# Patient Record
Sex: Female | Born: 1995
Health system: Southern US, Community
[De-identification: ages and names within clinical notes are randomized; demographics above are authoritative.]

## PROBLEM LIST (undated history)

## (undated) DIAGNOSIS — N809 Endometriosis, unspecified: Secondary | ICD-10-CM

## (undated) DIAGNOSIS — R002 Palpitations: Secondary | ICD-10-CM

## (undated) DIAGNOSIS — R0600 Dyspnea, unspecified: Secondary | ICD-10-CM

## (undated) DIAGNOSIS — J189 Pneumonia, unspecified organism: Secondary | ICD-10-CM

## (undated) DIAGNOSIS — N926 Irregular menstruation, unspecified: Secondary | ICD-10-CM

## (undated) DIAGNOSIS — Z8489 Family history of other specified conditions: Secondary | ICD-10-CM

## (undated) DIAGNOSIS — L509 Urticaria, unspecified: Secondary | ICD-10-CM

## (undated) DIAGNOSIS — R7303 Prediabetes: Secondary | ICD-10-CM

## (undated) DIAGNOSIS — K219 Gastro-esophageal reflux disease without esophagitis: Secondary | ICD-10-CM

## (undated) DIAGNOSIS — T7840XA Allergy, unspecified, initial encounter: Secondary | ICD-10-CM

## (undated) DIAGNOSIS — R519 Headache, unspecified: Secondary | ICD-10-CM

## (undated) DIAGNOSIS — R51 Headache: Secondary | ICD-10-CM

## (undated) DIAGNOSIS — D649 Anemia, unspecified: Secondary | ICD-10-CM

## (undated) HISTORY — DX: Endometriosis, unspecified: N80.9

## (undated) HISTORY — DX: Allergy, unspecified, initial encounter: T78.40XA

## (undated) HISTORY — DX: Irregular menstruation, unspecified: N92.6

## (undated) HISTORY — PX: WISDOM TOOTH EXTRACTION: SHX21

## (undated) HISTORY — DX: Headache, unspecified: R51.9

## (undated) HISTORY — DX: Headache: R51

## (undated) HISTORY — DX: Urticaria, unspecified: L50.9

## (undated) HISTORY — DX: Gastro-esophageal reflux disease without esophagitis: K21.9

---
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
## 2016-01-28 ENCOUNTER — Ambulatory Visit: Payer: 59 | Admitting: Family Medicine

## 2016-01-28 ENCOUNTER — Other Ambulatory Visit: Payer: Self-pay | Admitting: Family Medicine

## 2016-01-28 DIAGNOSIS — M2550 Pain in unspecified joint: Secondary | ICD-10-CM

## 2016-01-30 ENCOUNTER — Encounter (HOSPITAL_COMMUNITY): Payer: Self-pay | Admitting: Emergency Medicine

## 2016-01-30 ENCOUNTER — Emergency Department (HOSPITAL_COMMUNITY)
Admission: EM | Admit: 2016-01-30 | Discharge: 2016-01-31 | Disposition: A | Discharge status: Home / Self Care | Payer: Commercial Managed Care - HMO | Attending: Emergency Medicine | Admitting: Emergency Medicine

## 2016-01-30 DIAGNOSIS — M791 Myalgia: Secondary | ICD-10-CM | POA: Diagnosis not present

## 2016-01-30 DIAGNOSIS — Z5321 Procedure and treatment not carried out due to patient leaving prior to being seen by health care provider: Secondary | ICD-10-CM | POA: Insufficient documentation

## 2016-01-30 LAB — URINALYSIS, ROUTINE W REFLEX MICROSCOPIC
Bilirubin Urine: NEGATIVE
Glucose, UA: NEGATIVE mg/dL
Hgb urine dipstick: NEGATIVE
Ketones, ur: NEGATIVE mg/dL
Leukocytes, UA: NEGATIVE
Nitrite: NEGATIVE
Protein, ur: NEGATIVE mg/dL
Specific Gravity, Urine: 1.023 (ref 1.005–1.030)
pH: 6 (ref 5.0–8.0)

## 2016-01-30 LAB — COMPREHENSIVE METABOLIC PANEL
ALT: 23 U/L (ref 14–54)
AST: 24 U/L (ref 15–41)
Albumin: 4.1 g/dL (ref 3.5–5.0)
Alkaline Phosphatase: 71 U/L (ref 38–126)
Anion gap: 6 (ref 5–15)
BUN: 9 mg/dL (ref 6–20)
CO2: 23 mmol/L (ref 22–32)
Calcium: 9.6 mg/dL (ref 8.9–10.3)
Chloride: 109 mmol/L (ref 101–111)
Creatinine, Ser: 0.86 mg/dL (ref 0.44–1.00)
GFR calc Af Amer: >60 mL/min (ref >60)
GFR calc non Af Amer: >60 mL/min (ref >60)
Glucose, Bld: 91 mg/dL (ref 65–99)
Potassium: 4 mmol/L (ref 3.5–5.1)
Sodium: 138 mmol/L (ref 135–145)
Total Bilirubin: 0.4 mg/dL (ref 0.3–1.2)
Total Protein: 7.4 g/dL (ref 6.5–8.1)

## 2016-01-30 LAB — CBC WITH DIFFERENTIAL/PLATELET
Basophils Absolute: 0 10*3/uL (ref 0.0–0.1)
Basophils Relative: 0 %
Eosinophils Absolute: 0.1 10*3/uL (ref 0.0–0.7)
Eosinophils Relative: 1 %
HCT: 37.1 % (ref 36.0–46.0)
Hemoglobin: 11.5 g/dL — ABNORMAL LOW (ref 12.0–15.0)
Lymphocytes Relative: 36 %
Lymphs Abs: 3.3 10*3/uL (ref 0.7–4.0)
MCH: 25.4 pg — ABNORMAL LOW (ref 26.0–34.0)
MCHC: 31 g/dL (ref 30.0–36.0)
MCV: 82.1 fL (ref 78.0–100.0)
Monocytes Absolute: 0.7 10*3/uL (ref 0.1–1.0)
Monocytes Relative: 8 %
Neutro Abs: 5.1 10*3/uL (ref 1.7–7.7)
Neutrophils Relative %: 55 %
Platelets: 289 10*3/uL (ref 150–400)
RBC: 4.52 MIL/uL (ref 3.87–5.11)
RDW: 14.6 % (ref 11.5–15.5)
WBC: 9.2 10*3/uL (ref 4.0–10.5)

## 2016-01-30 LAB — POC URINE PREG, ED: Preg Test, Ur: NEGATIVE

## 2016-01-30 NOTE — ED Triage Notes (Signed)
Pt. reports chronic generalized body aches / joints pain for >1 year worse these past several days , denies injury ,  no fever or chills / respirations unlabored .

## 2016-01-30 NOTE — ED Notes (Signed)
Father came to desk reporting that the pt was feeling a little better and the pt just does not want to wait any longer   They will return if she gets worse

## 2016-02-01 ENCOUNTER — Encounter (HOSPITAL_COMMUNITY): Payer: Self-pay | Admitting: *Deleted

## 2016-02-04 ENCOUNTER — Other Ambulatory Visit: Payer: Self-pay | Admitting: Obstetrics and Gynecology

## 2016-02-14 ENCOUNTER — Encounter (HOSPITAL_COMMUNITY): Payer: Self-pay

## 2016-02-14 ENCOUNTER — Ambulatory Visit (HOSPITAL_COMMUNITY): Payer: Commercial Managed Care - HMO | Admitting: Certified Registered Nurse Anesthetist

## 2016-02-14 ENCOUNTER — Encounter (HOSPITAL_COMMUNITY)
Admission: RE | Disposition: A | Discharge status: Home / Self Care | Payer: Self-pay | Source: Ambulatory Visit | Attending: Obstetrics and Gynecology

## 2016-02-14 ENCOUNTER — Ambulatory Visit (HOSPITAL_COMMUNITY)
Admission: RE | Admit: 2016-02-14 | Discharge: 2016-02-14 | Disposition: A | Discharge status: Home / Self Care | Payer: Commercial Managed Care - HMO | Source: Ambulatory Visit | Attending: Obstetrics and Gynecology | Admitting: Obstetrics and Gynecology

## 2016-02-14 DIAGNOSIS — Z6841 Body Mass Index (BMI) 40.0 and over, adult: Secondary | ICD-10-CM | POA: Diagnosis not present

## 2016-02-14 DIAGNOSIS — N803 Endometriosis of pelvic peritoneum, unspecified: Secondary | ICD-10-CM

## 2016-02-14 DIAGNOSIS — K219 Gastro-esophageal reflux disease without esophagitis: Secondary | ICD-10-CM | POA: Insufficient documentation

## 2016-02-14 DIAGNOSIS — R102 Pelvic and perineal pain: Secondary | ICD-10-CM | POA: Diagnosis present

## 2016-02-14 HISTORY — PX: LAPAROSCOPY: SHX197

## 2016-02-14 LAB — CBC
HCT: 35.2 % — ABNORMAL LOW (ref 36.0–46.0)
Hemoglobin: 11.5 g/dL — ABNORMAL LOW (ref 12.0–15.0)
MCH: 25.9 pg — ABNORMAL LOW (ref 26.0–34.0)
MCHC: 32.7 g/dL (ref 30.0–36.0)
MCV: 79.3 fL (ref 78.0–100.0)
Platelets: 277 10*3/uL (ref 150–400)
RBC: 4.44 MIL/uL (ref 3.87–5.11)
RDW: 14.4 % (ref 11.5–15.5)
WBC: 7 10*3/uL (ref 4.0–10.5)

## 2016-02-14 LAB — PREGNANCY, URINE: Preg Test, Ur: NEGATIVE

## 2016-02-14 SURGERY — LAPAROSCOPY, DIAGNOSTIC
Anesthesia: General | Site: Abdomen

## 2016-02-14 MED ORDER — NEOSTIGMINE METHYLSULFATE 10 MG/10ML IV SOLN
INTRAVENOUS | Status: AC
  Filled 2016-02-14: qty 1

## 2016-02-14 MED ORDER — MIDAZOLAM HCL 2 MG/2ML IJ SOLN
INTRAMUSCULAR | Status: AC
  Filled 2016-02-14: qty 2

## 2016-02-14 MED ORDER — ONDANSETRON HCL 4 MG PO TABS: 4.0000 mg | ORAL_TABLET | Freq: Three times a day (TID) | ORAL | 0 refills | Status: DC | PRN

## 2016-02-14 MED ORDER — ROCURONIUM BROMIDE 100 MG/10ML IV SOLN
INTRAVENOUS | Status: DC | PRN
  Administered 2016-02-14: 60 mg via INTRAVENOUS

## 2016-02-14 MED ORDER — GLYCOPYRROLATE 0.2 MG/ML IJ SOLN
INTRAMUSCULAR | Status: AC
  Filled 2016-02-14: qty 3

## 2016-02-14 MED ORDER — BUPIVACAINE HCL (PF) 0.25 % IJ SOLN
INTRAMUSCULAR | Status: AC
  Filled 2016-02-14: qty 30

## 2016-02-14 MED ORDER — PROMETHAZINE HCL 25 MG/ML IJ SOLN
INTRAMUSCULAR | Status: AC
  Administered 2016-02-14: 6.25 mg via INTRAVENOUS
  Filled 2016-02-14: qty 1

## 2016-02-14 MED ORDER — ARTIFICIAL TEARS OP OINT
TOPICAL_OINTMENT | OPHTHALMIC | Status: AC
  Filled 2016-02-14: qty 3.5

## 2016-02-14 MED ORDER — FENTANYL CITRATE (PF) 250 MCG/5ML IJ SOLN
INTRAMUSCULAR | Status: AC
  Filled 2016-02-14: qty 5

## 2016-02-14 MED ORDER — SUGAMMADEX SODIUM 200 MG/2ML IV SOLN
INTRAVENOUS | Status: AC
  Filled 2016-02-14: qty 2

## 2016-02-14 MED ORDER — HYDROCODONE-ACETAMINOPHEN 5-325 MG PO TABS: 1.0000 | ORAL_TABLET | Freq: Four times a day (QID) | ORAL | 0 refills | Status: DC | PRN

## 2016-02-14 MED ORDER — DEXAMETHASONE SODIUM PHOSPHATE 10 MG/ML IJ SOLN
INTRAMUSCULAR | Status: DC | PRN
  Administered 2016-02-14: 10 mg via INTRAVENOUS

## 2016-02-14 MED ORDER — KETOROLAC TROMETHAMINE 30 MG/ML IJ SOLN
INTRAMUSCULAR | Status: DC | PRN
  Administered 2016-02-14: 30 mg via INTRAMUSCULAR
  Administered 2016-02-14: 30 mg via INTRAVENOUS

## 2016-02-14 MED ORDER — KETOROLAC TROMETHAMINE 30 MG/ML IJ SOLN
INTRAMUSCULAR | Status: AC
  Filled 2016-02-14: qty 1

## 2016-02-14 MED ORDER — LACTATED RINGERS IR SOLN
Status: DC | PRN
  Administered 2016-02-14: 3000 mL

## 2016-02-14 MED ORDER — HYDROMORPHONE HCL 1 MG/ML IJ SOLN
0.2500 mg | INTRAMUSCULAR | Status: DC | PRN
  Administered 2016-02-14: 0.5 mg via INTRAVENOUS
  Administered 2016-02-14 (×2): 0.25 mg via INTRAVENOUS
  Administered 2016-02-14: 0.5 mg via INTRAVENOUS

## 2016-02-14 MED ORDER — 0.9 % SODIUM CHLORIDE (POUR BTL) OPTIME
TOPICAL | Status: DC | PRN
  Administered 2016-02-14: 1000 mL

## 2016-02-14 MED ORDER — SCOPOLAMINE 1 MG/3DAYS TD PT72
1.0000 | MEDICATED_PATCH | Freq: Once | TRANSDERMAL | Status: DC
  Administered 2016-02-14: 1.5 mg via TRANSDERMAL

## 2016-02-14 MED ORDER — BUPIVACAINE HCL (PF) 0.25 % IJ SOLN
INTRAMUSCULAR | Status: DC | PRN
  Administered 2016-02-14: 10 mL

## 2016-02-14 MED ORDER — PROPOFOL 10 MG/ML IV BOLUS
INTRAVENOUS | Status: DC | PRN
  Administered 2016-02-14: 200 mg via INTRAVENOUS

## 2016-02-14 MED ORDER — ONDANSETRON HCL 4 MG/2ML IJ SOLN
INTRAMUSCULAR | Status: AC
  Filled 2016-02-14: qty 2

## 2016-02-14 MED ORDER — KETOROLAC TROMETHAMINE 30 MG/ML IJ SOLN: 30.0000 mg | Freq: Once | INTRAMUSCULAR | Status: DC | PRN

## 2016-02-14 MED ORDER — HYDROMORPHONE HCL 1 MG/ML IJ SOLN
INTRAMUSCULAR | Status: AC
  Filled 2016-02-14: qty 1

## 2016-02-14 MED ORDER — LIDOCAINE HCL (CARDIAC) 20 MG/ML IV SOLN
INTRAVENOUS | Status: AC
  Filled 2016-02-14: qty 5

## 2016-02-14 MED ORDER — PROPOFOL 10 MG/ML IV BOLUS
INTRAVENOUS | Status: AC
  Filled 2016-02-14: qty 20

## 2016-02-14 MED ORDER — SUGAMMADEX SODIUM 200 MG/2ML IV SOLN
INTRAVENOUS | Status: DC | PRN
  Administered 2016-02-14: 200 mg via INTRAVENOUS

## 2016-02-14 MED ORDER — MIDAZOLAM HCL 2 MG/2ML IJ SOLN
INTRAMUSCULAR | Status: DC | PRN
  Administered 2016-02-14: 2 mg via INTRAVENOUS

## 2016-02-14 MED ORDER — EPHEDRINE SULFATE 50 MG/ML IJ SOLN
INTRAMUSCULAR | Status: DC | PRN
  Administered 2016-02-14: 5 mg via INTRAVENOUS

## 2016-02-14 MED ORDER — PROMETHAZINE HCL 25 MG/ML IJ SOLN
6.2500 mg | INTRAMUSCULAR | Status: DC | PRN
  Administered 2016-02-14: 6.25 mg via INTRAVENOUS

## 2016-02-14 MED ORDER — ROCURONIUM BROMIDE 100 MG/10ML IV SOLN
INTRAVENOUS | Status: AC
  Filled 2016-02-14: qty 1

## 2016-02-14 MED ORDER — PHENYLEPHRINE HCL 10 MG/ML IJ SOLN
INTRAMUSCULAR | Status: DC | PRN
  Administered 2016-02-14: 80 ug via INTRAVENOUS
  Administered 2016-02-14: 40 ug via INTRAVENOUS

## 2016-02-14 MED ORDER — FENTANYL CITRATE (PF) 100 MCG/2ML IJ SOLN
INTRAMUSCULAR | Status: DC | PRN
  Administered 2016-02-14: 50 ug via INTRAVENOUS
  Administered 2016-02-14: 75 ug via INTRAVENOUS
  Administered 2016-02-14: 25 ug via INTRAVENOUS
  Administered 2016-02-14: 100 ug via INTRAVENOUS

## 2016-02-14 MED ORDER — ARTIFICIAL TEARS OP OINT
TOPICAL_OINTMENT | OPHTHALMIC | Status: DC | PRN
  Administered 2016-02-14: 1 via OPHTHALMIC

## 2016-02-14 MED ORDER — HYDROMORPHONE HCL 1 MG/ML IJ SOLN
INTRAMUSCULAR | Status: AC
  Administered 2016-02-14: 0.25 mg via INTRAVENOUS
  Filled 2016-02-14: qty 1

## 2016-02-14 MED ORDER — LIDOCAINE HCL (CARDIAC) 20 MG/ML IV SOLN
INTRAVENOUS | Status: DC | PRN
  Administered 2016-02-14: 60 mg via INTRAVENOUS

## 2016-02-14 MED ORDER — LACTATED RINGERS IV SOLN
INTRAVENOUS | Status: DC
  Administered 2016-02-14 (×2): 125 mL/h via INTRAVENOUS
  Administered 2016-02-14: 15:00:00 via INTRAVENOUS
  Administered 2016-02-14: 125 mL/h via INTRAVENOUS

## 2016-02-14 MED ORDER — SCOPOLAMINE 1 MG/3DAYS TD PT72
MEDICATED_PATCH | TRANSDERMAL | Status: AC
  Administered 2016-02-14: 1.5 mg via TRANSDERMAL
  Filled 2016-02-14: qty 1

## 2016-02-14 SURGICAL SUPPLY — 70 items
APPLICATOR COTTON TIP 6IN STRL (MISCELLANEOUS) ×3 IMPLANT
BARRIER ADHS 3X4 INTERCEED (GAUZE/BANDAGES/DRESSINGS) ×1 IMPLANT
BRR ADH 4X3 ABS CNTRL BYND (GAUZE/BANDAGES/DRESSINGS)
CABLE HIGH FREQUENCY MONO STRZ (ELECTRODE) ×2 IMPLANT
CATH FOLEY 3WAY  5CC 16FR (CATHETERS) ×1
CATH FOLEY 3WAY 5CC 16FR (CATHETERS) ×2 IMPLANT
CATH ROBINSON RED A/P 16FR (CATHETERS) ×3 IMPLANT
CLOTH BEACON ORANGE TIMEOUT ST (SAFETY) ×3 IMPLANT
CONT PATH 16OZ SNAP LID 3702 (MISCELLANEOUS) ×3 IMPLANT
COVER BACK TABLE 60X90IN (DRAPES) ×6 IMPLANT
COVER TIP SHEARS 8 DVNC (MISCELLANEOUS) ×1 IMPLANT
COVER TIP SHEARS 8MM DA VINCI (MISCELLANEOUS)
DECANTER SPIKE VIAL GLASS SM (MISCELLANEOUS) ×4 IMPLANT
DEFOGGER SCOPE WARMER CLEARIFY (MISCELLANEOUS) ×3 IMPLANT
DEVICE TROCAR PUNCTURE CLOSURE (ENDOMECHANICALS) ×2 IMPLANT
DRSG COVADERM PLUS 2X2 (GAUZE/BANDAGES/DRESSINGS) ×6 IMPLANT
DRSG OPSITE POSTOP 3X4 (GAUZE/BANDAGES/DRESSINGS) ×3 IMPLANT
DRSG TELFA 3X8 NADH (GAUZE/BANDAGES/DRESSINGS) ×3 IMPLANT
DURAPREP 26ML APPLICATOR (WOUND CARE) ×3 IMPLANT
ELECT LIGASURE LONG (ELECTRODE) IMPLANT
ELECT REM PT RETURN 9FT ADLT (ELECTROSURGICAL) ×3
ELECTRODE REM PT RTRN 9FT ADLT (ELECTROSURGICAL) ×2 IMPLANT
FORCEPS CUTTING 33CM 5MM (CUTTING FORCEPS) IMPLANT
FORCEPS CUTTING 45CM 5MM (CUTTING FORCEPS) IMPLANT
GAUZE VASELINE 3X9 (GAUZE/BANDAGES/DRESSINGS) IMPLANT
GLOVE BIOGEL PI IND STRL 7.0 (GLOVE) ×10 IMPLANT
GLOVE BIOGEL PI INDICATOR 7.0 (GLOVE) ×5
GLOVE ECLIPSE 6.5 STRL STRAW (GLOVE) ×9 IMPLANT
GOWN STRL REUS W/TWL LRG LVL3 (GOWN DISPOSABLE) ×9 IMPLANT
KIT ACCESSORY DA VINCI DISP (KITS) ×1
KIT ACCESSORY DVNC DISP (KITS) ×2 IMPLANT
LEGGING LITHOTOMY PAIR STRL (DRAPES) ×3 IMPLANT
LIQUID BAND (GAUZE/BANDAGES/DRESSINGS) ×3 IMPLANT
NEEDLE INSUFFLATION 120MM (ENDOMECHANICALS) ×3 IMPLANT
NS IRRIG 1000ML POUR BTL (IV SOLUTION) ×5 IMPLANT
OCCLUDER COLPOPNEUMO (BALLOONS) IMPLANT
PACK LAPAROSCOPY BASIN (CUSTOM PROCEDURE TRAY) ×3 IMPLANT
PACK ROBOT WH (CUSTOM PROCEDURE TRAY) ×3 IMPLANT
PACK ROBOTIC GOWN (GOWN DISPOSABLE) ×3 IMPLANT
PAD DRESSING TELFA 3X8 NADH (GAUZE/BANDAGES/DRESSINGS) IMPLANT
PAD PREP 24X48 CUFFED NSTRL (MISCELLANEOUS) ×6 IMPLANT
PAD TRENDELENBURG POSITION (MISCELLANEOUS) ×3 IMPLANT
SCISSORS LAP 5X35 DISP (ENDOMECHANICALS) ×2 IMPLANT
SET CYSTO W/LG BORE CLAMP LF (SET/KITS/TRAYS/PACK) IMPLANT
SET IRRIG TUBING LAPAROSCOPIC (IRRIGATION / IRRIGATOR) ×3 IMPLANT
SET TRI-LUMEN FLTR TB AIRSEAL (TUBING) ×2 IMPLANT
SLEEVE XCEL OPT CAN 5 100 (ENDOMECHANICALS) ×1 IMPLANT
SOLUTION ELECTROLUBE (MISCELLANEOUS) IMPLANT
SUT VIC AB 0 CT1 27 (SUTURE) ×15
SUT VIC AB 0 CT1 27XBRD ANTBC (SUTURE) ×10 IMPLANT
SUT VICRYL 0 UR6 27IN ABS (SUTURE) ×3 IMPLANT
SUT VICRYL 4-0 PS2 18IN ABS (SUTURE) ×4 IMPLANT
SYR 50ML LL SCALE MARK (SYRINGE) ×3 IMPLANT
SYSTEM CONVERTIBLE TROCAR (TROCAR) IMPLANT
TIP UTERINE 5.1X6CM LAV DISP (MISCELLANEOUS) IMPLANT
TIP UTERINE 6.7X10CM GRN DISP (MISCELLANEOUS) IMPLANT
TIP UTERINE 6.7X6CM WHT DISP (MISCELLANEOUS) IMPLANT
TIP UTERINE 6.7X8CM BLUE DISP (MISCELLANEOUS) IMPLANT
TOWEL OR 17X24 6PK STRL BLUE (TOWEL DISPOSABLE) ×9 IMPLANT
TROCAR BALLN 12MMX100 BLUNT (TROCAR) IMPLANT
TROCAR DISP BLADELESS 8 DVNC (TROCAR) IMPLANT
TROCAR DISP BLADELESS 8MM (TROCAR)
TROCAR OPTI TIP 12M 100M (ENDOMECHANICALS) ×1 IMPLANT
TROCAR OPTI TIP 5M 100M (ENDOMECHANICALS) ×3 IMPLANT
TROCAR PORT AIRSEAL 5X120 (TROCAR) IMPLANT
TROCAR PORT AIRSEAL 8X120 (TROCAR) ×2 IMPLANT
TROCAR XCEL DIL TIP R 11M (ENDOMECHANICALS) ×3 IMPLANT
TROCAR Z-THREAD 12X150 (TROCAR) ×1 IMPLANT
WARMER LAPAROSCOPE (MISCELLANEOUS) ×3 IMPLANT
WATER STERILE IRR 1000ML POUR (IV SOLUTION) ×3 IMPLANT

## 2016-02-14 NOTE — Anesthesia Procedure Notes (Signed)
Procedure Name: Intubation Date/Time: 02/14/2016 2:19 PM Performed by: Collier FlowersSPEIGHT, Maya Scholer J Pre-anesthesia Checklist: Patient identified, Emergency Drugs available, Timeout performed, Suction available and Patient being monitored Patient Re-evaluated:Patient Re-evaluated prior to inductionOxygen Delivery Method: Circle system utilized Preoxygenation: Pre-oxygenation with 100% oxygen Intubation Type: IV induction and Inhalational induction Ventilation: Mask ventilation without difficulty Laryngoscope Size: Mac and 3 Grade View: Grade I Tube type: Oral Tube size: 7.0 mm Number of attempts: 1 Airway Equipment and Method: Stylet Placement Confirmation: ETT inserted through vocal cords under direct vision,  breath sounds checked- equal and bilateral,  positive ETCO2 and CO2 detector Secured at: 22 cm Tube secured with: Tape Dental Injury: Teeth and Oropharynx as per pre-operative assessment

## 2016-02-14 NOTE — Discharge Instructions (Signed)
DISCHARGE INSTRUCTIONS: Laparoscopy  The following instructions have been prepared to help you care for yourself upon your return home today.  Wound care:  Do not get the incision wet for the first 24 hours. The incision should be kept clean and dry.  The Band-Aids or dressings may be removed the day after surgery.  Should the incision become sore, red, and swollen after the first week, check with your doctor.  Personal hygiene:  Shower the day after your procedure.  Activity and limitations:  Do NOT drive or operate any equipment today.  Do NOT lift anything more than 15 pounds for 2-3 weeks after surgery.  Do NOT rest in bed all day.  Walking is encouraged. Walk each day, starting slowly with 5-minute walks 3 or 4 times a day. Slowly increase the length of your walks.  Walk up and down stairs slowly.  Do NOT do strenuous activities, such as golfing, playing tennis, bowling, running, biking, weight lifting, gardening, mowing, or vacuuming for 2-4 weeks. Ask your doctor when it is okay to start.  Diet: Eat a light meal as desired this evening. You may resume your usual diet tomorrow.  Return to work: This is dependent on the type of work you do. For the most part you can return to a desk job within a week of surgery. If you are more active at work, please discuss this with your doctor.  What to expect after your surgery: You may have a slight burning sensation when you urinate on the first day. You may have a very small amount of blood in the urine. Expect to have a small amount of vaginal discharge/light bleeding for 1-2 weeks. It is not unusual to have abdominal soreness and bruising for up to 2 weeks. You may be tired and need more rest for about 1 week. You may experience shoulder pain for 24-72 hours. Lying flat in bed may relieve it.  Call your doctor for any of the following:  Develop a fever of 100.4 or greater  Inability to urinate 6 hours after discharge from hospital  Severe  pain not relieved by pain medications  Persistent of heavy bleeding at incision site  Redness or swelling around incision site after a week  Increasing nausea or vomiting  Patient Signature________________________________________ Nurse Signature_________________________________________ Post Anesthesia Home Care Instructions  Activity: Get plenty of rest for the remainder of the day. A responsible adult should stay with you for 24 hours following the procedure.  For the next 24 hours, DO NOT: -Drive a car -Operate machinery -Drink alcoholic beverages -Take any medication unless instructed by your physician -Make any legal decisions or sign important papers.  Meals: Start with liquid foods such as gelatin or soup. Progress to regular foods as tolerated. Avoid greasy, spicy, heavy foods. If nausea and/or vomiting occur, drink only clear liquids until the nausea and/or vomiting subsides. Call your physician if vomiting continues.  Special Instructions/Symptoms: Your throat may feel dry or sore from the anesthesia or the breathing tube placed in your throat during surgery. If this causes discomfort, gargle with warm salt water. The discomfort should disappear within 24 hours.  If you had a scopolamine patch placed behind your ear for the management of post- operative nausea and/or vomiting:  1. The medication in the patch is effective for 72 hours, after which it should be removed.  Wrap patch in a tissue and discard in the trash. Wash hands thoroughly with soap and water. 2. You may remove the patch   earlier than 72 hours if you experience unpleasant side effects which may include dry mouth, dizziness or visual disturbances. 3. Avoid touching the patch. Wash your hands with soap and water after contact with the patch.   

## 2016-02-14 NOTE — Anesthesia Preprocedure Evaluation (Addendum)
Anesthesia Evaluation  Patient identified by MRN, date of birth, ID band Patient awake    Reviewed: Allergy & Precautions, NPO status , Patient's Chart, lab work & pertinent test results  Airway Mallampati: II  TM Distance: >3 FB Neck ROM: Full    Dental no notable dental hx.    Pulmonary neg pulmonary ROS,    Pulmonary exam normal breath sounds clear to auscultation       Cardiovascular negative cardio ROS Normal cardiovascular exam Rhythm:Regular Rate:Normal     Neuro/Psych negative neurological ROS  negative psych ROS   GI/Hepatic Neg liver ROS, GERD  Medicated,  Endo/Other  Morbid obesity  Renal/GU negative Renal ROS  negative genitourinary   Musculoskeletal negative musculoskeletal ROS (+)   Abdominal   Peds negative pediatric ROS (+)  Hematology negative hematology ROS (+)   Anesthesia Other Findings   Reproductive/Obstetrics negative OB ROS                            Anesthesia Physical Anesthesia Plan  ASA: II  Anesthesia Plan: General   Post-op Pain Management:    Induction: Intravenous  Airway Management Planned: Oral ETT  Additional Equipment:   Intra-op Plan:   Post-operative Plan: Extubation in OR  Informed Consent: I have reviewed the patients History and Physical, chart, labs and discussed the procedure including the risks, benefits and alternatives for the proposed anesthesia with the patient or authorized representative who has indicated his/her understanding and acceptance.   Dental advisory given  Plan Discussed with: CRNA and Surgeon  Anesthesia Plan Comments:         Anesthesia Quick Evaluation

## 2016-02-14 NOTE — Brief Op Note (Signed)
02/14/2016  4:46 PM  PATIENT:  Hannah Ford  20 y.o. female  PRE-OPERATIVE DIAGNOSIS:  Pelvic Pain  POST-OPERATIVE DIAGNOSIS:  PELVIC PAIN, stage 1 Pelvic endometriosis  PROCEDURE:  Diagnostic laparoscopy, fulguration of pelvic endometriosis  SURGEON:  Surgeon(s) and Role:    * Maxie BetterSheronette Jaremy Nosal, MD - Primary  PHYSICIAN ASSISTANT:   ASSISTANTS: Arlan Organaniela Paul, CNM   ANESTHESIA:   general   Findings: nl tubes and ovaries, ant cul de sac endometriosis, appendix not seen, prominent GB, nl liver edge, Right post cul de sac ? Scarring from endometriosis  EBL:  Total I/O In: 1800 [I.V.:1800] Out: 710 [Urine:700; Blood:10]  BLOOD ADMINISTERED:none  DRAINS: none   LOCAL MEDICATIONS USED:  MARCAINE     SPECIMEN:  Source of Specimen:  bladder peritoneum endometriosis  DISPOSITION OF SPECIMEN:  PATHOLOGY  COUNTS:  YES  TOURNIQUET:  * No tourniquets in log *  DICTATION: .Other Dictation: Dictation Number [id number]  PLAN OF CARE: Discharge to home after PACU  PATIENT DISPOSITION:  PACU - guarded condition.   Delay start of Pharmacological VTE agent (>24hrs) due to surgical blood loss or risk of bleeding: no

## 2016-02-14 NOTE — Anesthesia Procedure Notes (Signed)
Central Venous Catheter Insertion Performed by: anesthesiologist Patient location: Pre-op. Preanesthetic checklist: patient identified, IV checked, site marked, risks and benefits discussed, surgical consent, monitors and equipment checked, pre-op evaluation, timeout performed and anesthesia consent Position: reverse Trendelenburg Lidocaine 1% used for infiltration Landmarks identified Catheter size: 8 Fr Central line was placed.Double lumen Procedure performed using ultrasound guided technique. Attempts: 1 Following insertion, dressing applied and line sutured. Post procedure assessment: blood return through all ports. Patient tolerated the procedure well with no immediate complications.

## 2016-02-14 NOTE — Transfer of Care (Signed)
Immediate Anesthesia Transfer of Care Note  Patient: Hannah Ford  Procedure(s) Performed: Procedure(s) with comments: LAPAROSCOPY DIAGNOSTIC FULGERATION OF PELVIC ENDOMETRIOSIS (N/A) - @ 2hrs.  Patient Location: PACU  Anesthesia Type:General  Level of Consciousness: sedated  Airway & Oxygen Therapy: Patient Spontanous Breathing and Patient connected to nasal cannula oxygen  Post-op Assessment: Report given to RN  Post vital signs: Reviewed and stable  Last Vitals:  Vitals:   02/14/16 1150  BP: (!) 144/78  Pulse: (!) 0  Resp: 20  Temp: 36.9 C    Last Pain:  Vitals:   02/14/16 1150  TempSrc: Oral  PainSc: 5       Patients Stated Pain Goal: 5 (02/14/16 1150)  Complications: No apparent anesthesia complications

## 2016-02-15 NOTE — Anesthesia Postprocedure Evaluation (Signed)
Anesthesia Post Note  Patient: Hannah Ford  Procedure(s) Performed: Procedure(s) (LRB): LAPAROSCOPY DIAGNOSTIC FULGERATION OF PELVIC ENDOMETRIOSIS (N/A)  Patient location during evaluation: PACU Anesthesia Type: General Level of consciousness: awake and alert, oriented and patient cooperative Pain management: pain level controlled (pain improving) Vital Signs Assessment: post-procedure vital signs reviewed and stable Respiratory status: spontaneous breathing, nonlabored ventilation, respiratory function stable and patient connected to nasal cannula oxygen Cardiovascular status: blood pressure returned to baseline and stable : nausea improved. Anesthetic complications: no Comments: Delayed entry: pt eval in PACU post op    Last Vitals:  Vitals:   02/14/16 1909 02/14/16 1935  BP: 136/84 (!) 141/78  Pulse: (!) 109 (!) 101  Resp: 17 16  Temp:  37.1 C    Last Pain:  Vitals:   02/15/16 1004  TempSrc:   PainSc: 6                  Ted Leonhart,E. Cadie Sorci

## 2016-02-15 NOTE — Op Note (Signed)
NAME:  Hannah Ford, Hannah                   ACCOUNT NO.:  [id number]  MEDICAL RECORD NO.:  [id number]  LOCATION:  [facility]                          FACILITY:  [facility]  PHYSICIAN:  Maxie BetterSheronette Jayleen Afonso, M.D.DATE OF BIRTH:  26-Oct-1995  DATE OF PROCEDURE:  02/14/2016 DATE OF DISCHARGE:  02/14/2016                              OPERATIVE REPORT   PREOPERATIVE DIAGNOSIS:  Pelvic pain.  PROCEDURE:  Diagnostic laparoscopy, fulguration of pelvic endometriosis.  POSTOPERATIVE DIAGNOSIS:  Pelvic pain, stage I pelvic endometriosis.  ANESTHESIA:  General.  SURGEON:  Maxie BetterSheronette Ryota Treece, M.D.  ASSISTANT:  Arlan Organaniela Paul, CNM  DESCRIPTION OF PROCEDURE:  Under adequate general anesthesia, the patient was placed in the dorsal lithotomy position.  She was sterilely prepped and draped in the usual fashion.  An indwelling Foley catheter was sterilely placed.  A small bivalve speculum was placed in the vagina.  Single-tooth tenaculum was placed on anterior lip of the cervix.  An acorn cannula was introduced in the cervix and attached to tenaculum for manipulation of the uterus.  Attention was then turned to the abdomen.  0.25% Marcaine were injected infraumbilically. Infraumbilical incision was made.  Veress needle was introduced.  2.6 L of CO2 was insufflated.  Veress needle was then removed.  A 10 mm disposable trocar with sleeve was introduced into the abdomen without incident. A lighted video laparoscope was inserted and entered the abdomen without any injury.  Panoramic inspection revealed normal liver edge.  The gallbladder was seen.  The patient was placed in Trendelenburg position.  Initial inspection appeared unremarkable. Therefore, a 2nd incision was placed suprapubically and one in the left lower quadrant.  Further inspection close to the uterus, noted for some red endometriotic appearing lesions over the anterior cul-de-sac. Posterior cul-de-sac without any lesions.  Both tubes and ovaries were normal.   Through the left lower quadrant port, water was placed with a needle and the peritoneum was injected with fluid.  Using hot scissors, a window of peritoneum with endometriotic implants was carefully removed.  Good hemostasis was noted.   The other remaining endometrial implants were fulgurated.  Once everything appeared without any incident, the suprapubic and left lower quadrant site was removed.  The infraumbilical port site was removed taking care not  to bring up any underlying structures.  The incision was closed with 4-0 Vicryl subcuticular closure and 0 Vicryl for the rectus fascia. Specimen was bladder peritoneum with endometriosis.  ESTIMATED BLOOD LOSS:  Minimal.  COMPLICATION:  None.  The patient tolerated the procedure well and was transferred to recovery in stable condition.     Maxie BetterSheronette Moriah Loughry, M.D.     [place]/MEDQ  D:  02/14/2016  T:  02/15/2016  Job:  [id number]

## 2016-02-17 ENCOUNTER — Encounter (HOSPITAL_COMMUNITY): Payer: Self-pay | Admitting: Obstetrics and Gynecology

## 2016-02-26 ENCOUNTER — Other Ambulatory Visit: Payer: Self-pay | Admitting: Neurology

## 2016-02-26 DIAGNOSIS — R29898 Other symptoms and signs involving the musculoskeletal system: Secondary | ICD-10-CM

## 2016-02-26 DIAGNOSIS — M542 Cervicalgia: Secondary | ICD-10-CM

## 2016-03-04 ENCOUNTER — Ambulatory Visit: Payer: Commercial Managed Care - HMO

## 2016-04-22 ENCOUNTER — Telehealth: Payer: Self-pay | Admitting: Family Medicine

## 2016-04-22 DIAGNOSIS — R109 Unspecified abdominal pain: Secondary | ICD-10-CM

## 2016-04-22 NOTE — Telephone Encounter (Signed)
Referral placed.

## 2016-04-22 NOTE — Telephone Encounter (Signed)
Mom called in for daughter with stomach pain.  Mom would like to get referral [facility] in [place]  [facility]. [facility].   cb number is [phone]

## 2016-04-23 ENCOUNTER — Encounter: Payer: Self-pay | Admitting: Physician Assistant

## 2016-04-28 ENCOUNTER — Encounter: Payer: Self-pay | Admitting: Physician Assistant

## 2016-04-28 ENCOUNTER — Ambulatory Visit (INDEPENDENT_AMBULATORY_CARE_PROVIDER_SITE_OTHER): Payer: Commercial Managed Care - HMO | Admitting: Physician Assistant

## 2016-04-28 VITALS — BP 112/60 | HR 96 | Ht 66.0 in | Wt 245.2 lb

## 2016-04-28 DIAGNOSIS — R1013 Epigastric pain: Secondary | ICD-10-CM | POA: Diagnosis not present

## 2016-04-28 DIAGNOSIS — K5909 Other constipation: Secondary | ICD-10-CM | POA: Diagnosis not present

## 2016-04-28 DIAGNOSIS — K625 Hemorrhage of anus and rectum: Secondary | ICD-10-CM

## 2016-04-28 DIAGNOSIS — R630 Anorexia: Secondary | ICD-10-CM | POA: Diagnosis not present

## 2016-04-28 DIAGNOSIS — R110 Nausea: Secondary | ICD-10-CM | POA: Diagnosis not present

## 2016-04-28 NOTE — Progress Notes (Signed)
Chief Complaint: Constipation, bright red blood per rectum, abdominal pain, nausea  HPI:  Hannah Ford is a very pleasant 20 year old African-American female, with past medical history of endometriosis and GERD who was referred to me by Dianne Dun, MD for a complaint of abdominal pain, constipation, bright red blood per rectum and nausea. The patient has never been seen in our clinic previously.   Today, the patient does present to clinic accompanied by her mother who does assist some of her history. The patient describes that around November to December of last year she started noticing abnormal periods and went to see a gynecologist. She was also sent to a gastroenterologist at the [facility] clinic for nausea and some epigastric pressure around that same time. The patient tells me she was diagnosed with GERD and started on Omeprazole 40 mg daily which was "too strong for me", this was then decreased to 20 mg and after a few months she started feeling better, so she stopped this earlier this year. The patient continued with some lower abdominal pain and symptoms and had an exploratory laparotomy in August of this year for suspected endometriosis which was positive. Since that time the patient has had a decreased appetite only eating 1-2 small meals per day. She believes this is mostly due to the fact that she has an epigastric abdominal pain around 30-40 minutes after she eats or drinks anything, this tends to radiate across her upper abdomen and lasts for a few hours. The patient has never tried taking any medicine during these times, she just "suffers through it". She describes this as a sharp pain rated as 8-10/10 which will turn into a cramp. Around that time she also feels as though she needs to have a bowel movement, but will sit and strain on the toilet and sometimes only produces a small thin stool. She does tell me her bowel movements have been slightly more regular over the past week or so, but are  still not normal for her. She no longer uses her Omeprazole. She has tried using MiraLAX in the past, which helped, but she has not taken this recently either. Associated symptoms included one episode of bright red blood with a bowel movement a couple of weeks ago.   Patient believes she has lost some weight, according to our flow charts patient has lost around 10 pounds since August of this year. Her mother is mainly concerned regarding her lack of appetite.   Patient has been started on birth control since time of her laporoscopy. Patient denies fever, chills, melena, fatigue, vomiting, heartburn, reflux or increase in gas or bloating.   Past Medical History:  Diagnosis Date  . Abnormal menstrual periods   . Allergy   . Frequent headaches    otc med prn  . GERD (gastroesophageal reflux disease)     Past Surgical History:  Procedure Laterality Date  . LAPAROSCOPY N/A 02/14/2016   Procedure: LAPAROSCOPY DIAGNOSTIC FULGERATION OF PELVIC ENDOMETRIOSIS;  Surgeon: Maxie Better, MD;  Location: [facility];  Service: Gynecology;  Laterality: N/A;  @ 2hrs.  . WISDOM TOOTH EXTRACTION      Current Outpatient Prescriptions  Medication Sig Dispense Refill  . Cyanocobalamin (VITAMIN B 12 PO) Take 1 capsule by mouth daily.    . Multiple Vitamin (MULTIVITAMIN) capsule Take 1 capsule by mouth daily.     No current facility-administered medications for this visit.     Allergies as of 04/28/2016 - Review Complete 04/28/2016  Allergen Reaction Noted  .  Zithromax [azithromycin] Other (See Comments) 05/21/2015  . Penicillins Rash 06/07/2011    Family History  Problem Relation Age of Onset  . Hyperlipidemia Maternal Aunt   . Heart disease Maternal Grandmother   . Hypertension Maternal Grandfather     Social History   Social History  . Marital status: Single    Spouse name: N/A  . Number of children: N/A  . Years of education: N/A   Occupational History  . Not on file.   Social History  Main Topics  . Smoking status: Never Smoker  . Smokeless tobacco: Never Used  . Alcohol use No  . Drug use: No  . Sexual activity: No   Other Topics Concern  . Not on file   Social History Narrative  . No narrative on file    Review of Systems:     Constitutional: As if her weight loss of 10 pounds since August of this year No fever, chills, weakness or fatigue HEENT: Eyes: No change in vision               Ears, Nose, Throat:  No change in hearing or congestion Skin: No rash or itching Cardiovascular: No chest pain, chest pressure or palpitations   Respiratory: No SOB or cough Gastrointestinal: See HPI and otherwise negative Genitourinary: No dysuria or change in urinary frequency Neurological: No headache, dizziness or syncope Musculoskeletal: No new muscle or joint pain Hematologic: No bruising Psychiatric: No history of depression or anxiety   Physical Exam:  Vital signs: BP 112/60   Pulse 96   Ht 5\' 6"  (1.676 m)   Wt 245 lb 3.2 oz (111.2 kg)   BMI 39.58 kg/m   General:Very pleasant young African-American female appears to be in NAD, Well developed, Well nourished, alert and cooperative Head:  Normocephalic and atraumatic. Eyes:   PEERL, EOMI. No icterus. Conjunctiva pink. Ears:  Normal auditory acuity. Neck:  Supple Throat: Oral cavity and pharynx without inflammation, swelling or lesion. Teeth in good condition. Lungs: Respirations even and unlabored. Lungs clear to auscultation bilaterally.   No wheezes, crackles, or rhonchi.  Heart: Normal S1, S2. No MRG. Regular rate and rhythm. No peripheral edema, cyanosis or pallor.  Abdomen:   obese, Soft, non-distended, moderate epigastric tenderness to palpation No rebound or guarding. Normal bowel sounds. No appreciable masses or hepatomegaly. Rectal:   external exam: Normal, no mass or lesion; internal exam: Limited as the patient was very tense and had a tight sphincter  Msk:  Symmetrical without gross deformities.  Peripheral pulses intact.  Extremities:  Without edema, no deformity or joint abnormality. Normal ROM. Neurologic:  Alert and  oriented x4;  grossly normal neurologically.  Skin:   Dry and intact without significant lesions or rashes. Psychiatric: Oriented to person, place and time. Demonstrates good judgement and reason without abnormal affect or behaviors  Most recent labs and imagingCBC    Component Value Date/Time   WBC 7.0 02/14/2016 1150   RBC 4.44 02/14/2016 1150   HGB 11.5 (L) 02/14/2016 1150   HCT 35.2 (L) 02/14/2016 1150   PLT 277 02/14/2016 1150   MCV 79.3 02/14/2016 1150   MCH 25.9 (L) 02/14/2016 1150   MCHC 32.7 02/14/2016 1150   RDW 14.4 02/14/2016 1150   LYMPHSABS 3.3 01/30/2016 2049   MONOABS 0.7 01/30/2016 2049   EOSABS 0.1 01/30/2016 2049   BASOSABS 0.0 01/30/2016 2049    CMP     Component Value Date/Time   NA 138 01/30/2016 2049  K 4.0 01/30/2016 2049   CL 109 01/30/2016 2049   CO2 23 01/30/2016 2049   GLUCOSE 91 01/30/2016 2049   BUN 9 01/30/2016 2049   CREATININE 0.86 01/30/2016 2049   CALCIUM 9.6 01/30/2016 2049   PROT 7.4 01/30/2016 2049   ALBUMIN 4.1 01/30/2016 2049   AST 24 01/30/2016 2049   ALT 23 01/30/2016 2049   ALKPHOS 71 01/30/2016 2049   BILITOT 0.4 01/30/2016 2049   GFRNONAA >60 01/30/2016 2049   GFRAA >60 01/30/2016 2049   EXAM: US ABDOMEN LIMITED - RIGHT UPPER QUADRANT 11/16/15  COMPARISON:  Abdominal pelvic CT scan of January 19, 2015  FINDINGS: Gallbladder:  No gallstones or wall thickening visualized. No sonographic Murphy sign noted by sonographer.  Common bile duct:  Diameter: 3.0 mm.  Liver:  The hepatic echotexture is mildly increased diffusely. There is no focal mass or ductal dilation. The surface contour of the liver is normal.  IMPRESSION: Probable fatty infiltrative change of the liver. No gallstones nor evidence of acute cholecystitis. If there is strong clinical concern of gallbladder  dysfunction, a nuclear medicine hepatobiliary scan would be a useful next imaging step.   Electronically Signed   By: David  SwazilandJordan M.D.   On: 11/16/2015 09:06  Assessment: 1. Epigastric abdominal pain: Occurs 30-40 minutes after eating or drinking, patient had improvement on omeprazole 20 mg in the past; likely gastritis versus GERD versus all bladder etiology, the patient did have a right upper quadrant ultrasound in the recent past which was normal, if symptoms continue and EGD was normal could consider HIDA scan for further eval 2. Nausea: Somewhat decreased since time of laparoscopic procedure, likely related to above and endometriosis 3. Constipation: Patient describes straining for thin bowel movements ever since procedure in September 4. Bright red blood per rectum: One episode, patient uncertain if this was blood or "cherry juice", with history of constipation and normal rectal exam, likely represents hemorrhoidal bleeding, discussed with the family that we cannot rule out anything more serious, but at this time would recommend for further observation 5. Decreased appetite: Since time of procedure, consider relation to epigastric pain  Plan: 1. At this time discussed conservative versus other approaches with the patient and her mother. They are willing to try conservative measures first. 2. Recommend the patient restart her Omeprazole 20 mg daily, 30-60 minutes before eating. Did discuss that she can increase this to 40 mg a day if she does not feel like this is helping very much 3. Reviewed antireflux diet and lifestyle measures. 4. Recommend the patient restart MiraLAX daily, did discuss increasing fiber and water as well as exercise on a daily basis 5. Explained to the patient and her mother that at time of follow-up in 2-3 weeks, if the patient has not seen any improvement from these conservative measures, we can consider endoscopic evaluation with an EGD plus/minus colonoscopy 6.  Patient should let us know if her symptoms increase or worsen or if she sees an increase in frequency or further bright red blood in her bowel movements 7. Patient to follow up in clinic with myself or Dr. Lavon PaganiniNandigam, as she is the supervising physician today in 2-3 weeks.   Hyacinth MeekerJennifer Felicitas Sine, PA-C  Gastroenterology 04/28/2016, 11:17 AM  Cc: Dianne DunAron, Talia M, MD

## 2016-04-28 NOTE — Patient Instructions (Addendum)
Start Miralax daily. Start the Omeprazole 20 mg daily, take 30-60 min prior to breakfast.   We have provided you with a High Fiber handout.  We made you a follow up appointment with Hyacinth MeekerJennifer Lemmon PA-C/PP Date: 05-12-2016 at 11:15 am.

## 2016-05-01 NOTE — Progress Notes (Signed)
Reviewed and agree with documentation and assessment and plan. K. Veena Nandigam , MD   

## 2016-05-02 ENCOUNTER — Telehealth: Payer: Self-pay | Admitting: Physician Assistant

## 2016-05-02 NOTE — Telephone Encounter (Signed)
Please tell patient to use MiraLax qd or BID. It sounds like she is experiencing overflow constipation. Stones are likely hard stools. Thanks-JLL

## 2016-05-02 NOTE — Telephone Encounter (Signed)
The pt is calling and states that her pain is no better and may be worse.  She says she is having some loose stools with "pieces of hard matter, some of which resembles stones"  She is taking omeprazole daily, she tried miralax but not daily as recommened.  Has an appt with Victorino DikeJennifer on 05/12/16.  Please advise

## 2016-05-02 NOTE — Telephone Encounter (Signed)
Pt has been notified of Jennifer's recommendations and will call on Monday if no better

## 2016-05-07 ENCOUNTER — Telehealth: Payer: Self-pay | Admitting: Family Medicine

## 2016-05-07 NOTE — Telephone Encounter (Signed)
Patient Name: Hannah Ford DOB: 19-Jan-1996 Initial Comment Her granddaughter is spotting, cramping and was recently diagnosed with endometrioses. She is on birth control and she feels like something is going on in the GI area. She feels like her granddaughter is depressed because no one is really helping her. Nurse Assessment Nurse: Charna Elizabethrumbull, RN, Cathy Date/Time (Eastern Time): 05/07/2016 10:47:12 AM Confirm and document reason for call. If symptomatic, describe symptoms. You must click the next button to save text entered. ---Misty StanleyLaura, Gramdmother, states [place] is not with her and she is unable to answer triage questions. She shares [place] was recently diagnosed with Endometriosis, but continues to have spotting and abdominal pain. She feels that [place] is becoming frustrated and depressed because of the ongoing symptoms. She will call [place] and have her call back for triage evaluation. Does the patient have any new or worsening symptoms? ---Yes Will a triage be completed? ---No Select reason for no triage. ---Other Guidelines Guideline Title Affirmed Question Affirmed Notes Final Disposition User Clinical Call Pagerumbull, RN, Lynden Angathy

## 2016-05-07 NOTE — Telephone Encounter (Signed)
Patient Name: Hannah Ford  DOB: 08/11/1995    Initial Comment She has been having sharp pains and swelling in her upper stomach, lack of appetite and vaginal bleeding. Every time she eats she gets a really bad pain in her stomach and she feels nauseous.    Nurse Assessment  Nurse: Annye Englisharmon, RN, Denise Date/Time (Eastern Time): 05/07/2016 1:34:09 PM  Confirm and document reason for call. If symptomatic, describe symptoms. You must click the next button to save text entered. ---Pt w/upper abd pain, recent diag GERD, seen by GI last wk, taking Omeprazole, but med not helping. Abd swollen and nauseated.  Does the patient have any new or worsening symptoms? ---Yes  Will a triage be completed? ---Yes  Related visit to physician within the last 2 weeks? ---Yes  Does the PT have any chronic conditions? (i.e. diabetes, asthma, etc.) ---Yes  List chronic conditions. ---GERD  Is the patient pregnant or possibly pregnant? (Ask all females between the ages of 4612-55) ---No  Is this a behavioral health or substance abuse call? ---No     Guidelines    Guideline Title Affirmed Question Affirmed Notes  Abdominal Pain - Upper Patient sounds very sick or weak to the triager    Final Disposition User   Go to ED Now (or PCP triage) Annye Englisharmon, RN, Angelique Blonderenise    Comments  No appts avail at the PCP office. [facility]. She states she is pain free at this time, but when she eats/drinks anything, she gets severe pain (10/10) in her upper abdomen. States she is resistent to eat/drink anything at all and has recent hx of dehydration. Advised to be seen at ER due to no appts avail at [facility], but [facility] refused [facility]   Referrals  [facility]  [facility]   Disagree/Comply: Comply

## 2016-05-12 ENCOUNTER — Ambulatory Visit (INDEPENDENT_AMBULATORY_CARE_PROVIDER_SITE_OTHER): Payer: Commercial Managed Care - HMO | Admitting: Physician Assistant

## 2016-05-12 ENCOUNTER — Encounter: Payer: Self-pay | Admitting: Physician Assistant

## 2016-05-12 VITALS — BP 120/70 | HR 78 | Ht 67.0 in | Wt 241.0 lb

## 2016-05-12 DIAGNOSIS — K5909 Other constipation: Secondary | ICD-10-CM

## 2016-05-12 DIAGNOSIS — K625 Hemorrhage of anus and rectum: Secondary | ICD-10-CM | POA: Diagnosis not present

## 2016-05-12 DIAGNOSIS — R630 Anorexia: Secondary | ICD-10-CM | POA: Diagnosis not present

## 2016-05-12 DIAGNOSIS — R110 Nausea: Secondary | ICD-10-CM | POA: Diagnosis not present

## 2016-05-12 NOTE — Progress Notes (Signed)
Chief Complaint: Abdominal pain, constipation, bright red blood per rectum and nausea  HPI:  Hannah Ford is a very pleasant 20 year old African-American female with a past medical history of endometriosis and GERD who was last seen on 04/28/2016 by me. She returns to clinic today for follow-up of her abdominal pain, constipation, bright red blood per rectum and nausea.    At that visit conservative versus more invasive measures were discussed. The patient and her mother opted for conservative measures including restarting her Omeprazole 20 mg daily as well as MiraLAX.   Today, the patient presents to clinic alone. She tells me that all of her symptoms are much better. She has not seen any further bright red blood when having a bowel movement. She does tell me she had diarrhea for about a week after seeing me and did also notice some "chunks of undigested apple" in my feces. Initially these were thought to be "stones" and the patient had called our clinic, but upon further inspection this was apple. This has also stopped happening. Patient denies any constipation, and in fact she was not constipated at time of last appointment, though she did not describe this to me. She never started her MiraLAX. Patient's epigastric pain is gone and she has also stopped her Omeprazole accordingly. She has been off Omeprazole for about a week with no further symptoms.   Patient denies fever, chills, blood in her stool, melena, change in bowel habits, fatigue, anorexia, nausea, vomiting, heartburn, reflux or abdominal pain.    Past Medical History:  Diagnosis Date  . Abnormal menstrual periods   . Allergy   . Frequent headaches    otc med prn  . GERD (gastroesophageal reflux disease)     Past Surgical History:  Procedure Laterality Date  . LAPAROSCOPY N/A 02/14/2016   Procedure: LAPAROSCOPY DIAGNOSTIC FULGERATION OF PELVIC ENDOMETRIOSIS;  Surgeon: Maxie BetterSheronette Cousins, MD;  Location: [facility];  Service: Gynecology;   Laterality: N/A;  @ 2hrs.  . WISDOM TOOTH EXTRACTION      Current Outpatient Prescriptions  Medication Sig Dispense Refill  . Cyanocobalamin (VITAMIN B 12 PO) Take 1 capsule by mouth daily.    . Multiple Vitamin (MULTIVITAMIN) capsule Take 1 capsule by mouth daily.     No current facility-administered medications for this visit.     Allergies as of 05/12/2016 - Review Complete 04/28/2016  Allergen Reaction Noted  . Zithromax [azithromycin] Other (See Comments) 05/21/2015  . Penicillins Rash 06/07/2011    Family History  Problem Relation Age of Onset  . Hyperlipidemia Maternal Aunt     x 2  . Heart disease Maternal Grandmother   . Hypertension Maternal Grandmother   . Hypertension Maternal Grandfather   . Colon cancer Neg Hx   . Esophageal cancer Neg Hx   . Stomach cancer Neg Hx   . Pancreatic cancer Neg Hx   . Liver disease Neg Hx   . Inflammatory bowel disease Neg Hx     Social History   Social History  . Marital status: Single    Spouse name: N/A  . Number of children: N/A  . Years of education: N/A   Occupational History  . Not on file.   Social History Main Topics  . Smoking status: Never Smoker  . Smokeless tobacco: Never Used  . Alcohol use No  . Drug use: No  . Sexual activity: No   Other Topics Concern  . Not on file   Social History Narrative  . No narrative  on file    Review of Systems:     Constitutional: No weight loss, fever, chills, weakness or fatigue Respiratory: No SOB Gastrointestinal: See HPI and otherwise negative   Physical Exam:  Vital signs: BP 120/70   Pulse 78   Ht 5\' 7"  (1.702 m)   Wt 241 lb (109.3 kg)   BMI 37.75 kg/m    Constitutional:   Pleasant African American female appears to be in NAD, Well developed, Well nourished, alert and cooperative Respiratory: Respirations even and unlabored. Lungs clear to auscultation bilaterally.   No wheezes, crackles, or rhonchi.  Cardiovascular: Normal S1, S2. No MRG. Regular  rate and rhythm. No peripheral edema, cyanosis or pallor.  Gastrointestinal:  Soft, nondistended, nontender. No rebound or guarding. Normal bowel sounds. No appreciable masses or hepatomegaly. Psychiatric: Demonstrates good judgement and reason without abnormal affect or behaviors.  RELEVANT LABS AND IMAGING: CBC    Component Value Date/Time   WBC 7.0 02/14/2016 1150   RBC 4.44 02/14/2016 1150   HGB 11.5 (L) 02/14/2016 1150   HCT 35.2 (L) 02/14/2016 1150   PLT 277 02/14/2016 1150   MCV 79.3 02/14/2016 1150   MCH 25.9 (L) 02/14/2016 1150   MCHC 32.7 02/14/2016 1150   RDW 14.4 02/14/2016 1150   LYMPHSABS 3.3 01/30/2016 2049   MONOABS 0.7 01/30/2016 2049   EOSABS 0.1 01/30/2016 2049   BASOSABS 0.0 01/30/2016 2049    CMP     Component Value Date/Time   NA 138 01/30/2016 2049   K 4.0 01/30/2016 2049   CL 109 01/30/2016 2049   CO2 23 01/30/2016 2049   GLUCOSE 91 01/30/2016 2049   BUN 9 01/30/2016 2049   CREATININE 0.86 01/30/2016 2049   CALCIUM 9.6 01/30/2016 2049   PROT 7.4 01/30/2016 2049   ALBUMIN 4.1 01/30/2016 2049   AST 24 01/30/2016 2049   ALT 23 01/30/2016 2049   ALKPHOS 71 01/30/2016 2049   BILITOT 0.4 01/30/2016 2049   GFRNONAA >60 01/30/2016 2049   GFRAA >60 01/30/2016 2049    Assessment: 1. Epigastric abdominal pain: No further pain after a month of omeprazole 20 mg daily, likely this represented gastritis 2. Nausea: No further nausea, with above 3. Constipation: No further constipation 4. Bright red blood per rectum: No further episodes of bright red blood per rectum 5. Decreased appetite: Patient continues with a slightly decreased appetite, but tells me that this is because she is trying to continue to lose some weight  Plan: 1. Discussed with the patient that if she develops epigastric pain in the future, she could trial Omeprazole 20 mg daily for a month, if this does not seem to control her symptoms then she should call our clinic 2. Patient to follow  with us as needed in the future.  Hyacinth MeekerJennifer Kyrsten Deleeuw, PA-C [facility] 05/12/2016, 11:18 AM  Cc: Dianne DunAron, Talia M, MD

## 2016-05-12 NOTE — Progress Notes (Signed)
Reviewed and agree with documentation and assessment and plan. K. Veena Cledith Abdou , MD   

## 2016-05-12 NOTE — Patient Instructions (Signed)
   Glad your doing better.     Follow up as needed.    I appreciate the opportunity to care for you.

## 2016-05-20 ENCOUNTER — Telehealth: Payer: Self-pay

## 2016-05-20 NOTE — Telephone Encounter (Signed)
PLEASE NOTE: All timestamps contained within this report are represented as [place] Standard Time. CONFIDENTIALTY NOTICE: This fax transmission is intended only for the addressee. It contains information that is legally privileged, confidential or otherwise protected from use or disclosure. If you are not the intended recipient, you are strictly prohibited from reviewing, disclosing, copying using or disseminating any of this information or taking any action in reliance on or regarding this information. If you have received this fax in error, please notify us immediately by telephone so that we can arrange for its return to us. Phone: [phone], Toll-Free: [phone], Fax: [phone] Page: 1 of 1 Call Id: [id number] [facility] - Client Nonclinical Telephone Record [facility] - [facility] Client Site  [facility] Day Physician Ruthe MannanAron, Talia - MD Contact Type Call Who Is Calling Patient / Member / Family / Caregiver Caller Name Hannah Ford Caller Phone Number [phone] Patient Name Hannah Ford Call Type Message Only Information Provided Reason for Call Request to Schedule Office Appointment Initial Comment Caller states that she has been having abdominal pain and back pain and she doesn't know what she should do. She is wanting to know if she can get an appointment or if she has to have a specialist. Call Closed By: Hal HopeAndrea Gray Transaction Date/Time: 05/20/2016 1:42:40 PM (ET)

## 2016-05-20 NOTE — Telephone Encounter (Signed)
Pt has appt to see Dr Dayton MartesAron on 05/21/16 at 11:45.

## 2016-05-21 ENCOUNTER — Ambulatory Visit (INDEPENDENT_AMBULATORY_CARE_PROVIDER_SITE_OTHER): Payer: Commercial Managed Care - HMO | Admitting: Family Medicine

## 2016-05-21 ENCOUNTER — Encounter: Payer: Self-pay | Admitting: Family Medicine

## 2016-05-21 VITALS — BP 120/78 | HR 117 | Temp 98.4°F | Wt 243.2 lb

## 2016-05-21 DIAGNOSIS — R319 Hematuria, unspecified: Secondary | ICD-10-CM | POA: Insufficient documentation

## 2016-05-21 DIAGNOSIS — R310 Gross hematuria: Secondary | ICD-10-CM

## 2016-05-21 DIAGNOSIS — M549 Dorsalgia, unspecified: Secondary | ICD-10-CM | POA: Insufficient documentation

## 2016-05-21 LAB — POC URINALSYSI DIPSTICK (AUTOMATED)
Bilirubin, UA: NEGATIVE
Glucose, UA: NEGATIVE
Ketones, UA: NEGATIVE
Leukocytes, UA: NEGATIVE
Nitrite, UA: NEGATIVE
Protein, UA: NEGATIVE
Spec Grav, UA: 1.01
Urobilinogen, UA: 0.2
pH, UA: 6

## 2016-05-21 NOTE — Addendum Note (Signed)
Addended by: Desmond DikeKNIGHT, Jahking Lesser H on: 05/21/2016 12:21 PM   Modules accepted: Orders

## 2016-05-21 NOTE — Progress Notes (Signed)
Pre visit review using our clinic review tool, if applicable. No additional management support is needed unless otherwise documented below in the visit note. 

## 2016-05-21 NOTE — Assessment & Plan Note (Signed)
With gross hematuria and hematuria on UA. Send urine for cx- no indication based on urine dip that this is a UTI. CT renal stone ordered. The patient indicates understanding of these issues and agrees with the plan.

## 2016-05-21 NOTE — Progress Notes (Signed)
Subjective:   Patient ID: Hannah Ford, female    DOB: 14-Mar-1996, 20 y.o.   MRN: [id number]  Hannah Ford is a pleasant 20 y.o. year old female with h/o endometriosis, GERD who presents to clinic today with her grandmother for Back Pain  on 05/21/2016  HPI:  Complains of abdominal and back pain for several days. She also had gross hematuria without dysuria yesterday.  Pain does seem to localize to left back and radiate to left suprapubic abdominal area.  Last CT of abd in 2016.   Saw GI last week- 05/12/16 for this complaint and rectal bleeding.  Note reviewed.  According to the note, her abdominal pain related to GERD had resolved with omeprazole and no further work up indicated for that or the rectal bleeding which had also resolved.  Current Outpatient Prescriptions on File Prior to Visit  Medication Sig Dispense Refill  . Cyanocobalamin (VITAMIN B 12 PO) Take 1 capsule by mouth daily.    . Multiple Vitamin (MULTIVITAMIN) capsule Take 1 capsule by mouth daily.    . norgestimate-ethinyl estradiol (SPRINTEC 28) 0.25-35 MG-MCG tablet Take 1 tablet by mouth daily.     No current facility-administered medications on file prior to visit.     Allergies  Allergen Reactions  . Zithromax [Azithromycin] Other (See Comments)    Face redness and swelling  . Penicillins Rash    Has patient had a PCN reaction causing immediate rash, facial/tongue/throat swelling, SOB or lightheadedness with hypotension: Yes Has patient had a PCN reaction causing severe rash involving mucus membranes or skin necrosis: Yes Has patient had a PCN reaction that required hospitalization No Has patient had a PCN reaction occurring within the last 10 years: No If all of the above answers are "NO", then may proceed with Cephalosporin use.     Past Medical History:  Diagnosis Date  . Abnormal menstrual periods   . Allergy   . Frequent headaches    otc med prn  . GERD (gastroesophageal reflux disease)      Past Surgical History:  Procedure Laterality Date  . LAPAROSCOPY N/A 02/14/2016   Procedure: LAPAROSCOPY DIAGNOSTIC FULGERATION OF PELVIC ENDOMETRIOSIS;  Surgeon: Maxie BetterSheronette Cousins, MD;  Location: [facility];  Service: Gynecology;  Laterality: N/A;  @ 2hrs.  . WISDOM TOOTH EXTRACTION      Family History  Problem Relation Age of Onset  . Hyperlipidemia Maternal Aunt     x 2  . Heart disease Maternal Grandmother   . Hypertension Maternal Grandmother   . Hypertension Maternal Grandfather   . Colon cancer Neg Hx   . Esophageal cancer Neg Hx   . Stomach cancer Neg Hx   . Pancreatic cancer Neg Hx   . Liver disease Neg Hx   . Inflammatory bowel disease Neg Hx     Social History   Social History  . Marital status: Single    Spouse name: N/A  . Number of children: 0  . Years of education: N/A   Occupational History  . unemployed    Social History Main Topics  . Smoking status: Never Smoker  . Smokeless tobacco: Never Used  . Alcohol use No  . Drug use: No  . Sexual activity: No   Other Topics Concern  . Not on file   Social History Narrative  . No narrative on file   The PMH, PSH, Social History, Family History, Medications, and allergies have been reviewed in [facility], and have been updated if relevant.  Review of Systems  Constitutional: Negative.   Respiratory: Negative.   Cardiovascular: Negative.   Gastrointestinal: Negative.  Negative for nausea and vomiting.  Genitourinary: Positive for flank pain. Negative for dysuria, frequency, vaginal discharge and vaginal pain.  Musculoskeletal: Positive for back pain.  Hematological: Negative.   Psychiatric/Behavioral: Negative.   All other systems reviewed and are negative.      Objective:    BP 120/78   Pulse (!) 117   Temp 98.4 F (36.9 C) (Oral)   Wt 243 lb 4 oz (110.3 kg)   SpO2 99%   BMI 38.10 kg/m    Physical Exam  Constitutional: She is oriented to person, place, and time. She appears  well-developed and well-nourished. No distress.  HENT:  Head: Normocephalic and atraumatic.  Eyes: Conjunctivae are normal.  Cardiovascular: Normal rate.   Pulmonary/Chest: Effort normal.  Abdominal: Soft. She exhibits no distension. There is no tenderness. There is no rebound and no guarding.  Musculoskeletal: Normal range of motion.  Neurological: She is alert and oriented to person, place, and time. No cranial nerve deficit.  Skin: Skin is warm and dry. She is not diaphoretic.  Psychiatric: She has a normal mood and affect. Her behavior is normal. Judgment and thought content normal.  Nursing note and vitals reviewed.         Assessment & Plan:   Hematuria, unspecified type - Plan: POCT Urinalysis Dipstick (Automated)  Gross hematuria - Plan: CT RENAL STONE STUDY  Back pain, unspecified back location, unspecified back pain laterality, unspecified chronicity - Plan: CT RENAL STONE STUDY No Follow-up on file.

## 2016-05-21 NOTE — Patient Instructions (Signed)
Great to see you. Please stop by to see Hannah Ford on your way out.   

## 2016-05-22 ENCOUNTER — Emergency Department
Admission: EM | Admit: 2016-05-22 | Discharge: 2016-05-22 | Disposition: A | Discharge status: Home / Self Care | Payer: Commercial Managed Care - HMO | Attending: Emergency Medicine | Admitting: Emergency Medicine

## 2016-05-22 ENCOUNTER — Encounter: Payer: Self-pay | Admitting: Emergency Medicine

## 2016-05-22 DIAGNOSIS — M545 Low back pain, unspecified: Secondary | ICD-10-CM

## 2016-05-22 LAB — BASIC METABOLIC PANEL
Anion gap: 8 (ref 5–15)
BUN: 6 mg/dL (ref 6–20)
CO2: 23 mmol/L (ref 22–32)
Calcium: 9.4 mg/dL (ref 8.9–10.3)
Chloride: 108 mmol/L (ref 101–111)
Creatinine, Ser: 0.91 mg/dL (ref 0.44–1.00)
GFR calc Af Amer: >60 mL/min (ref >60)
GFR calc non Af Amer: >60 mL/min (ref >60)
Glucose, Bld: 107 mg/dL — ABNORMAL HIGH (ref 65–99)
Potassium: 3.8 mmol/L (ref 3.5–5.1)
Sodium: 139 mmol/L (ref 135–145)

## 2016-05-22 LAB — URINALYSIS, COMPLETE (UACMP) WITH MICROSCOPIC
Bilirubin Urine: NEGATIVE
Glucose, UA: NEGATIVE mg/dL
Ketones, ur: NEGATIVE mg/dL
Leukocytes, UA: NEGATIVE
Nitrite: NEGATIVE
Protein, ur: NEGATIVE mg/dL
Specific Gravity, Urine: 1.012 (ref 1.005–1.030)
pH: 6 (ref 5.0–8.0)

## 2016-05-22 LAB — CBC
HCT: 37.3 % (ref 35.0–47.0)
Hemoglobin: 12.2 g/dL (ref 12.0–16.0)
MCH: 26.6 pg (ref 26.0–34.0)
MCHC: 32.7 g/dL (ref 32.0–36.0)
MCV: 81.3 fL (ref 80.0–100.0)
Platelets: 219 10*3/uL (ref 150–440)
RBC: 4.59 MIL/uL (ref 3.80–5.20)
RDW: 14.7 % — ABNORMAL HIGH (ref 11.5–14.5)
WBC: 5.7 10*3/uL (ref 3.6–11.0)

## 2016-05-22 LAB — POCT PREGNANCY, URINE: Preg Test, Ur: NEGATIVE

## 2016-05-22 LAB — URINE CULTURE: Organism ID, Bacteria: NO GROWTH

## 2016-05-22 MED ORDER — IBUPROFEN 800 MG PO TABS
800.0000 mg | ORAL_TABLET | Freq: Once | ORAL | Status: AC
  Administered 2016-05-22: 800 mg via ORAL
  Filled 2016-05-22: qty 1

## 2016-05-22 NOTE — Discharge Instructions (Signed)
For your pain, you may alternate Tylenol and Motrin. For your height and weight, you may take up to 1000 mg of Tylenol every 8 hours, and 800 mg of Motrin every 8 hours as needed. Please take Motrin with food.  You may also use a heating pad for 10 minutes every 2 hours.  Return to the emergency department if you develop severe pain, fever, fainting, or any other symptoms concerning to you.

## 2016-05-22 NOTE — ED Provider Notes (Signed)
[facility] Emergency Department Provider Note  ____________________________________________  Time seen: Approximately 2:19 PM  I have reviewed the triage vital signs and the nursing notes.   HISTORY  Chief Complaint Flank Pain    HPI Green[place] B Azucena KubaReid is a 20 y.o. female with obesity, history of endometriosis, presenting with low back pain. The patient reports that for the last several months she has had a dull ache in the low back that is constant. Occasionally she has exacerbations but does not attribute this to exertion, lifting.  It is not associated with dysuria, frequency, sharp shooting pains into the legs. She denies any hematuria, fever or chills. No nausea or vomiting. No diarrhea. She has tried 500 mg of Tylenol and a heating pad with only minimal relief. She has seen her primary care physician for this same pain, and is scheduled for CT renal stone protocol tomorrow. Today the pain became "unbearable" but is unchanged in character.  No longer menstruates that she takes oral contraceptive pills without breakthrough bleed for her endometriosis.  FH: Positive for endometriosis and renal stones in her mother.  Past Medical History:  Diagnosis Date  . Abnormal menstrual periods   . Allergy   . Frequent headaches    otc med prn  . GERD (gastroesophageal reflux disease)     Patient Active Problem List   Diagnosis Date Noted  . Hematuria 05/21/2016  . Back pain 05/21/2016  . Polyarthralgia 11/19/2015  . Chest pain 11/19/2015  . GERD (gastroesophageal reflux disease) 11/19/2015  . Arthralgia 11/14/2015  . Menorrhagia 02/20/2015    Past Surgical History:  Procedure Laterality Date  . LAPAROSCOPY N/A 02/14/2016   Procedure: LAPAROSCOPY DIAGNOSTIC FULGERATION OF PELVIC ENDOMETRIOSIS;  Surgeon: Maxie BetterSheronette Cousins, MD;  Location: [facility];  Service: Gynecology;  Laterality: N/A;  @ 2hrs.  . WISDOM TOOTH EXTRACTION      Current Outpatient Rx  . Order #:  [id number] Class: Historical Med  . Order #: [id number] Class: Historical Med  . Order #: [id number] Class: Historical Med    Allergies Zithromax [azithromycin] and Penicillins  Family History  Problem Relation Age of Onset  . Hyperlipidemia Maternal Aunt     x 2  . Heart disease Maternal Grandmother   . Hypertension Maternal Grandmother   . Hypertension Maternal Grandfather   . Colon cancer Neg Hx   . Esophageal cancer Neg Hx   . Stomach cancer Neg Hx   . Pancreatic cancer Neg Hx   . Liver disease Neg Hx   . Inflammatory bowel disease Neg Hx     Social History Social History  Substance Use Topics  . Smoking status: Never Smoker  . Smokeless tobacco: Never Used  . Alcohol use No    Review of Systems Constitutional: No fever/chills.No lightheadedness or syncope. Eyes: No visual changes. ENT: No sore throat. No congestion or rhinorrhea. Cardiovascular: Denies chest pain. Denies palpitations. Respiratory: Denies shortness of breath.  No cough. Gastrointestinal: No abdominal pain.  No nausea, no vomiting.  No diarrhea.  No constipation. Genitourinary: Negative for dysuria. No hematuria. Musculoskeletal: Positive for bilateral low back pain. Skin: Negative for rash. Neurological: Negative for headaches. No focal numbness, tingling or weakness.   10-point ROS otherwise negative.  ____________________________________________   PHYSICAL EXAM:  VITAL SIGNS: ED Triage Vitals  Enc Vitals Group     BP 05/22/16 1228 123/68     Pulse Rate 05/22/16 1228 84     Resp 05/22/16 1228 18     Temp 05/22/16 1228 98.8  F (37.1 C)     Temp Source 05/22/16 1228 Oral     SpO2 05/22/16 1228 100 %     Weight 05/22/16 1228 245 lb (111.1 kg)     Height 05/22/16 1228 5\' 7"  (1.702 m)     Head Circumference --      Peak Flow --      Pain Score 05/22/16 1235 9     Pain Loc --      Pain Edu? --      Excl. in GC? --     Constitutional: Alert and oriented. Well appearing and in no acute  distress. Answers questions appropriately. Eyes: Conjunctivae are normal.  EOMI. No scleral icterus. Head: Atraumatic. Nose: No congestion/rhinnorhea. Mouth/Throat: Mucous membranes are moist.  Neck: No stridor.  Supple.   Cardiovascular: Normal rate, regular rhythm. No murmurs, rubs or gallops.  Respiratory: Normal respiratory effort.  No accessory muscle use or retractions. Lungs CTAB.  No wheezes, rales or ronchi. Gastrointestinal: Overweight. Soft, nontender and nondistended.  No guarding or rebound.  No peritoneal signs. Musculoskeletal: No LE edema. Tenderness to palpation bilaterally, not in the midline, over the iliac crests in the back without focality. No overlying skin changes. Neurologic:  A&Ox3.  Speech is clear.  Face and smile are symmetric.  EOMI.  Moves all extremities well. Normal gait without ataxia or significant discomfort. Skin:  Skin is warm, dry and intact. No rash noted. Psychiatric: Mood and affect are normal. Speech and behavior are normal.  Normal judgement.  ____________________________________________   LABS (all labs ordered are listed, but only abnormal results are displayed)  Labs Reviewed  URINALYSIS, COMPLETE (UACMP) WITH MICROSCOPIC - Abnormal; Notable for the following:       Result Value   Color, Urine YELLOW (*)    APPearance HAZY (*)    Hgb urine dipstick LARGE (*)    Bacteria, UA FEW (*)    Squamous Epithelial / LPF 0-5 (*)    All other components within normal limits  BASIC METABOLIC PANEL - Abnormal; Notable for the following:    Glucose, Bld 107 (*)    All other components within normal limits  CBC - Abnormal; Notable for the following:    RDW 14.7 (*)    All other components within normal limits  POC URINE PREG, ED  POCT PREGNANCY, URINE   ____________________________________________  EKG  Not indicated ____________________________________________  RADIOLOGY  No results  found.  ____________________________________________   PROCEDURES  Procedure(s) performed: None  Procedures  Critical Care performed: No ____________________________________________   INITIAL IMPRESSION / ASSESSMENT AND PLAN / ED COURSE  Pertinent labs & imaging results that were available during my care of the patient were reviewed by me and considered in my medical decision making (see chart for details).  20 y.o. female with several months of bilateral low back pain. At that her primary care physician's office and here, her urine dip has hemoglobin, but her red blood cell count is only 0-5, and there is no evidence of infection in her urine. This patient's symptoms are not consistent with acute renal colic, pyelonephritis or UTI. I have talked to her about multiple possible etiologies, including strain in her back from being overweight, musculoskeletal strain. I do think she should proceed with the CT imaging scheduled by her primary care physician. Given that her symptoms have been going on for so long and are grossly unchanged, will treat her symptomatically here now that her vital signs, examination, laboratory studies are reassuring. She  will follow-up with her primary care physician after her imaging. She understands return precautions as well as follow-up instructions.  ____________________________________________  FINAL CLINICAL IMPRESSION(S) / ED DIAGNOSES  Final diagnoses:  Acute bilateral low back pain without sciatica    Clinical Course       NEW MEDICATIONS STARTED DURING THIS VISIT:  New Prescriptions   No medications on file      Rockne MenghiniAnne-Caroline Clariza Sickman, MD 05/22/16 1425

## 2016-05-22 NOTE — ED Notes (Signed)
Pt educated on rechecking blood pressure due to sl elevation.  I told her to ask at her appt tomorrow to have checked again and discussed normal parameters.

## 2016-05-22 NOTE — ED Triage Notes (Signed)
Pt comes into the ED via POV c/o bilateral flank pain.  Patient states this has been going on for over a month but it has increasingly gotten worse.  Patient was scheduled for a CT tomorrow but the patient is unable to tolerate the pain until then.  Denies any h/o kidney stones or recurrent UTI's.   Denies any frequent urination or discomfort when urinating.

## 2016-05-23 ENCOUNTER — Ambulatory Visit (INDEPENDENT_AMBULATORY_CARE_PROVIDER_SITE_OTHER)
Admission: RE | Admit: 2016-05-23 | Discharge: 2016-05-23 | Disposition: A | Discharge status: Home / Self Care | Payer: Commercial Managed Care - HMO | Source: Ambulatory Visit | Attending: Family Medicine | Admitting: Family Medicine

## 2016-05-23 DIAGNOSIS — M549 Dorsalgia, unspecified: Secondary | ICD-10-CM

## 2016-05-23 DIAGNOSIS — R310 Gross hematuria: Secondary | ICD-10-CM | POA: Diagnosis not present

## 2016-05-26 ENCOUNTER — Encounter: Payer: Self-pay | Admitting: Family Medicine

## 2016-06-23 ENCOUNTER — Encounter: Payer: Self-pay | Admitting: Family Medicine

## 2016-07-01 ENCOUNTER — Telehealth: Payer: Self-pay | Admitting: Physician Assistant

## 2016-07-04 NOTE — Telephone Encounter (Signed)
Left message on machine to call back  

## 2016-07-10 ENCOUNTER — Encounter: Payer: Self-pay | Admitting: Family Medicine

## 2016-07-10 NOTE — Telephone Encounter (Signed)
Left message with family  Member to return call

## 2016-07-24 DIAGNOSIS — G43511 Persistent migraine aura without cerebral infarction, intractable, with status migrainosus: Secondary | ICD-10-CM | POA: Diagnosis not present

## 2016-09-03 ENCOUNTER — Telehealth: Payer: Self-pay

## 2016-09-03 DIAGNOSIS — R1084 Generalized abdominal pain: Secondary | ICD-10-CM

## 2016-09-03 NOTE — Telephone Encounter (Signed)
pts mom left v/m requesting (DPR signed) referral; pt still experiencing stomach pains; Dr Dayton MartesAron had previously advised pt to go to GI but pts mom did not feel like GI dr  helped to decide what is causing stomach pain. [facility] GI [facility]; if Dr Dayton MartesAron will do pts mom will bring necessary paperwork for [facility]pts mom wants pt to see Dr L[facility]li DollarMillie Long [facility].

## 2016-09-04 NOTE — Telephone Encounter (Signed)
Referral placed.

## 2016-09-08 ENCOUNTER — Telehealth: Payer: Self-pay | Admitting: Family Medicine

## 2016-09-08 NOTE — Telephone Encounter (Signed)
Hannah Ford, can you please call pt to discuss the status of the referral. Thanks

## 2016-09-08 NOTE — Telephone Encounter (Signed)
Pt mother, Hannah Ford, called to discuss a referral for [facility]. She has been to [facility] to see Dr. Jari FavreLemmons but has been unhappy with the care and feels she is not listening to her and her symptoms. She doesn't feel like it is GERD. Pt is losing weight and feels down because when she eats it causes her to lay in bed in pain. Please advise.

## 2016-09-09 NOTE — Telephone Encounter (Signed)
Thank you :)

## 2016-09-09 NOTE — Telephone Encounter (Signed)
Called patients mother Judeth CornfieldStephanie, filled out [facility] referral form and [facility]. Patients mother aware that they will be called [facility] with the Appt info.

## 2016-09-15 ENCOUNTER — Ambulatory Visit: Payer: Self-pay | Admitting: Family Medicine

## 2016-09-24 ENCOUNTER — Ambulatory Visit (INDEPENDENT_AMBULATORY_CARE_PROVIDER_SITE_OTHER): Payer: Commercial Managed Care - HMO | Admitting: Family Medicine

## 2016-09-24 DIAGNOSIS — J358 Other chronic diseases of tonsils and adenoids: Secondary | ICD-10-CM | POA: Insufficient documentation

## 2016-09-24 NOTE — Progress Notes (Signed)
Subjective:   Patient ID: Hannah Ford, female    DOB: 1996/05/31, 21 y.o.   MRN: [id number]  Hannah Ford is a pleasant 21 y.o. year old female who presents to clinic today with Acute Visit (very sore espically while eating-- Large pustles -- ? tonsil stones)  on 09/24/2016  HPI:  Tonsil stones- for months, she has big white chunks in her tonsils that she can see and pull out. Also uses a water pic.  They smell bad.  She worries her breath smells too.  Has also had more sore throats lately and wonders if this is related.  More nasal drainage.  No current outpatient prescriptions on file prior to visit.   No current facility-administered medications on file prior to visit.     Allergies  Allergen Reactions  . Zithromax [Azithromycin] Other (See Comments)    Face redness and swelling  . Penicillins Rash    Has patient had a PCN reaction causing immediate rash, facial/tongue/throat swelling, SOB or lightheadedness with hypotension: Yes Has patient had a PCN reaction causing severe rash involving mucus membranes or skin necrosis: Yes Has patient had a PCN reaction that required hospitalization No Has patient had a PCN reaction occurring within the last 10 years: No If all of the above answers are "NO", then may proceed with Cephalosporin use.     Past Medical History:  Diagnosis Date  . Abnormal menstrual periods   . Allergy   . Frequent headaches    otc med prn  . GERD (gastroesophageal reflux disease)     Past Surgical History:  Procedure Laterality Date  . LAPAROSCOPY N/A 02/14/2016   Procedure: LAPAROSCOPY DIAGNOSTIC FULGERATION OF PELVIC ENDOMETRIOSIS;  Surgeon: Maxie Better, MD;  Location: [facility];  Service: Gynecology;  Laterality: N/A;  @ 2hrs.  . WISDOM TOOTH EXTRACTION      Family History  Problem Relation Age of Onset  . Hyperlipidemia Maternal Aunt     x 2  . Heart disease Maternal Grandmother   . Hypertension Maternal Grandmother   . Hypertension  Maternal Grandfather   . Colon cancer Neg Hx   . Esophageal cancer Neg Hx   . Stomach cancer Neg Hx   . Pancreatic cancer Neg Hx   . Liver disease Neg Hx   . Inflammatory bowel disease Neg Hx     Social History   Social History  . Marital status: Single    Spouse name: N/A  . Number of children: 0  . Years of education: N/A   Occupational History  . unemployed    Social History Main Topics  . Smoking status: Never Smoker  . Smokeless tobacco: Never Used  . Alcohol use No  . Drug use: No  . Sexual activity: No   Other Topics Concern  . Not on file   Social History Narrative  . No narrative on file   The PMH, PSH, Social History, Family History, Medications, and allergies have been reviewed in [facility], [facility], [facility], and have been updated if relevant.   Review of Systems  Constitutional: Negative.   HENT: Positive for postnasal drip and sore throat. Negative for mouth sores, sinus pain, sinus pressure, tinnitus, trouble swallowing and voice change.   All other systems reviewed and are negative.      Objective:    BP 122/78   Pulse 89   Temp 98.4 F (36.9 C)   Wt 227 lb (103 kg)   SpO2 99%   BMI 35.55 kg/m  Physical Exam  Constitutional: She appears well-developed and well-nourished. No distress.  HENT:  Head: Normocephalic and atraumatic.  Right Ear: Hearing and tympanic membrane normal.  Left Ear: Hearing and tympanic membrane normal.  Mouth/Throat: Uvula is midline and oropharynx is clear and moist. No oropharyngeal exudate, posterior oropharyngeal edema or posterior oropharyngeal erythema.  Eyes: Conjunctivae are normal.  Skin: She is not diaphoretic.  Nursing note and vitals reviewed.         Assessment & Plan:   Tonsil stone - Plan: Ambulatory referral to ENT No Follow-up on file.

## 2016-09-24 NOTE — Assessment & Plan Note (Signed)
No stones or other abnormalities on exam. Will refer to ENT for further evaluation. The patient indicates understanding of these issues and agrees with the plan.

## 2016-09-24 NOTE — Patient Instructions (Signed)
Good to see you. Please stop by to see Marion on your way out. 

## 2016-10-21 DIAGNOSIS — J358 Other chronic diseases of tonsils and adenoids: Secondary | ICD-10-CM | POA: Diagnosis not present

## 2016-10-21 DIAGNOSIS — J343 Hypertrophy of nasal turbinates: Secondary | ICD-10-CM | POA: Diagnosis not present

## 2016-11-09 ENCOUNTER — Encounter (HOSPITAL_COMMUNITY): Payer: Self-pay

## 2016-11-09 ENCOUNTER — Ambulatory Visit (HOSPITAL_COMMUNITY)
Admission: EM | Admit: 2016-11-09 | Discharge: 2016-11-09 | Disposition: A | Discharge status: Nursing Home (Includes ICF) | Payer: Commercial Managed Care - HMO | Attending: Family Medicine | Admitting: Family Medicine

## 2016-11-09 ENCOUNTER — Encounter (HOSPITAL_COMMUNITY): Payer: Self-pay | Admitting: Emergency Medicine

## 2016-11-09 ENCOUNTER — Emergency Department (HOSPITAL_COMMUNITY)
Admission: EM | Admit: 2016-11-09 | Discharge: 2016-11-09 | Disposition: A | Discharge status: Home / Self Care | Payer: Commercial Managed Care - HMO | Attending: Emergency Medicine | Admitting: Emergency Medicine

## 2016-11-09 ENCOUNTER — Emergency Department (HOSPITAL_COMMUNITY): Payer: Commercial Managed Care - HMO

## 2016-11-09 DIAGNOSIS — R0789 Other chest pain: Secondary | ICD-10-CM

## 2016-11-09 DIAGNOSIS — R079 Chest pain, unspecified: Secondary | ICD-10-CM | POA: Diagnosis not present

## 2016-11-09 DIAGNOSIS — K2100 Gastro-esophageal reflux disease with esophagitis, without bleeding: Secondary | ICD-10-CM

## 2016-11-09 DIAGNOSIS — R0602 Shortness of breath: Secondary | ICD-10-CM | POA: Diagnosis not present

## 2016-11-09 DIAGNOSIS — R072 Precordial pain: Secondary | ICD-10-CM | POA: Insufficient documentation

## 2016-11-09 LAB — BASIC METABOLIC PANEL
Anion gap: 12 (ref 5–15)
BUN: 5 mg/dL — ABNORMAL LOW (ref 6–20)
CO2: 19 mmol/L — ABNORMAL LOW (ref 22–32)
Calcium: 10.4 mg/dL — ABNORMAL HIGH (ref 8.9–10.3)
Chloride: 108 mmol/L (ref 101–111)
Creatinine, Ser: 0.88 mg/dL (ref 0.44–1.00)
GFR calc Af Amer: >60 mL/min (ref >60)
GFR calc non Af Amer: >60 mL/min (ref >60)
Glucose, Bld: 97 mg/dL (ref 65–99)
Potassium: 3.4 mmol/L — ABNORMAL LOW (ref 3.5–5.1)
Sodium: 139 mmol/L (ref 135–145)

## 2016-11-09 LAB — CBC
HCT: 41 % (ref 36.0–46.0)
Hemoglobin: 13.4 g/dL (ref 12.0–15.0)
MCH: 26.7 pg (ref 26.0–34.0)
MCHC: 32.7 g/dL (ref 30.0–36.0)
MCV: 81.8 fL (ref 78.0–100.0)
Platelets: 235 10*3/uL (ref 150–400)
RBC: 5.01 MIL/uL (ref 3.87–5.11)
RDW: 13.6 % (ref 11.5–15.5)
WBC: 7.5 10*3/uL (ref 4.0–10.5)

## 2016-11-09 LAB — I-STAT TROPONIN, ED: Troponin i, poc: 0 ng/mL (ref 0.00–0.08)

## 2016-11-09 LAB — D-DIMER, QUANTITATIVE: D-Dimer, Quant: 0.27 ug/mL-FEU (ref 0.00–0.50)

## 2016-11-09 MED ORDER — FAMOTIDINE 20 MG PO TABS
20.0000 mg | ORAL_TABLET | Freq: Once | ORAL | Status: AC
  Administered 2016-11-09: 20 mg via ORAL
  Filled 2016-11-09: qty 1

## 2016-11-09 MED ORDER — ACETAMINOPHEN 500 MG PO TABS
1000.0000 mg | ORAL_TABLET | Freq: Once | ORAL | Status: AC
  Administered 2016-11-09: 1000 mg via ORAL
  Filled 2016-11-09: qty 2

## 2016-11-09 MED ORDER — ALUM & MAG HYDROXIDE-SIMETH 200-200-20 MG/5ML PO SUSP
15.0000 mL | Freq: Once | ORAL | Status: AC
  Administered 2016-11-09: 15 mL via ORAL
  Filled 2016-11-09: qty 30

## 2016-11-09 MED ORDER — PANTOPRAZOLE SODIUM 40 MG PO TBEC: 40.0000 mg | DELAYED_RELEASE_TABLET | Freq: Every day | ORAL | 0 refills | Status: DC

## 2016-11-09 MED ORDER — POTASSIUM CHLORIDE CRYS ER 20 MEQ PO TBCR: 20.0000 meq | EXTENDED_RELEASE_TABLET | Freq: Once | ORAL | Status: DC

## 2016-11-09 NOTE — ED Provider Notes (Signed)
[facility]    CSN: [id number] Arrival date & time: 11/09/16  1745     History   Chief Complaint Chief Complaint  Patient presents with  . Chest Pain    HPI Hannah Ford is a 21 y.o. female.   Pt c/o intermittent CP onset this am associated w/dizziness, numbness/tingly of left arm, left shoulder/neck pain, nauseas  Patient has had this kind of problem in the past and she's also had a panic attack. Nevertheless she's never had a scan for pulmonary emboli and she does have left leg pain at this point.  Patient's initial pain happened at about 1 AM this morning. She had trouble sleeping after the pain began but she didn't was able eventually to doze off. She had another episode at 6 AM and again at 8 AM and has continued to have chest pressure, left arm pain, shortness of breath, nausea, and leg pain all day.       Past Medical History:  Diagnosis Date  . Abnormal menstrual periods   . Allergy   . Frequent headaches    otc med prn  . GERD (gastroesophageal reflux disease)     Patient Active Problem List   Diagnosis Date Noted  . Tonsil stone 09/24/2016  . Hematuria 05/21/2016  . Back pain 05/21/2016  . Polyarthralgia 11/19/2015  . Chest pain 11/19/2015  . GERD (gastroesophageal reflux disease) 11/19/2015  . Arthralgia 11/14/2015  . Menorrhagia 02/20/2015    Past Surgical History:  Procedure Laterality Date  . LAPAROSCOPY N/A 02/14/2016   Procedure: LAPAROSCOPY DIAGNOSTIC FULGERATION OF PELVIC ENDOMETRIOSIS;  Surgeon: Maxie BetterSheronette Cousins, MD;  Location: [facility];  Service: Gynecology;  Laterality: N/A;  @ 2hrs.  . WISDOM TOOTH EXTRACTION      OB History    No data available       Home Medications    Prior to Admission medications   Not on File    Family History Family History  Problem Relation Age of Onset  . Hyperlipidemia Maternal Aunt        x 2  . Heart disease Maternal Grandmother   . Hypertension Maternal Grandmother   .  Hypertension Maternal Grandfather   . Colon cancer Neg Hx   . Esophageal cancer Neg Hx   . Stomach cancer Neg Hx   . Pancreatic cancer Neg Hx   . Liver disease Neg Hx   . Inflammatory bowel disease Neg Hx     Social History Social History  Substance Use Topics  . Smoking status: Never Smoker  . Smokeless tobacco: Never Used  . Alcohol use No     Allergies   Zithromax [azithromycin] and Penicillins   Review of Systems Review of Systems  Constitutional: Negative.   HENT: Negative.   Respiratory: Positive for chest tightness and shortness of breath.   Cardiovascular: Positive for chest pain. Negative for leg swelling.  All other systems reviewed and are negative.    Physical Exam Triage Vital Signs ED Triage Vitals  Enc Vitals Group     BP 11/09/16 1807 136/86     Pulse Rate 11/09/16 1807 (!) 116     Resp 11/09/16 1807 20     Temp 11/09/16 1807 99.5 F (37.5 C)     Temp Source 11/09/16 1807 Oral     SpO2 11/09/16 1807 100 %     Weight --      Height --      Head Circumference --  Peak Flow --      Pain Score 11/09/16 1808 10     Pain Loc --      Pain Edu? --      Excl. in GC? --    No data found.   Updated Vital Signs BP 136/86 (BP Location: Left Arm)   Pulse (!) 116   Temp 99.5 F (37.5 C) (Oral)   Resp 20   LMP 10/18/2016   SpO2 100%    Physical Exam  Constitutional: She is oriented to person, place, and time. She appears well-developed and well-nourished.  HENT:  Right Ear: External ear normal.  Left Ear: External ear normal.  Mouth/Throat: Oropharynx is clear and moist.  Eyes: Conjunctivae and EOM are normal. Pupils are equal, round, and reactive to light.  Neck: Normal range of motion. Neck supple.  Cardiovascular: Regular rhythm and normal heart sounds.   Pulmonary/Chest: Effort normal and breath sounds normal.  Abdominal: Soft. Bowel sounds are normal.  Musculoskeletal: Normal range of motion.  Neurological: She is alert and  oriented to person, place, and time.  Skin: Skin is warm and dry.  Psychiatric:  Somewhat histrionic affect.  Nursing note and vitals reviewed.    UC Treatments / Results  Labs (all labs ordered are listed, but only abnormal results are displayed) Labs Reviewed - No data to display  EKG S wave in lead 1, Q wave in lead 3.  Radiology No results found.  Procedures Procedures (including critical care time)  Medications Ordered in UC Medications - No data to display   Initial Impression / Assessment and Plan / UC Course  I have reviewed the triage vital signs and the nursing notes.  Pertinent labs & imaging results that were available during my care of the patient were reviewed by me and considered in my medical decision making (see chart for details).     Final Clinical Impressions(s) / UC Diagnoses   Final diagnoses:  Atypical chest pain    New Prescriptions New Prescriptions   No medications on file  Transfer to emergency department for evaluation for pulmonary embolus.   Elvina Sidle, MD 11/09/16 [phone]

## 2016-11-09 NOTE — ED Provider Notes (Signed)
[facility]   CSN: [id number] Arrival date & time: 11/09/16  1907     History   Chief Complaint Chief Complaint  Patient presents with  . Chest Pain  . Shortness of Breath    HPI [place] B Wimes is a 21 y.o. female.  Patient c/o pain in chest since last night, onset at rest. In midline, lower sternal area. Constant, dull, non radiating. No back or flank pain. Denies hx same pain. No recent exertional chest pain or discomfort. No unusual doe or fatigue. Symptoms occurred at rest, in bed. Pain is not pleuritic. No leg pain or swelling. No hx dvt or pe. No ocp use. No recent prolonged travel, trauma, or surgery. No cough or hemoptysis. No hx heart disease. No hx gerd. Denies abd pain. No hx pud, pancreatitis or gallstones. Denies cough or uri c/o. No fever or chills.    The history is provided by the patient.  Chest Pain   Associated symptoms include shortness of breath. Pertinent negatives include no abdominal pain, no back pain, no cough, no fever, no headaches and no palpitations.  Shortness of Breath  Associated symptoms include chest pain. Pertinent negatives include no fever, no headaches, no sore throat, no neck pain, no cough, no abdominal pain, no rash and no leg swelling.    Past Medical History:  Diagnosis Date  . Abnormal menstrual periods   . Allergy   . Frequent headaches    otc med prn  . GERD (gastroesophageal reflux disease)     Patient Active Problem List   Diagnosis Date Noted  . Tonsil stone 09/24/2016  . Hematuria 05/21/2016  . Back pain 05/21/2016  . Polyarthralgia 11/19/2015  . Chest pain 11/19/2015  . GERD (gastroesophageal reflux disease) 11/19/2015  . Arthralgia 11/14/2015  . Menorrhagia 02/20/2015    Past Surgical History:  Procedure Laterality Date  . LAPAROSCOPY N/A 02/14/2016   Procedure: LAPAROSCOPY DIAGNOSTIC FULGERATION OF PELVIC ENDOMETRIOSIS;  Surgeon: Maxie Better, MD;  Location: [facility];  Service: Gynecology;   Laterality: N/A;  @ 2hrs.  . WISDOM TOOTH EXTRACTION      OB History    No data available       Home Medications    Prior to Admission medications   Not on File    Family History Family History  Problem Relation Age of Onset  . Hyperlipidemia Maternal Aunt        x 2  . Heart disease Maternal Grandmother   . Hypertension Maternal Grandmother   . Hypertension Maternal Grandfather   . Colon cancer Neg Hx   . Esophageal cancer Neg Hx   . Stomach cancer Neg Hx   . Pancreatic cancer Neg Hx   . Liver disease Neg Hx   . Inflammatory bowel disease Neg Hx     Social History Social History  Substance Use Topics  . Smoking status: Never Smoker  . Smokeless tobacco: Never Used  . Alcohol use No     Allergies   Zithromax [azithromycin] and Penicillins   Review of Systems Review of Systems  Constitutional: Negative for chills and fever.  HENT: Negative for sore throat.   Eyes: Negative for redness.  Respiratory: Positive for shortness of breath. Negative for cough.   Cardiovascular: Positive for chest pain. Negative for palpitations and leg swelling.  Gastrointestinal: Negative for abdominal pain.  Genitourinary: Negative for flank pain.  Musculoskeletal: Negative for back pain and neck pain.  Skin: Negative for rash.  Neurological: Negative for  headaches.  Hematological: Does not bruise/bleed easily.  Psychiatric/Behavioral: Negative for confusion.     Physical Exam Updated Vital Signs BP 103/62   Pulse (!) 59   Temp 98.7 F (37.1 C)   Resp (!) 28   LMP 10/18/2016   SpO2 94%   Physical Exam  Constitutional: She appears well-developed and well-nourished. No distress.  HENT:  Mouth/Throat: Oropharynx is clear and moist.  Eyes: Conjunctivae are normal. No scleral icterus.  Neck: Neck supple. No tracheal deviation present. No thyromegaly present.  Cardiovascular: Normal rate, regular rhythm, normal heart sounds and intact distal pulses.  Exam reveals no  gallop and no friction rub.   No murmur heard. Pulmonary/Chest: Effort normal and breath sounds normal. No respiratory distress.  Abdominal: Soft. Normal appearance and bowel sounds are normal. She exhibits no distension and no mass. There is no tenderness. There is no rebound and no guarding. No hernia.  Genitourinary:  Genitourinary Comments: No cva tenderness  Musculoskeletal: She exhibits no edema.  Neurological: She is alert.  Skin: Skin is warm and dry. No rash noted.  Psychiatric:  Anxious appearing.   Nursing note and vitals reviewed.    ED Treatments / Results  Labs (all labs ordered are listed, but only abnormal results are displayed)  Results for orders placed or performed during the hospital encounter of 11/09/16  Basic metabolic panel  Result Value Ref Range   Sodium 139 135 - 145 mmol/L   Potassium 3.4 (L) 3.5 - 5.1 mmol/L   Chloride 108 101 - 111 mmol/L   CO2 19 (L) 22 - 32 mmol/L   Glucose, Bld 97 65 - 99 mg/dL   BUN 5 (L) 6 - 20 mg/dL   Creatinine, Ser 4.090.88 0.44 - 1.00 mg/dL   Calcium 81.110.4 (H) 8.9 - 10.3 mg/dL   GFR calc non Af Amer >60 >60 mL/min   GFR calc Af Amer >60 >60 mL/min   Anion gap 12 5 - 15  CBC  Result Value Ref Range   WBC 7.5 4.0 - 10.5 K/uL   RBC 5.01 3.87 - 5.11 MIL/uL   Hemoglobin 13.4 12.0 - 15.0 g/dL   HCT 91.441.0 78.236.0 - 95.646.0 %   MCV 81.8 78.0 - 100.0 fL   MCH 26.7 26.0 - 34.0 pg   MCHC 32.7 30.0 - 36.0 g/dL   RDW 21.313.6 08.611.5 - 57.815.5 %   Platelets 235 150 - 400 K/uL  D-dimer, quantitative (not at [facility])  Result Value Ref Range   D-Dimer, Quant 0.27 0.00 - 0.50 ug/mL-FEU  I-stat troponin, ED  Result Value Ref Range   Troponin i, poc 0.00 0.00 - 0.08 ng/mL   Comment 3           Dg Chest 2 View  Result Date: 11/09/2016 CLINICAL DATA:  Chest pain and shortness of breath, dizziness, weakness and numbness RIGHT arm EXAM: CHEST  2 VIEW COMPARISON:  None FINDINGS: Normal heart size, mediastinal contours, and pulmonary vascularity. Lungs  clear. No pleural effusion or pneumothorax. Bones unremarkable. IMPRESSION: Normal exam. Electronically Signed   By: Ulyses SouthwardMark  Boles M.D.   On: 11/09/2016 20:14    EKG  EKG Interpretation  Date/Time:  Sunday Nov 09 2016 19:14:40 EDT Ventricular Rate:  107 PR Interval:  144 QRS Duration: 74 QT Interval:  332 QTC Calculation: 443 R Axis:   100 Text Interpretation:  Sinus tachycardia Rightward axis Confirmed by Denton LankSTEINL  MD, Caryn BeeKEVIN ([id number]) on 11/09/2016 7:27:11 PM       Radiology  Dg Chest 2 View  Result Date: 11/09/2016 CLINICAL DATA:  Chest pain and shortness of breath, dizziness, weakness and numbness RIGHT arm EXAM: CHEST  2 VIEW COMPARISON:  None FINDINGS: Normal heart size, mediastinal contours, and pulmonary vascularity. Lungs clear. No pleural effusion or pneumothorax. Bones unremarkable. IMPRESSION: Normal exam. Electronically Signed   By: Ulyses Southward M.D.   On: 11/09/2016 20:14    Procedures Procedures (including critical care time)  Medications Ordered in ED Medications  famotidine (PEPCID) tablet 20 mg (not administered)  alum & mag hydroxide-simeth (MAALOX/MYLANTA) 200-200-20 MG/5ML suspension 15 mL (not administered)  acetaminophen (TYLENOL) tablet 1,000 mg (not administered)     Initial Impression / Assessment and Plan / ED Course  I have reviewed the triage vital signs and the nursing notes.  Pertinent labs & imaging results that were available during my care of the patient were reviewed by me and considered in my medical decision making (see chart for details).  Labs. Cxr.  Recheck, hr 72, rr 14, pulse ox 99% room air.  Pepcid, maalox, and acetaminophen given for symptom relief.  Recheck, pt asymptomatic, feels much improved. No sob. No pain.  Final Clinical Impressions(s) / ED Diagnoses   Final diagnoses:  None    New Prescriptions New Prescriptions   No medications on file     Cathren Laine, MD 11/09/16 2217

## 2016-11-09 NOTE — ED Triage Notes (Signed)
Pt c/o intermittent CP onset this am associated w/dizziness, numbness/tingly of left arm, left shoulder/neck pain, nauseas  A&O x4.... NAD... Ambulatory... Mom at bedside.

## 2016-11-09 NOTE — ED Notes (Signed)
Pt ambulated to restroom with assistance.  

## 2016-11-09 NOTE — Discharge Instructions (Signed)
It was our pleasure to provide your ER care today - we hope that you feel better.  Take protonix (acid blocker medication).  You may also try pepcid and maalox as need for symptom relief.  Follow up with primary care doctor in the coming week.  Return to ER if worse, new symptoms, high fevers, increased trouble breathing, persistent/recurrent chest pain, other concern.

## 2016-11-09 NOTE — ED Triage Notes (Signed)
Pt presents to the ed with complaints of chest pain and shortness of breath, patient has labored breathing in triage breath sounds clear. She was seen at urgent care and sent here to rule out PE.

## 2016-11-27 ENCOUNTER — Encounter: Payer: Self-pay | Admitting: Family Medicine

## 2016-11-27 DIAGNOSIS — M2550 Pain in unspecified joint: Secondary | ICD-10-CM | POA: Diagnosis not present

## 2016-11-27 DIAGNOSIS — R197 Diarrhea, unspecified: Secondary | ICD-10-CM | POA: Diagnosis not present

## 2016-11-27 DIAGNOSIS — R109 Unspecified abdominal pain: Secondary | ICD-10-CM | POA: Diagnosis not present

## 2016-11-27 DIAGNOSIS — G8929 Other chronic pain: Secondary | ICD-10-CM | POA: Diagnosis not present

## 2016-11-27 DIAGNOSIS — R194 Change in bowel habit: Secondary | ICD-10-CM | POA: Diagnosis not present

## 2016-12-05 ENCOUNTER — Telehealth: Payer: Self-pay | Admitting: Family Medicine

## 2016-12-05 NOTE — Telephone Encounter (Signed)
Hannah Ford, I do not think a PCP can order a colonoscopy.  Can I?

## 2016-12-05 NOTE — Telephone Encounter (Signed)
Dr Royston BakeAron Marion wanted me to clarify what Robin @ [facility] wanted  She wanted to know if you could order   1) colonoscopy 2) EGD upper endoscopy  Pt mom stated she thinks it would be cheaper to have this done here

## 2016-12-05 NOTE — Telephone Encounter (Signed)
Robin @ [facility] called pt saw Dr Nechama GuardBauer and She ordered colonoscopy and EGD  Upper endoscopy.  Pt stated she thinks she can get this done cheaper here.  Robin @ [facility]  Sent GI note to you  Can you order this for her.  Please fax reports to [phone]

## 2016-12-05 NOTE — Telephone Encounter (Signed)
I do not think I can order this.  It is performed by GI.  Can a primary care doctor order these procedures?

## 2016-12-08 NOTE — Telephone Encounter (Signed)
Dr Dayton Martesaron I spoke with stephanie @ [facility] this morning.  PT would have to establish with them before they will do any test.  They also stated they pt could not keep going to both GI dr.    I spoke with pt to let her know she would have to establish with provider before they will do the test and that she couldn't go between drs.  Pt [facility] will [facility] to schedule test  Left message asking Zella BallRobin @ [facility] to call me to so I can let her know what [facility] said

## 2016-12-08 NOTE — Telephone Encounter (Signed)
I will check with gi today

## 2016-12-09 NOTE — Telephone Encounter (Signed)
Robin @ [facility] is aware that pt would have to have consult before test could be done/   PT [facility]

## 2017-04-09 ENCOUNTER — Ambulatory Visit: Payer: Self-pay

## 2017-04-09 NOTE — Telephone Encounter (Signed)
   Reason for Disposition . [1] Continuous (nonstop) coughing interferes with work or school AND [2] no improvement using cough treatment per Care Advice . Earache  Answer Assessment - Initial Assessment Questions 1. ONSET: "When did the cough begin?"   04/06/17 2. SEVERITY: "How bad is the cough today?"      Worse today and feels sharp pain in her chest when she coughs 3. RESPIRATORY DISTRESS: "Describe your breathing."      nothing to be concerned about per pt 4. FEVER: "Do you have a fever?" If so, ask: "What is your temperature, how was it measured, and when did it start?"     No fever 5. SPUTUM: "Describe the color of your sputum" (clear, white, yellow, green)     Dark yellow like a "mustard" color 6. HEMOPTYSIS: "Are you coughing up any blood?" If so ask: "How much?" (flecks, streaks, tablespoons, etc.)     no 7. CARDIAC HISTORY: "Do you have any history of heart disease?" (e.g., heart attack, congestive heart failure)      no 8. LUNG HISTORY: "Do you have any history of lung disease?"  (e.g., pulmonary embolus, asthma, emphysema)     no 9. PE RISK FACTORS: "Do you have a history of blood clots?" (or: recent major surgery, recent prolonged travel, bedridden )     no 10. OTHER SYMPTOMS: "Do you have any other symptoms?" (e.g., runny nose, wheezing, chest pain)       Runny nose pain with cough 11. PREGNANCY: "Is there any chance you are pregnant?" "When was your last menstrual period?"       No LMP last week 03/30/17 12. TRAVEL: "Have you traveled out of the country in the last month?" (e.g., travel history, exposures)       no  Protocols used: COUGH - ACUTE PRODUCTIVE-A-AH

## 2017-04-10 ENCOUNTER — Ambulatory Visit (INDEPENDENT_AMBULATORY_CARE_PROVIDER_SITE_OTHER): Payer: 59 | Admitting: Family Medicine

## 2017-04-10 ENCOUNTER — Ambulatory Visit: Payer: Commercial Managed Care - HMO | Admitting: Internal Medicine

## 2017-04-13 ENCOUNTER — Ambulatory Visit: Payer: 59 | Admitting: Family Medicine

## 2017-04-13 ENCOUNTER — Telehealth: Payer: Self-pay | Admitting: Family Medicine

## 2017-04-13 NOTE — Telephone Encounter (Signed)
Copied from CRM #[id number]. Topic: Quick Communication - Appointment Cancellation >> Apr 13, 2017 10:00 AM [name], [name] wrote: Patient called to cancel appointment scheduled for 04/13/17. Patient has not rescheduled their appointment as she is feeling better. She states she paid a copy on Friday but left without being seen as the facility was busy that day. She is asking for a refund of her copay due to no service being rendered.    Route to department's PEC pool.   I called and spoke with patient regarding copay. I emailed charge correction and requested refund for copay since she was not seen. Pt understood a check would get mailed.

## 2017-07-17 ENCOUNTER — Encounter: Payer: Self-pay | Admitting: Family Medicine

## 2017-07-21 ENCOUNTER — Other Ambulatory Visit: Payer: Self-pay | Admitting: Family Medicine

## 2017-07-21 DIAGNOSIS — L299 Pruritus, unspecified: Secondary | ICD-10-CM

## 2017-07-22 DIAGNOSIS — L500 Allergic urticaria: Secondary | ICD-10-CM | POA: Diagnosis not present

## 2017-08-04 DIAGNOSIS — Z01419 Encounter for gynecological examination (general) (routine) without abnormal findings: Secondary | ICD-10-CM | POA: Diagnosis not present

## 2017-08-04 DIAGNOSIS — Z6833 Body mass index (BMI) 33.0-33.9, adult: Secondary | ICD-10-CM | POA: Diagnosis not present

## 2017-08-27 DIAGNOSIS — G43511 Persistent migraine aura without cerebral infarction, intractable, with status migrainosus: Secondary | ICD-10-CM | POA: Diagnosis not present

## 2017-09-03 ENCOUNTER — Encounter: Payer: Self-pay | Admitting: Allergy

## 2017-09-03 ENCOUNTER — Ambulatory Visit: Payer: 59 | Admitting: Allergy

## 2017-09-03 VITALS — BP 124/72 | HR 92 | Temp 97.9°F | Resp 18 | Ht 65.95 in | Wt 214.8 lb

## 2017-09-03 DIAGNOSIS — T783XXA Angioneurotic edema, initial encounter: Secondary | ICD-10-CM

## 2017-09-03 DIAGNOSIS — L500 Allergic urticaria: Secondary | ICD-10-CM | POA: Diagnosis not present

## 2017-09-03 NOTE — Patient Instructions (Addendum)
Hives and swelling    -  at this time etiology of hives and swelling is unknown.  Hives can be caused by a variety of different triggers including illness/infection, foods, medications, stings, exercise, pressure, vibrations, extremes of temperature to name a few however majority of the time there is no identifiable trigger.   -  Your symptoms at this time are acute lasting less than 6 weeks in duration.   If symptoms become persistent lasting >6 weeks would recommend obtaining labwork.   - environmental and select food skin prick testing today is very positive to dust mites.  Avoidance measures for dust mites discussed/provided.   Food allergy testing is equivocal to soybean.  Would recommend elimination of soybean from the diet and see if this prevents future episodes of hives.   - if hives and swelling return start following regimen: Allegra 180mg  and Zantac 150mg  twice a day - if hives return let us know if hives leave any marks/bruising, you have any fevers or any joint aches or pains.    Follow-up 6-9 months or sooner if needed

## 2017-09-03 NOTE — Progress Notes (Signed)
New Patient Note  RE: Hannah Ford MRN: [id number] DOB: January 24, 1996 Date of Office Visit: 09/03/2017  Referring provider: Dianne Dun, MD Primary care provider: Dianne Dun, MD  Chief Complaint: rash   History of present illness: Hannah Ford is a 22 y.o. female presenting today for consultation for hives.  She presents today with her mother.     She states she had an allergic reaction around the end of February where she started having itchiness all over with red splotches.  She states she had some minor swelling of her hands and arms.  She took a benadryl which didn't help initially but she states with additional doses it helped improve the rash.  Since the end of February she has had 3 episodes of this rash.  She states the rash typically is gone by the next day.  Rash does not leave any marks or bruising, no fevers, no joint aches/pains.  She denies any new foods (except for tofu), no new medications, stings or changes in body products/detergents.   She thought maybe she was having a food reaction but has not been able to identify any triggers.  She is newly vegetarian.  She does recall having avocado on 2/3 episodes. She has been avoiding avocado since.  She did see dermatologist who patient states gave her a cream to use (she does not recall this cream).      She has no history of eczema, asthma or seasonal or perennial allergy.  She does have rosacea.   Review of systems: Review of Systems  Constitutional: Negative for chills, fever and malaise/fatigue.  HENT: Negative for congestion, ear discharge, ear pain, nosebleeds, sinus pain, sore throat and tinnitus.   Eyes: Negative for pain, discharge and redness.  Respiratory: Negative for cough, shortness of breath and wheezing.   Cardiovascular: Negative for chest pain.  Gastrointestinal: Negative for abdominal pain, constipation, diarrhea, heartburn, nausea and vomiting.  Musculoskeletal: Negative for joint pain and myalgias.    Skin: Positive for itching and rash.  Neurological: Negative for headaches.    All other systems negative unless noted above in HPI  Past medical history: Past Medical History:  Diagnosis Date  . Abnormal menstrual periods   . Allergy   . Frequent headaches    otc med prn  . GERD (gastroesophageal reflux disease)   . Urticaria     Past surgical history: Past Surgical History:  Procedure Laterality Date  . LAPAROSCOPY N/A 02/14/2016   Procedure: LAPAROSCOPY DIAGNOSTIC FULGERATION OF PELVIC ENDOMETRIOSIS;  Surgeon: Maxie Better, MD;  Location: [facility];  Service: Gynecology;  Laterality: N/A;  @ 2hrs.  . WISDOM TOOTH EXTRACTION      Family history:  Family History  Problem Relation Age of Onset  . Eczema Mother   . Allergic rhinitis Mother   . Hyperlipidemia Maternal Aunt        x 2  . Food Allergy Maternal Aunt   . Heart disease Maternal Grandmother   . Hypertension Maternal Grandmother   . Hypertension Maternal Grandfather   . Colon cancer Neg Hx   . Esophageal cancer Neg Hx   . Stomach cancer Neg Hx   . Pancreatic cancer Neg Hx   . Liver disease Neg Hx   . Inflammatory bowel disease Neg Hx     Social history: She lives in a home with central heating.  No pets in the home.  No concern for water damage or mildew or roaches in the home.  She has no smoking history.   Medication List: Allergies as of 09/03/2017      Reactions   Latex Other (See Comments)   unknown Other reaction(s): Other (See Comments) unknown   Zithromax [azithromycin] Other (See Comments)   Face redness and swelling   Penicillins Rash   Has patient had a PCN reaction causing immediate rash, facial/tongue/throat swelling, SOB or lightheadedness with hypotension: Yes Has patient had a PCN reaction causing severe rash involving mucus membranes or skin necrosis: Yes Has patient had a PCN reaction that required hospitalization No Has patient had a PCN reaction occurring within the last 10  years: No If all of the above answers are "NO", then may proceed with Cephalosporin use.      Medication List        Accurate as of 09/03/17 12:09 PM. Always use your most recent med list.          BENADRYL ALLERGY PO Take by mouth as needed.   mometasone 0.1 % cream Commonly known as:  ELOCON APPLY TO AFFECTED AREA 2 TIMES A DAY   MULTI-VITAMINS Tabs Take by mouth.   TRI-LEGEST FE 1-20/1-30/1-35 MG-MCG tablet Generic drug:  norethindrone-ethinyl estradiol-iron Take 1 tablet by mouth daily.       Known medication allergies: Allergies  Allergen Reactions  . Latex Other (See Comments)    unknown Other reaction(s): Other (See Comments) unknown   . Zithromax [Azithromycin] Other (See Comments)    Face redness and swelling  . Penicillins Rash    Has patient had a PCN reaction causing immediate rash, facial/tongue/throat swelling, SOB or lightheadedness with hypotension: Yes Has patient had a PCN reaction causing severe rash involving mucus membranes or skin necrosis: Yes Has patient had a PCN reaction that required hospitalization No Has patient had a PCN reaction occurring within the last 10 years: No If all of the above answers are "NO", then may proceed with Cephalosporin use.      Physical examination: Blood pressure 124/72, pulse 92, temperature 97.9 F (36.6 C), temperature source Oral, resp. rate 18, height 5' 5.95" (1.675 m), weight 214 lb 12.8 oz (97.4 kg), SpO2 98 %.  General: Alert, interactive, in no acute distress. HEENT: PERRLA, TMs pearly gray, turbinates non-edematous without discharge, post-pharynx non erythematous. Neck: Supple without lymphadenopathy. Lungs: Clear to auscultation without wheezing, rhonchi or rales. {no increased work of breathing. CV: Normal S1, S2 without murmurs. Abdomen: Nondistended, nontender. Skin: Warm and dry, without lesions or rashes. Extremities:  No clubbing, cyanosis or edema. Neuro:   Grossly  intact.  Diagnositics/Labs:  Allergy testing: select food allergy skin prick testing is positive to dust mites. Environmental allergy skin prick testing to tree pollens, molds and indoor inhalents are equivocal to soybean. Allergy testing results were read and interpreted by provider, documented by clinical staff.   Assessment and plan: Urticaria with angioedema, allergic    -  at this time etiology of hives and swelling is unknown.  Hives can be caused by a variety of different triggers including illness/infection, foods, medications, stings, exercise, pressure, vibrations, extremes of temperature to name a few however majority of the time there is no identifiable trigger.   -  Your symptoms at this time are acute lasting less than 6 weeks in duration.   If symptoms become persistent lasting >6 weeks would recommend obtaining labwork.   - environmental and select food skin prick testing today is very positive to dust mites.  Avoidance measures for dust mites discussed/provided.  Food allergy testing is equivocal to soybean.  Would recommend elimination of soybean from the diet and see if this prevents future episodes of hives.   - if hives and swelling return start following regimen: Allegra 180mg  and Zantac 150mg  twice a day - if hives return let us know if hives leave any marks/bruising, you have any fevers or any joint aches or pains.    Follow-up 6-9 months or sooner if needed   I appreciate the opportunity to take part in Hannah Ford's care. Please do not hesitate to contact me with questions.  Sincerely,   Margo Aye, MD Allergy/Immunology [facility]

## 2017-10-19 ENCOUNTER — Ambulatory Visit: Payer: 59 | Admitting: Family Medicine

## 2017-10-19 ENCOUNTER — Encounter: Payer: Self-pay | Admitting: Family Medicine

## 2017-10-19 ENCOUNTER — Encounter: Payer: Self-pay | Admitting: Emergency Medicine

## 2017-10-19 VITALS — BP 112/60 | HR 98 | Temp 98.1°F | Ht 65.95 in | Wt 209.8 lb

## 2017-10-19 DIAGNOSIS — J069 Acute upper respiratory infection, unspecified: Secondary | ICD-10-CM

## 2017-10-19 DIAGNOSIS — B9789 Other viral agents as the cause of diseases classified elsewhere: Secondary | ICD-10-CM | POA: Diagnosis not present

## 2017-10-19 NOTE — Progress Notes (Signed)
Subjective:    Patient ID: Hannah Ford, female    DOB: [birth year], 22 y.o.   MRN: [id number]  HPI This is a 22 yo female, accompanied by her mother, who presents today with 5 days of dry cough, has progressed to productive cough with thick green-yellow phlegm. Clear, yellow blood tinged nasal drainage. Sore throat, dizzy, pressure in head and chest soreness. Yesterday felt SOB, no wheeze.  Has taken Dayquil and Musinex fast max severe cold with some relief. Feels a little better today, yesterday was worst day. Subjective fever. Sick contacts at work.  No history of smoking or asthma.    Past Medical History:  Diagnosis Date  . Abnormal menstrual periods   . Allergy   . Frequent headaches    otc med prn  . GERD (gastroesophageal reflux disease)   . Urticaria    Past Surgical History:  Procedure Laterality Date  . LAPAROSCOPY N/A 02/14/2016   Procedure: LAPAROSCOPY DIAGNOSTIC FULGERATION OF PELVIC ENDOMETRIOSIS;  Surgeon: Maxie Better, MD;  Location: [facility];  Service: Gynecology;  Laterality: N/A;  @ 2hrs.  . WISDOM TOOTH EXTRACTION     Family History  Problem Relation Age of Onset  . Eczema Mother   . Allergic rhinitis Mother   . Hyperlipidemia Maternal Aunt        x 2  . Food Allergy Maternal Aunt   . Heart disease Maternal Grandmother   . Hypertension Maternal Grandmother   . Hypertension Maternal Grandfather   . Colon cancer Neg Hx   . Esophageal cancer Neg Hx   . Stomach cancer Neg Hx   . Pancreatic cancer Neg Hx   . Liver disease Neg Hx   . Inflammatory bowel disease Neg Hx       Review of Systems Per HPI    Objective:   Physical Exam  Constitutional: She is oriented to person, place, and time. She appears well-developed and well-nourished. She appears ill. No distress.  HENT:  Head: Normocephalic and atraumatic.  Right Ear: Tympanic membrane, external ear and ear canal normal.  Left Ear: Tympanic membrane, external ear and ear canal normal.  Nose:  Mucosal edema and rhinorrhea present.  Mouth/Throat: Uvula is midline and mucous membranes are normal. Posterior oropharyngeal erythema (mild, post nasal drainage) present. No oropharyngeal exudate or posterior oropharyngeal edema.  Eyes: Conjunctivae are normal.  Neck: Normal range of motion. Neck supple.  Cardiovascular: Normal rate, regular rhythm and normal heart sounds.  Pulmonary/Chest: Effort normal and breath sounds normal.  Occasional deep, non productive cough.   Lymphadenopathy:    She has no cervical adenopathy.  Neurological: She is alert and oriented to person, place, and time.  Skin: Skin is warm and dry. She is not diaphoretic.  Psychiatric: She has a normal mood and affect. Her behavior is normal. Judgment and thought content normal.  Vitals reviewed.     BP 112/60 (BP Location: Left Arm, Patient Position: Sitting, Cuff Size: Large)   Pulse 98   Temp 98.1 F (36.7 C) (Oral)   Ht 5' 5.95" (1.675 m)   Wt 209 lb 12 oz (95.1 kg)   LMP 10/14/2017   SpO2 99%   BMI 33.91 kg/m  Wt Readings from Last 3 Encounters:  10/19/17 209 lb 12 oz (95.1 kg)  09/03/17 214 lb 12.8 oz (97.4 kg)  09/24/16 227 lb (103 kg)       Assessment & Plan:  1. Viral URI with cough - Provided written and verbal information regarding diagnosis  and treatment. -  Patient Instructions  For nasal congestion you can use Afrin nasal spray for 3 days max, Sudafed, saline nasal spray (generic is fine for all). For cough you can try Delsym. Drink enough fluids to make your urine light yellow. For fever/chill/muscle aches you can take over the counter acetaminophen or ibuprofen (2 tablets every 8-`12 hours).  Please come back in if you are not better in 5-7 days or if you develop wheezing, shortness of breath or persistent vomiting.    Olean Ree, FNP-BC  [facility], [facility]  10/19/2017 10:35 AM

## 2017-10-19 NOTE — Patient Instructions (Signed)
For nasal congestion you can use Afrin nasal spray for 3 days max, Sudafed, saline nasal spray (generic is fine for all). For cough you can try Delsym. Drink enough fluids to make your urine light yellow. For fever/chill/muscle aches you can take over the counter acetaminophen or ibuprofen (2 tablets every 8-`12 hours).  Please come back in if you are not better in 5-7 days or if you develop wheezing, shortness of breath or persistent vomiting.

## 2017-10-20 ENCOUNTER — Encounter: Payer: Self-pay | Admitting: Family Medicine

## 2017-10-20 ENCOUNTER — Ambulatory Visit: Payer: 59 | Admitting: Family Medicine

## 2017-10-20 DIAGNOSIS — J069 Acute upper respiratory infection, unspecified: Secondary | ICD-10-CM

## 2017-10-20 MED ORDER — HYDROCOD POLST-CPM POLST ER 10-8 MG/5ML PO SUER: 5.0000 mL | Freq: Two times a day (BID) | ORAL | 0 refills | Status: DC | PRN

## 2017-10-20 MED ORDER — DOXYCYCLINE HYCLATE 100 MG PO TABS: 100.0000 mg | ORAL_TABLET | Freq: Two times a day (BID) | ORAL | 0 refills | Status: DC

## 2017-10-20 NOTE — Patient Instructions (Signed)
Great to see you.  Please use tussionex as needed for severe cough, okay to continue your other medications and drink water.  If symptoms not improving, take your prescription for doxycyline to your pharmacy.

## 2017-10-20 NOTE — Progress Notes (Signed)
Subjective:   Patient ID: Hannah Ford, female    DOB: [birth year], 22 y.o.   MRN: [id number]  Hannah Ford is a pleasant 22 y.o. year old female who presents to clinic today with URI (Patient is here today C/O a possible URI.  Sx started Thursday.  Went to [facility] yesterday and was advised to get Psudofed, Delsym, Afrin, and Ibuprofen.  She was up all night coughing and coughed so much she started throwing up.  Sputum is yellow-green.  She also has frontal, nasal pressure, and AU pain.  Her father gave her a Benzonatate which helped some during the day but not at night.)  on 10/20/2017  HPI:   Follow up URI- went to [facility] and was seen for URI symptoms. Note reviewed. [place]-  HPI as follows:   This is a 22 yo female, accompanied by her mother, who presents today with 5 days of dry cough, has progressed to productive cough with thick green-yellow phlegm. Clear, yellow blood tinged nasal drainage. Sore throat, dizzy, pressure in head and chest soreness. Yesterday felt SOB, no wheeze.  Has taken Dayquil and Musinex fast max severe cold with some relief. Feels a little better today, yesterday was worst day. Subjective fever. Sick contacts at work.  No history of smoking or asthma.   Exam was reassuring.  Advised likely viral- advised short course of Afrin, sudafed and nasal spray. Also Delsym as needed for cough.  She is here today because she feels worse.  Overnight, coughed so hard she vomited. Sputum is now yellow/green and now having sinus pressure and ear pain.  Current Outpatient Medications on File Prior to Visit  Medication Sig Dispense Refill  . DiphenhydrAMINE HCl (BENADRYL ALLERGY PO) Take by mouth as needed.    . mometasone (ELOCON) 0.1 % cream APPLY TO AFFECTED AREA 2 TIMES A DAY  0  . Multiple Vitamin (MULTI-VITAMINS) TABS Take by mouth.    . TRI-LEGEST FE 1-20/1-30/1-35 MG-MCG tablet Take 1 tablet by mouth daily.  3   No current  facility-administered medications on file prior to visit.     Allergies  Allergen Reactions  . Latex Other (See Comments)    unknown Other reaction(s): Other (See Comments) unknown   . Zithromax [Azithromycin] Other (See Comments)    Face redness and swelling  . Penicillins Rash    Has patient had a PCN reaction causing immediate rash, facial/tongue/throat swelling, SOB or lightheadedness with hypotension: Yes Has patient had a PCN reaction causing severe rash involving mucus membranes or skin necrosis: Yes Has patient had a PCN reaction that required hospitalization No Has patient had a PCN reaction occurring within the last 10 years: No If all of the above answers are "NO", then may proceed with Cephalosporin use.     Past Medical History:  Diagnosis Date  . Abnormal menstrual periods   . Allergy   . Frequent headaches    otc med prn  . GERD (gastroesophageal reflux disease)   . Urticaria     Past Surgical History:  Procedure Laterality Date  . LAPAROSCOPY N/A 02/14/2016   Procedure: LAPAROSCOPY DIAGNOSTIC FULGERATION OF PELVIC ENDOMETRIOSIS;  Surgeon: Maxie Better, MD;  Location: [facility];  Service: Gynecology;  Laterality: N/A;  @ 2hrs.  . WISDOM TOOTH EXTRACTION      Family History  Problem Relation Age of Onset  . Eczema Mother   . Allergic rhinitis Mother   . Hyperlipidemia Maternal Aunt  x 2  . Food Allergy Maternal Aunt   . Heart disease Maternal Grandmother   . Hypertension Maternal Grandmother   . Hypertension Maternal Grandfather   . Colon cancer Neg Hx   . Esophageal cancer Neg Hx   . Stomach cancer Neg Hx   . Pancreatic cancer Neg Hx   . Liver disease Neg Hx   . Inflammatory bowel disease Neg Hx     Social History   Socioeconomic History  . Marital status: Single    Spouse name: Not on file  . Number of children: 0  . Years of education: Not on file  . Highest education level: Not on file  Occupational History  . Occupation:  unemployed  Social Needs  . Financial resource strain: Not on file  . Food insecurity:    Worry: Not on file    Inability: Not on file  . Transportation needs:    Medical: Not on file    Non-medical: Not on file  Tobacco Use  . Smoking status: Never Smoker  . Smokeless tobacco: Never Used  Substance and Sexual Activity  . Alcohol use: No  . Drug use: No  . Sexual activity: Never    Birth control/protection: None  Lifestyle  . Physical activity:    Days per week: Not on file    Minutes per session: Not on file  . Stress: Not on file  Relationships  . Social connections:    Talks on phone: Not on file    Gets together: Not on file    Attends religious service: Not on file    Active member of club or organization: Not on file    Attends meetings of clubs or organizations: Not on file    Relationship status: Not on file  . Intimate partner violence:    Fear of current or ex partner: Not on file    Emotionally abused: Not on file    Physically abused: Not on file    Forced sexual activity: Not on file  Other Topics Concern  . Not on file  Social History Narrative  . Not on file   The PMH, PSH, Social History, Family History, Medications, and allergies have been reviewed in [facility], and have been updated if relevant.  Review of Systems  Constitutional: Positive for chills. Negative for fever.  HENT: Positive for congestion, ear pain, postnasal drip, sinus pressure and sinus pain. Negative for drooling, ear discharge and facial swelling.   Respiratory: Positive for cough and wheezing. Negative for shortness of breath.   Cardiovascular: Negative.   Gastrointestinal: Positive for vomiting.  Skin: Negative.   Neurological: Negative.   Hematological: Negative.   Psychiatric/Behavioral: Negative.   All other systems reviewed and are negative.      Objective:    BP 108/68 (BP Location: Left Arm, Patient Position: Sitting, Cuff Size: Normal)   Pulse 82   Temp 98.7 F (37.1  C) (Oral)   Ht 5' 5.95" (1.675 m)   Wt 210 lb 12.8 oz (95.6 kg)   LMP 10/14/2017   SpO2 100%   BMI 34.08 kg/m    Physical Exam  Constitutional: She is oriented to person, place, and time. She appears well-developed and well-nourished. No distress.  HENT:  Head: Normocephalic and atraumatic.  Right Ear: Hearing normal. No tenderness. No middle ear effusion.  Left Ear: Hearing normal. No tenderness. A middle ear effusion is present.  Nose: Mucosal edema present. Right sinus exhibits no maxillary sinus tenderness and no frontal  sinus tenderness. Left sinus exhibits no maxillary sinus tenderness and no frontal sinus tenderness.  Pulmonary/Chest: Breath sounds normal. No respiratory distress.  Musculoskeletal: Normal range of motion.  Neurological: She is alert and oriented to person, place, and time. No cranial nerve deficit. Coordination normal.  Skin: She is not diaphoretic.  Psychiatric: She has a normal mood and affect. Her behavior is normal. Judgment and thought content normal.  Nursing note and vitals reviewed.         Assessment & Plan:   No diagnosis found. No follow-ups on file.

## 2017-10-20 NOTE — Assessment & Plan Note (Signed)
Discussed with [place] and her mom- exam remains reassuring.  Still likely viral at this point.  Given rx for tussionex to use for severe cough and hopefully that will prevent post tussive emesis and allow her to rest tonight.  Discussed sedation precautions.  Rx printed for doxycyline but advised not to fill it unless symptoms continue to worsen over next few days. The patient indicates understanding of these issues and agrees with the plan.

## 2017-11-17 IMAGING — CT CT RENAL STONE PROTOCOL
2 of 4 series · 16 of 46 positions shown, 18 images · non-contrast
Comparison: CT scan dated 01/19/2015

CLINICAL DATA: Chronic progressive bilateral flank pain. Hematuria
1 week ago.

EXAM:
CT ABDOMEN AND PELVIS WITHOUT CONTRAST
TECHNIQUE: Multidetector CT imaging of the abdomen and pelvis was performed
following the standard protocol without IV contrast.

[Series 2: stone study 5.0 i30f 1 · axial · 0.78mm/px · z∈[-505,-85]mm · 13 of 92 slices shown, 15 images]
[im 4/92  soft-tissue]
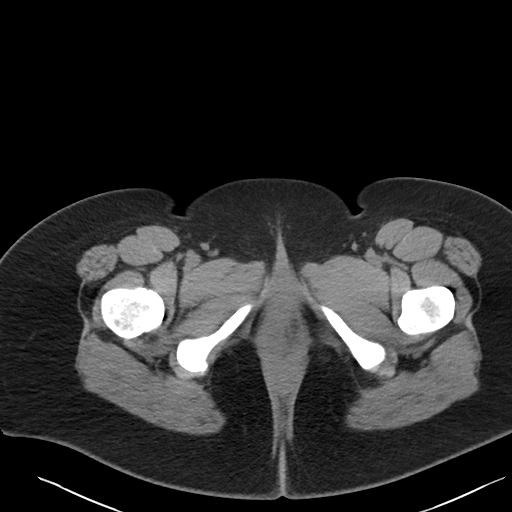
[im 4/92  bone]
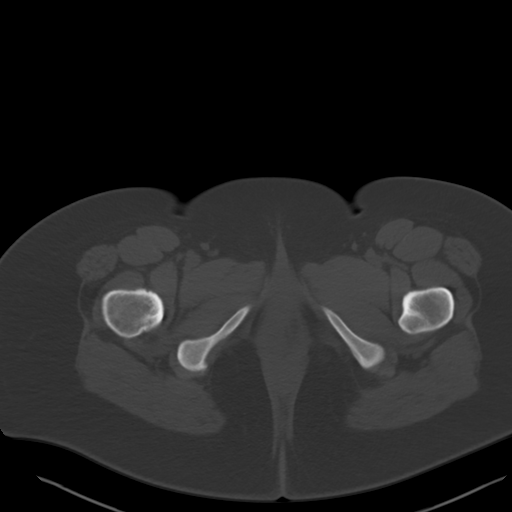
[im 12/92  soft-tissue]
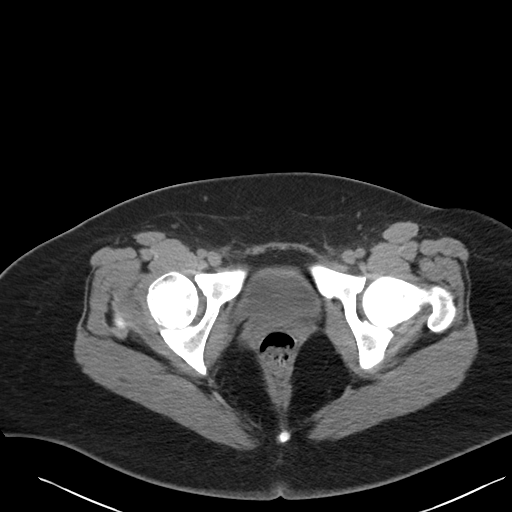
[im 19/92  soft-tissue]
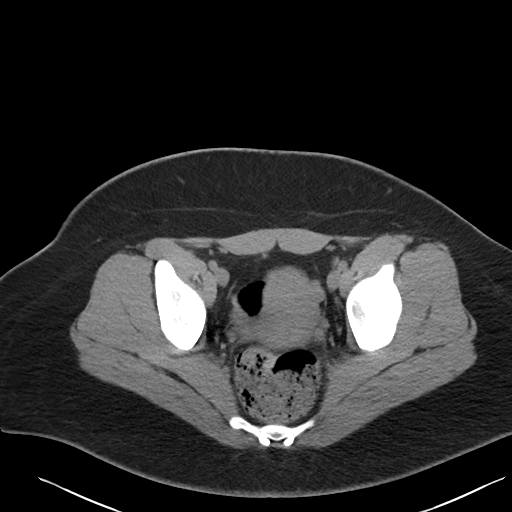
[im 27/92  soft-tissue]
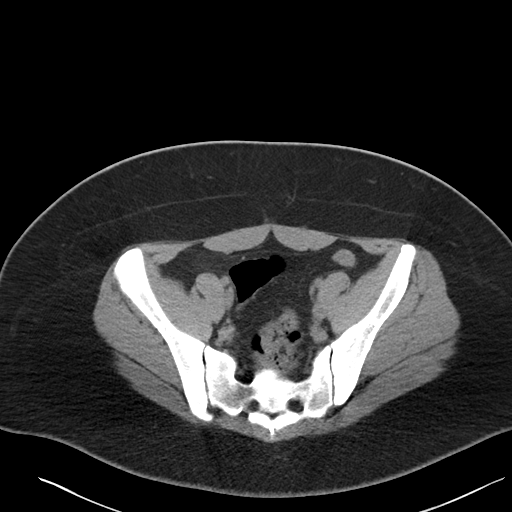
[im 31/92  soft-tissue]
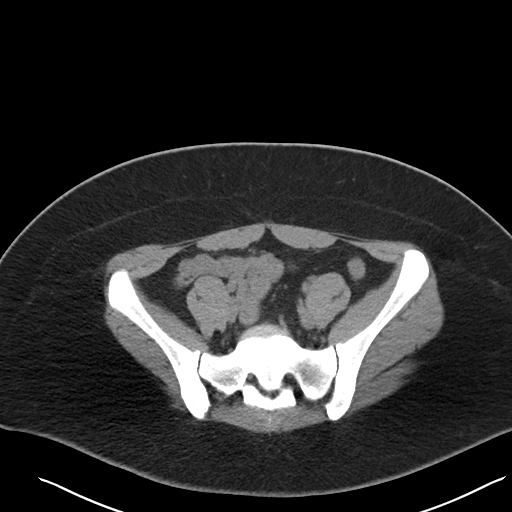
[im 38/92  soft-tissue]
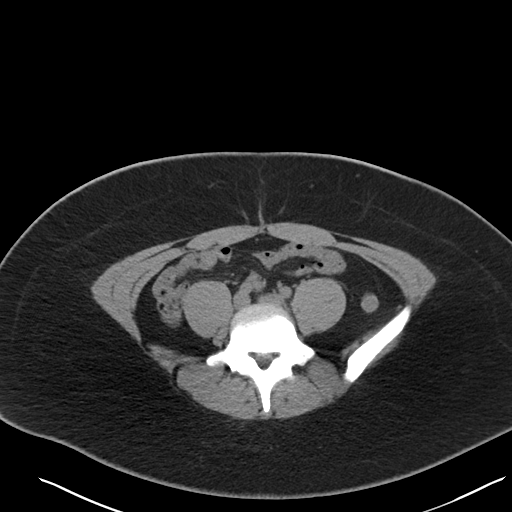
[im 46/92  soft-tissue]
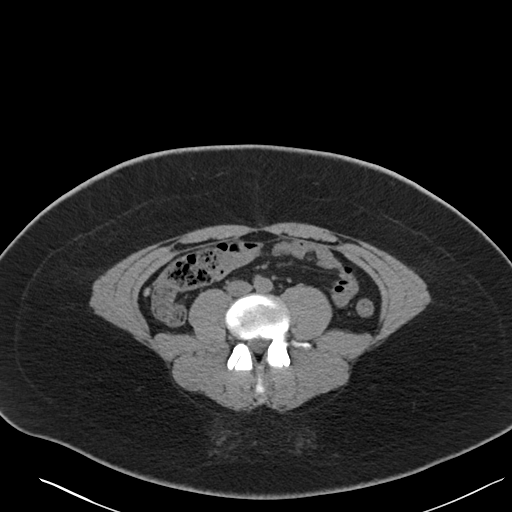
[im 54/92  soft-tissue]
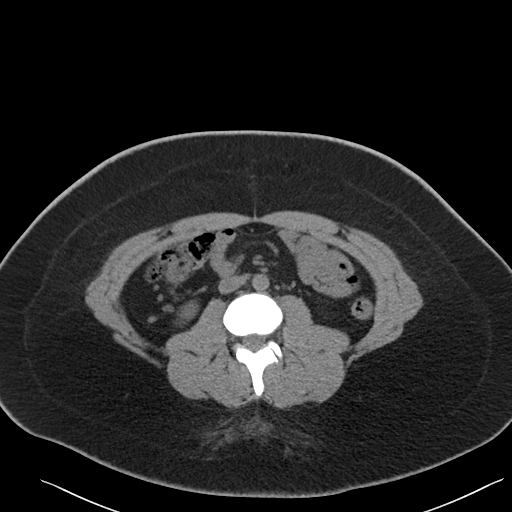
[im 61/92  soft-tissue]
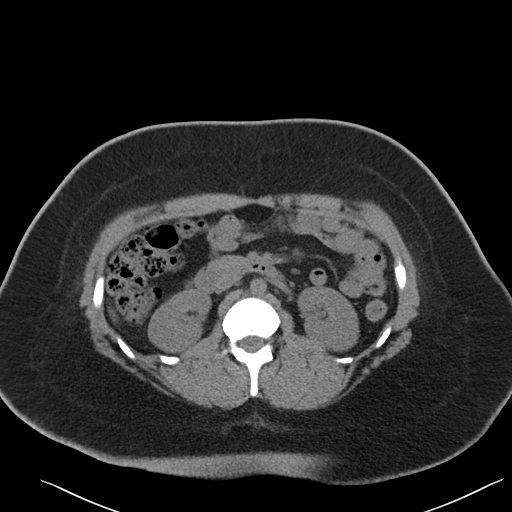
[im 61/92  bone]
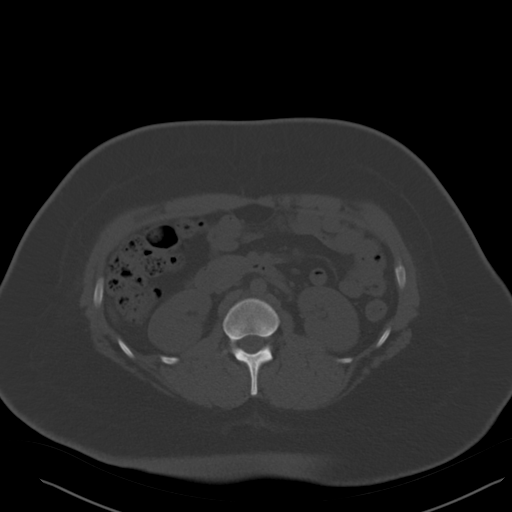
[im 65/92  soft-tissue]
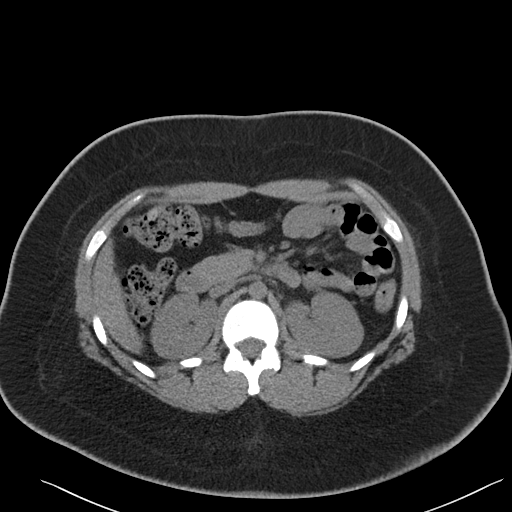
[im 73/92  soft-tissue]
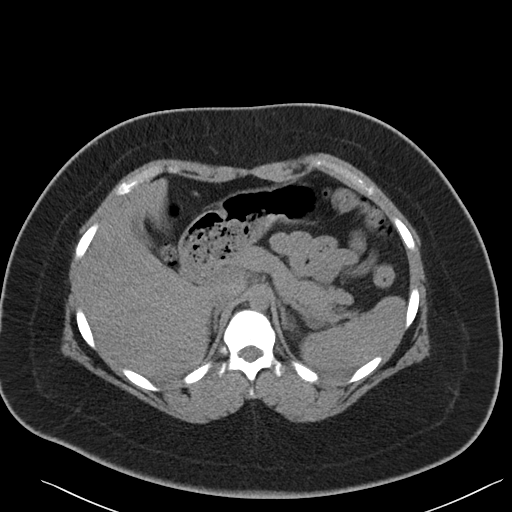
[im 80/92  soft-tissue]
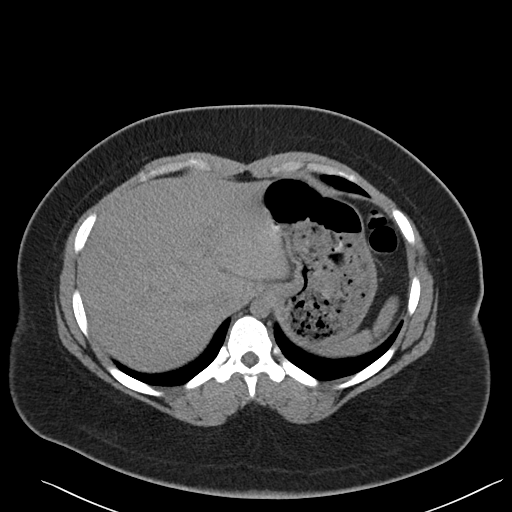
[im 88/92  soft-tissue]
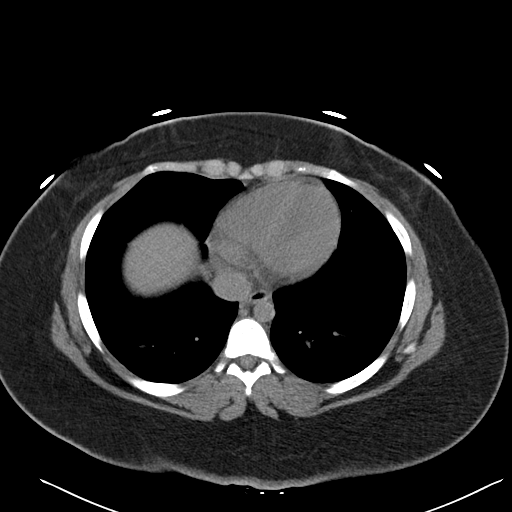

[Series 5: coronal soft tissue · coronal · 0.72mm/px · 3 of 85 slices shown]
[im 29/85  soft-tissue]
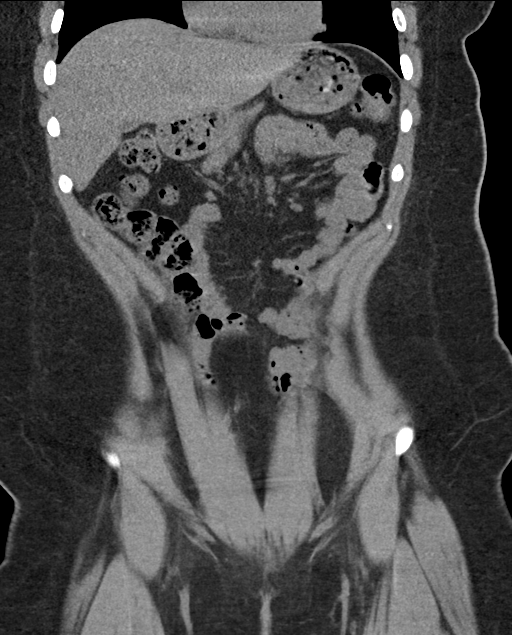
[im 38/85  soft-tissue]
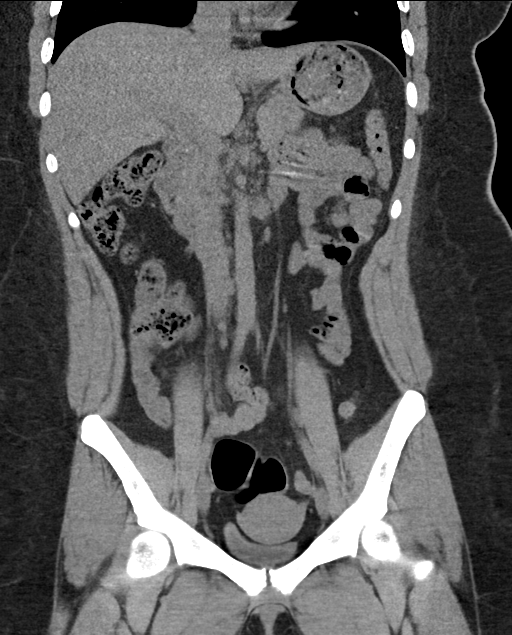
[im 47/85  soft-tissue]
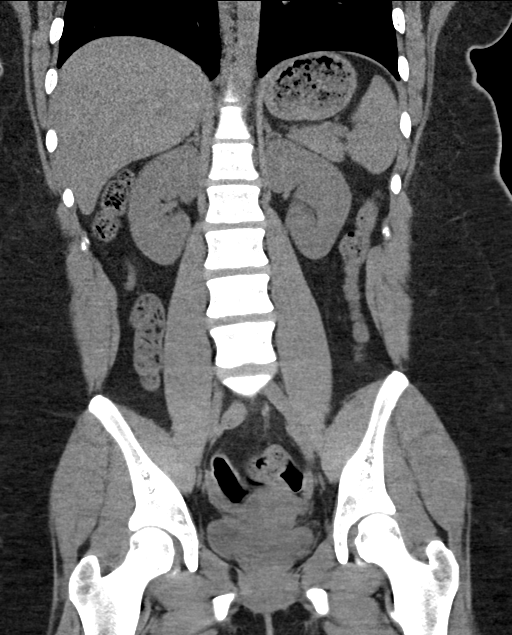

[16 of 46 positions shown; findings below may reference images not displayed]

FINDINGS: Lower chest: Normal.

Hepatobiliary: No focal liver abnormality is seen. No gallstones,
gallbladder wall thickening, or biliary dilatation.

Pancreas: Unremarkable. No pancreatic ductal dilatation or
surrounding inflammatory changes.

Spleen: Normal in size without focal abnormality.

Adrenals/Urinary Tract: Adrenal glands are unremarkable. Kidneys are
normal, without renal calculi, focal lesion, or hydronephrosis.
Bladder is unremarkable.

Stomach/Bowel: There is a large amount of stool in the rectum. The
bowel is otherwise normal including the terminal ileum and appendix.

Vascular/Lymphatic: No significant vascular findings are present. No
enlarged abdominal or pelvic lymph nodes.

Reproductive: Uterus and bilateral adnexa are unremarkable.

Other: No abdominal wall hernia or abnormality. No abdominopelvic
ascites.

Musculoskeletal: No acute or significant osseous findings.
IMPRESSION: Large amount of stool in the rectum.  Otherwise, normal exam.

## 2017-12-09 DIAGNOSIS — L2089 Other atopic dermatitis: Secondary | ICD-10-CM | POA: Diagnosis not present

## 2017-12-09 DIAGNOSIS — L218 Other seborrheic dermatitis: Secondary | ICD-10-CM | POA: Diagnosis not present

## 2018-01-18 ENCOUNTER — Ambulatory Visit (INDEPENDENT_AMBULATORY_CARE_PROVIDER_SITE_OTHER): Payer: BLUE CROSS/BLUE SHIELD | Admitting: Family Medicine

## 2018-01-18 ENCOUNTER — Ambulatory Visit (INDEPENDENT_AMBULATORY_CARE_PROVIDER_SITE_OTHER): Payer: BLUE CROSS/BLUE SHIELD

## 2018-01-18 ENCOUNTER — Encounter: Payer: Self-pay | Admitting: Family Medicine

## 2018-01-18 VITALS — BP 124/72 | HR 62 | Ht 65.95 in | Wt 214.0 lb

## 2018-01-18 DIAGNOSIS — M25572 Pain in left ankle and joints of left foot: Secondary | ICD-10-CM

## 2018-01-18 NOTE — Progress Notes (Signed)
Hannah Ford - 22 y.o. female MRN [id number]  Date of birth: [birth year]  SUBJECTIVE:  Including CC & ROS.  Chief Complaint  Patient presents with  . left ankle pain    Hannah Ford is a 22 y.o. female that is presenting with left ankle pain. She hit her ankle on the door of her car. Pain is located on the lateral malleolus and radiates up to her leg. Admits to swelling and tenderness. Pain is mild to severe during dorsiflexion and extension. Pain is constant, worse when she puts weight on it or ambulates. She did apply ice and has been taking some advil. Denies surgeries.     Review of Systems  Constitutional: Negative for fever.  HENT: Negative for congestion.   Cardiovascular: Negative for chest pain.  Gastrointestinal: Negative for abdominal pain.  Musculoskeletal: Positive for gait problem.  Skin: Negative for color change.  Neurological: Negative for weakness.  Hematological: Negative for adenopathy.  Psychiatric/Behavioral: Negative for agitation.    HISTORY: Past Medical, Surgical, Social, and Family History Reviewed & Updated per EMR.   Pertinent Historical Findings include:  Past Medical History:  Diagnosis Date  . Abnormal menstrual periods   . Allergy   . Frequent headaches    otc med prn  . GERD (gastroesophageal reflux disease)   . Urticaria     Past Surgical History:  Procedure Laterality Date  . LAPAROSCOPY N/A 02/14/2016   Procedure: LAPAROSCOPY DIAGNOSTIC FULGERATION OF PELVIC ENDOMETRIOSIS;  Surgeon: Maxie Better, MD;  Location: [facility];  Service: Gynecology;  Laterality: N/A;  @ 2hrs.  . WISDOM TOOTH EXTRACTION      Allergies  Allergen Reactions  . Latex Other (See Comments)    unknown Other reaction(s): Other (See Comments) unknown   . Zithromax [Azithromycin] Other (See Comments)    Face redness and swelling  . Penicillins Rash    Has patient had a PCN reaction causing immediate rash, facial/tongue/throat swelling, SOB or lightheadedness  with hypotension: Yes Has patient had a PCN reaction causing severe rash involving mucus membranes or skin necrosis: Yes Has patient had a PCN reaction that required hospitalization No Has patient had a PCN reaction occurring within the last 10 years: No If all of the above answers are "NO", then may proceed with Cephalosporin use.     Family History  Problem Relation Age of Onset  . Eczema Mother   . Allergic rhinitis Mother   . Hyperlipidemia Maternal Aunt        x 2  . Food Allergy Maternal Aunt   . Heart disease Maternal Grandmother   . Hypertension Maternal Grandmother   . Hypertension Maternal Grandfather   . Colon cancer Neg Hx   . Esophageal cancer Neg Hx   . Stomach cancer Neg Hx   . Pancreatic cancer Neg Hx   . Liver disease Neg Hx   . Inflammatory bowel disease Neg Hx      Social History   Socioeconomic History  . Marital status: Single    Spouse name: Not on file  . Number of children: 0  . Years of education: Not on file  . Highest education level: Not on file  Occupational History  . Occupation: unemployed  Social Needs  . Financial resource strain: Not on file  . Food insecurity:    Worry: Not on file    Inability: Not on file  . Transportation needs:    Medical: Not on file    Non-medical: Not on file  Tobacco Use  . Smoking status: Never Smoker  . Smokeless tobacco: Never Used  Substance and Sexual Activity  . Alcohol use: No  . Drug use: No  . Sexual activity: Never    Birth control/protection: None  Lifestyle  . Physical activity:    Days per week: Not on file    Minutes per session: Not on file  . Stress: Not on file  Relationships  . Social connections:    Talks on phone: Not on file    Gets together: Not on file    Attends religious service: Not on file    Active member of club or organization: Not on file    Attends meetings of clubs or organizations: Not on file    Relationship status: Not on file  . Intimate partner violence:      Fear of current or ex partner: Not on file    Emotionally abused: Not on file    Physically abused: Not on file    Forced sexual activity: Not on file  Other Topics Concern  . Not on file  Social History Narrative  . Not on file     PHYSICAL EXAM:  VS: BP 124/72 (BP Location: Left Arm, Patient Position: Sitting, Cuff Size: Normal)   Pulse 62   Ht 5' 5.95" (1.675 m)   Wt 214 lb (97.1 kg)   SpO2 98%   BMI 34.59 kg/m  Physical Exam Gen: NAD, alert, cooperative with exam, well-appearing ENT: normal lips, normal nasal mucosa,  Eye: normal EOM, normal conjunctiva and lids CV:  no edema, +2 pedal pulses   Resp: no accessory muscle use, non-labored,  Skin: no rashes, no areas of induration  Neuro: normal tone, normal sensation to touch Psych:  normal insight, alert and oriented MSK:  Left ankle:  TTp over the lateral malleolus  No TTP over the 5th MT, navicular or cuboid  Normal passive ROM  Active ROm with pain  Normal strength to resistance in all directions Small dots of bruising on the distal fibula.  Neurovascularly intact.   Limited ultrasound: left ankle:  Normal appearing distal fibula with no fracture present  Normal appearing peroneal tendons at the lateral malleolus. Normal insertion of peroneal brevis at the 5th MT  Normal appearing ankle space.  Normal appearing midfoot  Summary: normal exam   Ultrasound and interpretation by Clare GandyJeremy Schmitz, MD       ASSESSMENT & PLAN:   Acute left ankle pain Appears to have a bony contusion of the distal fib. No fracture appreciated and US was reassuring.  - counseled on HEP  - counseled on ice and compression  - duexis samples provided  - if no improvement consider imaging and PT.

## 2018-01-18 NOTE — Assessment & Plan Note (Signed)
Appears to have a bony contusion of the distal fib. No fracture appreciated and US was reassuring.  - counseled on HEP  - counseled on ice and compression  - duexis samples provided  - if no improvement consider imaging and PT.

## 2018-01-18 NOTE — Patient Instructions (Addendum)
Nice to meet you  Please try ice and compression  Please try the exercises  Please follow up with me in 2-3 weeks if the pain is still ongoing.

## 2018-02-19 ENCOUNTER — Ambulatory Visit (INDEPENDENT_AMBULATORY_CARE_PROVIDER_SITE_OTHER): Payer: BLUE CROSS/BLUE SHIELD | Admitting: Family Medicine

## 2018-02-19 ENCOUNTER — Encounter: Payer: Self-pay | Admitting: Family Medicine

## 2018-02-19 VITALS — BP 118/80 | HR 77 | Temp 98.0°F | Ht 65.95 in

## 2018-02-19 DIAGNOSIS — H6983 Other specified disorders of Eustachian tube, bilateral: Secondary | ICD-10-CM | POA: Diagnosis not present

## 2018-02-19 DIAGNOSIS — J4521 Mild intermittent asthma with (acute) exacerbation: Secondary | ICD-10-CM | POA: Diagnosis not present

## 2018-02-19 DIAGNOSIS — J40 Bronchitis, not specified as acute or chronic: Secondary | ICD-10-CM

## 2018-02-19 MED ORDER — BENZONATATE 100 MG PO CAPS: 100.0000 mg | ORAL_CAPSULE | Freq: Two times a day (BID) | ORAL | 0 refills | Status: DC | PRN

## 2018-02-19 MED ORDER — PREDNISONE 10 MG PO TABS: 10.0000 mg | ORAL_TABLET | Freq: Two times a day (BID) | ORAL | 0 refills | Status: AC

## 2018-02-19 MED ORDER — AMOXICILLIN 500 MG PO CAPS: 500.0000 mg | ORAL_CAPSULE | Freq: Three times a day (TID) | ORAL | 0 refills | Status: DC

## 2018-02-19 NOTE — Patient Instructions (Signed)
Acute Bronchitis, Adult Acute bronchitis is when air tubes (bronchi) in the lungs suddenly get swollen. The condition can make it hard to breathe. It can also cause these symptoms:  A cough.  Coughing up clear, yellow, or green mucus.  Wheezing.  Chest congestion.  Shortness of breath.  A fever.  Body aches.  Chills.  A sore throat.  Follow these instructions at home: Medicines  Take over-the-counter and prescription medicines only as told by your doctor.  If you were prescribed an antibiotic medicine, take it as told by your doctor. Do not stop taking the antibiotic even if you start to feel better. General instructions  Rest.  Drink enough fluids to keep your pee (urine) clear or pale yellow.  Avoid smoking and secondhand smoke. If you smoke and you need help quitting, ask your doctor. Quitting will help your lungs heal faster.  Use an inhaler, cool mist vaporizer, or humidifier as told by your doctor.  Keep all follow-up visits as told by your doctor. This is important. How is this prevented? To lower your risk of getting this condition again:  Wash your hands often with soap and water. If you cannot use soap and water, use hand sanitizer.  Avoid contact with people who have cold symptoms.  Try not to touch your hands to your mouth, nose, or eyes.  Make sure to get the flu shot every year.  Contact a doctor if:  Your symptoms do not get better in 2 weeks. Get help right away if:  You cough up blood.  You have chest pain.  You have very bad shortness of breath.  You become dehydrated.  You faint (pass out) or keep feeling like you are going to pass out.  You keep throwing up (vomiting).  You have a very bad headache.  Your fever or chills gets worse. This information is not intended to replace advice given to you by your health care provider. Make sure you discuss any questions you have with your health care provider. Document Released:  11/19/2007 Document Revised: 01/09/2016 Document Reviewed: 11/21/2015 Elsevier Interactive Patient Education  2018 [employer]  Eustachian Tube Dysfunction The eustachian tube connects the middle ear to the back of the nose. It regulates air pressure in the middle ear by allowing air to move between the ear and nose. It also helps to drain fluid from the middle ear space. When the eustachian tube does not function properly, air pressure, fluid, or both can build up in the middle ear. Eustachian tube dysfunction can affect one or both ears. What are the causes? This condition happens when the eustachian tube becomes blocked or cannot open normally. This may result from:  Ear infections.  Colds and other upper respiratory infections.  Allergies.  Irritation, such as from cigarette smoke or acid from the stomach coming up into the esophagus (gastroesophageal reflux).  Sudden changes in air pressure, such as from descending in an airplane.  Abnormal growths in the nose or throat, such as nasal polyps, tumors, or enlarged tissue at the back of the throat (adenoids).  What increases the risk? This condition may be more likely to develop in people who smoke and people who are overweight. Eustachian tube dysfunction may also be more likely to develop in children, especially children who have:  Certain birth defects of the mouth, such as cleft palate.  Large tonsils and adenoids.  What are the signs or symptoms? Symptoms of this condition may include:  A feeling of fullness  in the ear.  Ear pain.  Clicking or popping noises in the ear.  Ringing in the ear.  Hearing loss.  Loss of balance.  Symptoms may get worse when the air pressure around you changes, such as when you travel to an area of high elevation or fly on an airplane. How is this diagnosed? This condition may be diagnosed based on:  Your symptoms.  A physical exam of your ear, nose, and throat.  Tests, such as  those that measure: ? The movement of your eardrum (tympanogram). ? Your hearing (audiometry).  How is this treated? Treatment depends on the cause and severity of your condition. If your symptoms are mild, you may be able to relieve your symptoms by moving air into ("popping") your ears. If you have symptoms of fluid in your ears, treatment may include:  Decongestants.  Antihistamines.  Nasal sprays or ear drops that contain medicines that reduce swelling (steroids).  In some cases, you may need to have a procedure to drain the fluid in your eardrum (myringotomy). In this procedure, a small tube is placed in the eardrum to:  Drain the fluid.  Restore the air in the middle ear space.  Follow these instructions at home:  Take over-the-counter and prescription medicines only as told by your health care provider.  Use techniques to help pop your ears as recommended by your health care provider. These may include: ? Chewing gum. ? Yawning. ? Frequent, forceful swallowing. ? Closing your mouth, holding your nose closed, and gently blowing as if you are trying to blow air out of your nose.  Do not do any of the following until your health care provider approves: ? Travel to high altitudes. ? Fly in airplanes. ? Work in a Estate agent or room. ? Scuba dive.  Keep your ears dry. Dry your ears completely after showering or bathing.  Do not smoke.  Keep all follow-up visits as told by your health care provider. This is important. Contact a health care provider if:  Your symptoms do not go away after treatment.  Your symptoms come back after treatment.  You are unable to pop your ears.  You have: ? A fever. ? Pain in your ear. ? Pain in your head or neck. ? Fluid draining from your ear.  Your hearing suddenly changes.  You become very dizzy.  You lose your balance. This information is not intended to replace advice given to you by your health care provider. Make  sure you discuss any questions you have with your health care provider. Document Released: 06/29/2015 Document Revised: 11/08/2015 Document Reviewed: 06/21/2014 Elsevier Interactive Patient Education  [employer].

## 2018-02-19 NOTE — Progress Notes (Signed)
Subjective:  Patient ID: Hannah Ford, female    DOB: Jun 26, 1995  Age: 22 y.o. MRN: [id number]  CC: Cough   HPI Hannah Ford presents for evaluation and treatment of a 7-day history of URI symptoms to include nasal congestion postnasal drip, ear congestion, cough now productive of purulent phlegm and reactive airway disease.  She has had hot spells without fever or chills.  She has no asthma history and does not smoke.  There is no family history of asthma.  She has no facial pressure or teeth pain.  She has been taking DayQuil and NyQuil.  Some years ago she developed a rash with penicillin.  There is been no difficulty breathing throat swelling or hives.  Outpatient Medications Prior to Visit  Medication Sig Dispense Refill  . DiphenhydrAMINE HCl (BENADRYL ALLERGY PO) Take by mouth as needed.    . mometasone (ELOCON) 0.1 % cream APPLY TO AFFECTED AREA 2 TIMES A DAY  0  . Multiple Vitamin (MULTI-VITAMINS) TABS Take by mouth.    . TRI-LEGEST FE 1-20/1-30/1-35 MG-MCG tablet Take 1 tablet by mouth daily.  3   No facility-administered medications prior to visit.     ROS Review of Systems  Constitutional: Positive for fatigue. Negative for chills, fever and unexpected weight change.  HENT: Positive for congestion, hearing loss, postnasal drip and rhinorrhea. Negative for ear pain, sinus pressure, sinus pain, sore throat and trouble swallowing.   Eyes: Negative for photophobia.  Respiratory: Positive for cough and wheezing. Negative for chest tightness and shortness of breath.   Cardiovascular: Negative.   Genitourinary: Negative.   Musculoskeletal: Positive for arthralgias and myalgias.  Skin: Negative for pallor and rash.  Allergic/Immunologic: Negative for immunocompromised state.  Neurological: Negative for headaches.  Hematological: Does not bruise/bleed easily.  Psychiatric/Behavioral: Negative.     Objective:  BP 118/80   Pulse 77   Temp 98 F (36.7 C)   Ht 5' 5.95" (1.675  m)   SpO2 97%   BMI 34.59 kg/m   BP Readings from Last 3 Encounters:  02/19/18 118/80  01/18/18 124/72  10/20/17 108/68    Wt Readings from Last 3 Encounters:  01/18/18 214 lb (97.1 kg)  10/20/17 210 lb 12.8 oz (95.6 kg)  10/19/17 209 lb 12 oz (95.1 kg)    Physical Exam  Constitutional: She is oriented to person, place, and time. She appears well-developed and well-nourished. No distress.  HENT:  Head: Normocephalic and atraumatic.  Right Ear: External ear normal.  Left Ear: External ear normal.  Mouth/Throat: Oropharynx is clear and moist. No oropharyngeal exudate.  Eyes: Pupils are equal, round, and reactive to light. Conjunctivae and EOM are normal. Right eye exhibits no discharge. Left eye exhibits no discharge. No scleral icterus.  Neck: Normal range of motion. Neck supple. No JVD present. No tracheal deviation present. No thyromegaly present.  Cardiovascular: Normal rate, regular rhythm and normal heart sounds.  Pulmonary/Chest: Effort normal and breath sounds normal. No stridor. No respiratory distress. She has no wheezes. She has no rales.  Abdominal: Bowel sounds are normal.  Neurological: She is alert and oriented to person, place, and time.  Skin: Skin is warm and dry. She is not diaphoretic.  Psychiatric: She has a normal mood and affect. Her behavior is normal.    Lab Results  Component Value Date   WBC 7.5 11/09/2016   HGB 13.4 11/09/2016   HCT 41.0 11/09/2016   PLT 235 11/09/2016   GLUCOSE 97 11/09/2016  ALT 23 01/30/2016   AST 24 01/30/2016   NA 139 11/09/2016   K 3.4 (L) 11/09/2016   CL 108 11/09/2016   CREATININE 0.88 11/09/2016   BUN 5 (L) 11/09/2016   CO2 19 (L) 11/09/2016    Dg Chest 2 View  Result Date: 11/09/2016 CLINICAL DATA:  Chest pain and shortness of breath, dizziness, weakness and numbness RIGHT arm EXAM: CHEST  2 VIEW COMPARISON:  None FINDINGS: Normal heart size, mediastinal contours, and pulmonary vascularity. Lungs clear. No  pleural effusion or pneumothorax. Bones unremarkable. IMPRESSION: Normal exam. Electronically Signed   By: Ulyses Southward M.D.   On: 11/09/2016 20:14    Assessment & Plan:   Hannah was seen today for cough.  Diagnoses and all orders for this visit:  Bronchitis -     amoxicillin (AMOXIL) 500 MG capsule; Take 1 capsule (500 mg total) by mouth 3 (three) times daily. -     predniSONE (DELTASONE) 10 MG tablet; Take 1 tablet (10 mg total) by mouth 2 (two) times daily with a meal for 5 days. -     benzonatate (TESSALON) 100 MG capsule; Take 1 capsule (100 mg total) by mouth 2 (two) times daily as needed for cough.  Mild intermittent reactive airway disease with acute exacerbation -     predniSONE (DELTASONE) 10 MG tablet; Take 1 tablet (10 mg total) by mouth 2 (two) times daily with a meal for 5 days.  Dysfunction of both eustachian tubes -     predniSONE (DELTASONE) 10 MG tablet; Take 1 tablet (10 mg total) by mouth 2 (two) times daily with a meal for 5 days.   I am having Hannah Ford start on amoxicillin, predniSONE, and benzonatate. I am also having her maintain her mometasone, MULTI-VITAMINS, TRI-LEGEST FE, and DiphenhydrAMINE HCl (BENADRYL ALLERGY PO).  Meds ordered this encounter  Medications  . amoxicillin (AMOXIL) 500 MG capsule    Sig: Take 1 capsule (500 mg total) by mouth 3 (three) times daily.    Dispense:  30 capsule    Refill:  0  . predniSONE (DELTASONE) 10 MG tablet    Sig: Take 1 tablet (10 mg total) by mouth 2 (two) times daily with a meal for 5 days.    Dispense:  10 tablet    Refill:  0  . benzonatate (TESSALON) 100 MG capsule    Sig: Take 1 capsule (100 mg total) by mouth 2 (two) times daily as needed for cough.    Dispense:  20 capsule    Refill:  0     Follow-up: Return in about 5 days (around 02/24/2018), or if symptoms worsen or fail to improve.  Mliss Sax, MD

## 2018-02-22 ENCOUNTER — Telehealth: Payer: Self-pay | Admitting: Family Medicine

## 2018-02-22 NOTE — Telephone Encounter (Signed)
Copied from CRM [id number]. Topic: Inquiry >> Feb 22, 2018 10:56 AM Maia Petties wrote: Reason for CRM: pt states the benzonatate (TESSALON) 100 MG capsule are not helping control cough - she is asking if she should increase dose or have a change in medication. Pt saw Dr. Doreene Burke 02/19/18. Please advise.

## 2018-02-22 NOTE — Telephone Encounter (Signed)
May take 2 of the tessalon for her cough.

## 2018-02-24 NOTE — Telephone Encounter (Signed)
Left detailed VM for pt with change in dosage.

## 2018-04-12 ENCOUNTER — Ambulatory Visit: Payer: 59 | Admitting: Allergy

## 2018-06-17 ENCOUNTER — Encounter: Payer: Self-pay | Admitting: Family Medicine

## 2018-06-17 ENCOUNTER — Ambulatory Visit (INDEPENDENT_AMBULATORY_CARE_PROVIDER_SITE_OTHER): Payer: BLUE CROSS/BLUE SHIELD | Admitting: Family Medicine

## 2018-06-17 ENCOUNTER — Ambulatory Visit: Payer: BLUE CROSS/BLUE SHIELD | Admitting: Sports Medicine

## 2018-06-17 VITALS — BP 115/76 | HR 77 | Temp 98.6°F | Ht 65.0 in | Wt 212.4 lb

## 2018-06-17 DIAGNOSIS — J329 Chronic sinusitis, unspecified: Secondary | ICD-10-CM

## 2018-06-17 DIAGNOSIS — B9689 Other specified bacterial agents as the cause of diseases classified elsewhere: Secondary | ICD-10-CM

## 2018-06-17 MED ORDER — AMOXICILLIN-POT CLAVULANATE 875-125 MG PO TABS: 1.0000 | ORAL_TABLET | Freq: Two times a day (BID) | ORAL | 0 refills | Status: AC

## 2018-06-17 NOTE — Progress Notes (Signed)
PCP: Dianne DunAron, Talia M, MD  Subjective:  Hannah Ford is a 23 y.o. year old very pleasant female patient who presents with sinusitis symptoms including nasal congestion, sinus tenderness-both frontal and maxillary sinuses -other symptoms include: cough. Yellow discharge from nose with occasional blood. Coughing up yellow sputum as well. No shortness of breath. No fever. Feels pressure in sinuses.  -day of illness:Congestion has been going on at least 2 weeks, cough worsening over last week.   -Symptoms are worsening as noted above -previous treatments: Mucinex-some mild relief with this -sick contacts/travel/risks: denies flu exposure.  -Hx of: Bronchitis last year-she try to tough out similar symptoms last year but progressed to bronchitis and she wants to be more proactive this year.  ROS-denies fever, SOB, NVD, tooth pain  Pertinent Past Medical History-  Patient Active Problem List   Diagnosis Date Noted  . Acute left ankle pain 01/18/2018  . Upper respiratory infection 10/20/2017  . Tonsil stone 09/24/2016  . Hematuria 05/21/2016  . Back pain 05/21/2016  . Polyarthralgia 11/19/2015  . Chest pain 11/19/2015  . GERD (gastroesophageal reflux disease) 11/19/2015  . Arthralgia 11/14/2015  . Menorrhagia 02/20/2015   Medications- reviewed  Current Outpatient Medications  Medication Sig Dispense Refill  . DiphenhydrAMINE HCl (BENADRYL ALLERGY PO) Take by mouth as needed.    . Multiple Vitamin (MULTI-VITAMINS) TABS Take by mouth.    . TRI-LEGEST FE 1-20/1-30/1-35 MG-MCG tablet Take 1 tablet by mouth daily.  3   No current facility-administered medications for this visit.     Objective: BP 115/76 (BP Location: Left Arm, Patient Position: Sitting, Cuff Size: Large)   Pulse 77   Temp 98.6 F (37 C) (Oral)   Ht 5\' 5"  (1.651 m)   Wt 212 lb 6.4 oz (96.3 kg)   SpO2 99%   BMI 35.35 kg/m  Gen: NAD, resting comfortably HEENT: Turbinates erythematous with yellow drainage, TM normal,  pharynx mildly erythematous with no tonsilar exudate or edema, bilateral maxillary sinus tenderness CV: RRR no murmurs rubs or gallops Lungs: CTAB no crackles, wheeze, rhonchi Abdomen: Overweight Ext: no edema Skin: warm, dry, no rash  Assessment/Plan:  Sinsusitis Bacterial based on: Symptoms >10 days  Treatment: -considered steroid: we opted out for now -other symptomatic care with mucinex if desired -Antibiotic indicated: yes, patient reports a childhood reaction to penicillin but she tolerated amoxicillin well last year.  We discussed using doxycycline as alternate but given her good response to amoxicillin last year and she would prefer to try Augmentin at this time.  She knows to stop medication and seek care if she has a reaction to the Augmentin  Finally, we reviewed reasons to return to care including if symptoms worsen or persist or new concerns arise (particularly fever or shortness of breath)  Meds ordered this encounter  Medications  . amoxicillin-clavulanate (AUGMENTIN) 875-125 MG tablet    Sig: Take 1 tablet by mouth 2 (two) times daily for 7 days.    Dispense:  14 tablet    Refill:  0    Tana ConchStephen Krishna Heuer, MD

## 2018-06-17 NOTE — Patient Instructions (Signed)
Sinsusitis Bacterial based on: Symptoms >10 days  Treatment: -considered steroid: we opted out for now -other symptomatic care with mucinex if desired -Antibiotic indicated: yes  Finally, we reviewed reasons to return to care including if symptoms worsen or persist or new concerns arise (particularly fever or shortness of breath)  Meds ordered this encounter  Medications  . amoxicillin-clavulanate (AUGMENTIN) 875-125 MG tablet    Sig: Take 1 tablet by mouth 2 (two) times daily for 7 days.    Dispense:  14 tablet    Refill:  0

## 2018-07-05 ENCOUNTER — Encounter (HOSPITAL_COMMUNITY): Payer: Self-pay

## 2018-07-05 ENCOUNTER — Emergency Department (HOSPITAL_COMMUNITY): Payer: BLUE CROSS/BLUE SHIELD

## 2018-07-05 ENCOUNTER — Other Ambulatory Visit: Payer: Self-pay

## 2018-07-05 ENCOUNTER — Ambulatory Visit (INDEPENDENT_AMBULATORY_CARE_PROVIDER_SITE_OTHER)
Admission: EM | Admit: 2018-07-05 | Discharge: 2018-07-05 | Disposition: A | Discharge status: Home / Self Care | Payer: BLUE CROSS/BLUE SHIELD | Source: Home / Self Care | Attending: Internal Medicine | Admitting: Internal Medicine

## 2018-07-05 ENCOUNTER — Emergency Department (HOSPITAL_COMMUNITY)
Admission: EM | Admit: 2018-07-05 | Discharge: 2018-07-05 | Disposition: A | Discharge status: Home / Self Care | Payer: BLUE CROSS/BLUE SHIELD | Attending: Emergency Medicine | Admitting: Emergency Medicine

## 2018-07-05 DIAGNOSIS — Z79899 Other long term (current) drug therapy: Secondary | ICD-10-CM | POA: Insufficient documentation

## 2018-07-05 DIAGNOSIS — Z91040 Latex allergy status: Secondary | ICD-10-CM | POA: Diagnosis not present

## 2018-07-05 DIAGNOSIS — R002 Palpitations: Secondary | ICD-10-CM

## 2018-07-05 DIAGNOSIS — R0602 Shortness of breath: Secondary | ICD-10-CM

## 2018-07-05 LAB — I-STAT BETA HCG BLOOD, ED (MC, WL, AP ONLY): I-stat hCG, quantitative: <5 m[IU]/mL (ref <5)

## 2018-07-05 LAB — CBC
HCT: 40 % (ref 36.0–46.0)
Hemoglobin: 12.4 g/dL (ref 12.0–15.0)
MCH: 27.1 pg (ref 26.0–34.0)
MCHC: 31 g/dL (ref 30.0–36.0)
MCV: 87.3 fL (ref 80.0–100.0)
Platelets: 211 10*3/uL (ref 150–400)
RBC: 4.58 MIL/uL (ref 3.87–5.11)
RDW: 13.1 % (ref 11.5–15.5)
WBC: 6.8 10*3/uL (ref 4.0–10.5)
nRBC: 0 % (ref 0.0–0.2)

## 2018-07-05 LAB — BASIC METABOLIC PANEL
Anion gap: 10 (ref 5–15)
BUN: 10 mg/dL (ref 6–20)
CO2: 23 mmol/L (ref 22–32)
Calcium: 9.4 mg/dL (ref 8.9–10.3)
Chloride: 108 mmol/L (ref 98–111)
Creatinine, Ser: 0.94 mg/dL (ref 0.44–1.00)
Glucose, Bld: 80 mg/dL (ref 70–99)
Potassium: 3.8 mmol/L (ref 3.5–5.1)
Sodium: 141 mmol/L (ref 135–145)

## 2018-07-05 LAB — I-STAT TROPONIN, ED: Troponin i, poc: 0 ng/mL (ref 0.00–0.08)

## 2018-07-05 LAB — D-DIMER, QUANTITATIVE: D-Dimer, Quant: <0.27 ug/mL-FEU (ref 0.00–0.50)

## 2018-07-05 LAB — TSH: TSH: 2.538 u[IU]/mL (ref 0.350–4.500)

## 2018-07-05 NOTE — ED Provider Notes (Signed)
[facility]    CSN: [id number] Arrival date & time: 07/05/18  1858     History   Chief Complaint Chief Complaint  Patient presents with  . Palpitations    HPI Hannah Ford is a 23 y.o. female.   23 year old female with past medical history of polyarthralgias as well as atypical chest pain and GERD presents to the urgent care complaining of palpitations.  She states that her palpitations began last night and at one point were so severe that she felt difficulty catching her breath.  The patient also had a subjective fever a day ago.  She has had a number of symptoms over the last few days that started with swelling in her left leg and pain behind her right knee 3 days ago.  She then developed some diarrhea.  Of note she states that she may have had some hemorrhoid blood due to a scant amount of bright red blood on her tissue paper.  Now the patient admits that she has a vague sense of discomfort when taking a deep breath.  She denies nausea, vomiting or diaphoresis.  She also denies miscarriages and family history of blood clots.  Notably, the patient changed birth control approximately 6 months ago.  She does not smoke.       Past Medical History:  Diagnosis Date  . Abnormal menstrual periods   . Allergy   . Frequent headaches    otc med prn  . GERD (gastroesophageal reflux disease)   . Urticaria     Patient Active Problem List   Diagnosis Date Noted  . Acute left ankle pain 01/18/2018  . Upper respiratory infection 10/20/2017  . Tonsil stone 09/24/2016  . Hematuria 05/21/2016  . Back pain 05/21/2016  . Polyarthralgia 11/19/2015  . Chest pain 11/19/2015  . GERD (gastroesophageal reflux disease) 11/19/2015  . Arthralgia 11/14/2015  . Menorrhagia 02/20/2015    Past Surgical History:  Procedure Laterality Date  . LAPAROSCOPY N/A 02/14/2016   Procedure: LAPAROSCOPY DIAGNOSTIC FULGERATION OF PELVIC ENDOMETRIOSIS;  Surgeon: Maxie Better, MD;  Location: [facility];  Service: Gynecology;  Laterality: N/A;  @ 2hrs.  . WISDOM TOOTH EXTRACTION      OB History   No obstetric history on file.      Home Medications    Prior to Admission medications   Medication Sig Start Date End Date Taking? Authorizing Provider  DiphenhydrAMINE HCl (BENADRYL ALLERGY PO) Take by mouth as needed.    [provider]  Multiple Vitamin (MULTI-VITAMINS) TABS Take by mouth.    [provider]  TRI-LEGEST FE 1-20/1-30/1-35 MG-MCG tablet Take 1 tablet by mouth daily. 08/30/17   [provider]    Family History Family History  Problem Relation Age of Onset  . Eczema Mother   . Allergic rhinitis Mother   . Hyperlipidemia Maternal Aunt        x 2  . Food Allergy Maternal Aunt   . Heart disease Maternal Grandmother   . Hypertension Maternal Grandmother   . Hypertension Maternal Grandfather   . Colon cancer Neg Hx   . Esophageal cancer Neg Hx   . Stomach cancer Neg Hx   . Pancreatic cancer Neg Hx   . Liver disease Neg Hx   . Inflammatory bowel disease Neg Hx     Social History Social History   Tobacco Use  . Smoking status: Never Smoker  . Smokeless tobacco: Never Used  Substance Use Topics  . Alcohol use: No  .  Drug use: No     Allergies   Latex; Zithromax [azithromycin]; and Penicillins   Review of Systems Review of Systems  Constitutional: Negative for chills and fever.  HENT: Negative for sore throat and tinnitus.   Eyes: Negative for redness.  Respiratory: Positive for shortness of breath. Negative for cough.   Cardiovascular: Negative for chest pain and palpitations.  Gastrointestinal: Negative for abdominal pain, diarrhea, nausea and vomiting.  Genitourinary: Negative for dysuria, frequency and urgency.  Musculoskeletal: Negative for myalgias.  Skin: Negative for rash.       No lesions  Neurological: Negative for weakness.  Hematological: Does not bruise/bleed easily.  Psychiatric/Behavioral: Negative for  suicidal ideas.     Physical Exam Triage Vital Signs ED Triage Vitals  Enc Vitals Group     BP 07/05/18 1907 138/64     Pulse Rate 07/05/18 1907 72     Resp 07/05/18 1907 18     Temp 07/05/18 1907 98.2 F (36.8 C)     Temp Source 07/05/18 1907 Tympanic     SpO2 07/05/18 1907 100 %     Weight 07/05/18 1909 215 lb (97.5 kg)     Height --      Head Circumference --      Peak Flow --      Pain Score 07/05/18 1908 2     Pain Loc --      Pain Edu? --      Excl. in GC? --    No data found.  Updated Vital Signs BP 138/64 (BP Location: Right Arm)   Pulse 72   Temp 98.2 F (36.8 C) (Tympanic)   Resp 18   Wt 97.5 kg   LMP 07/05/2018   SpO2 100%   BMI 35.78 kg/m   Visual Acuity Right Eye Distance:   Left Eye Distance:   Bilateral Distance:    Right Eye Near:   Left Eye Near:    Bilateral Near:     Physical Exam Vitals signs and nursing note reviewed.  Constitutional:      General: She is not in acute distress.    Appearance: She is well-developed.  HENT:     Head: Normocephalic and atraumatic.  Eyes:     General: No scleral icterus.    Conjunctiva/sclera: Conjunctivae normal.     Pupils: Pupils are equal, round, and reactive to light.  Neck:     Musculoskeletal: Normal range of motion and neck supple.     Thyroid: No thyromegaly.     Vascular: No JVD.     Trachea: No tracheal deviation.  Cardiovascular:     Rate and Rhythm: Regular rhythm. Tachycardia present.     Heart sounds: Normal heart sounds. No murmur. No friction rub. No gallop.   Pulmonary:     Effort: Pulmonary effort is normal.     Breath sounds: Normal breath sounds.  Abdominal:     General: Bowel sounds are normal. There is no distension.     Palpations: Abdomen is soft.     Tenderness: There is no abdominal tenderness.  Musculoskeletal: Normal range of motion.  Lymphadenopathy:     Cervical: No cervical adenopathy.  Skin:    General: Skin is warm and dry.     Findings: Rash present.      Comments: Livedo reticularis on chest  Neurological:     Mental Status: She is alert and oriented to person, place, and time.     Cranial Nerves: No cranial nerve deficit.  Psychiatric:        Behavior: Behavior normal.        Thought Content: Thought content normal.        Judgment: Judgment normal.      UC Treatments / Results  Labs (all labs ordered are listed, but only abnormal results are displayed) Labs Reviewed - No data to display  EKG None  Radiology No results found.  Procedures Procedures (including critical care time)  Medications Ordered in UC Medications - No data to display  Initial Impression / Assessment and Plan / UC Course  I have reviewed the triage vital signs and the nursing notes.  Pertinent labs & imaging results that were available during my care of the patient were reviewed by me and considered in my medical decision making (see chart for details).     Review of EKG shows left atrial enlargement as well as an S wave in lead I and a tiny Q wave in lead III.  Normal rate.  Full pattern is not there but the patient's symptoms (minus diarrhea and hemorrhoidal bleed) are concerning for PE.  I have advised the patient to go to the emergency department for CTA of her chest.  Final Clinical Impressions(s) / UC Diagnoses   Final diagnoses:  Palpitations     Discharge Instructions     Please go to the ED to be evaluated for possible pulmonary embolism   ED Prescriptions    None     Controlled Substance Prescriptions [place] Controlled Substance Registry consulted? Not Applicable   Arnaldo Nataliamond, Draper Gallon S, MD 07/05/18 2020

## 2018-07-05 NOTE — ED Triage Notes (Addendum)
Pt c/o palpitations that started yesterday. and pain in left leg (was having pain Fri-Sun in right leg). Denies SOB, long distance travel, hx of blood clots. EKG was completed at [facility] and sent immediately over to r/o PE.

## 2018-07-05 NOTE — ED Notes (Signed)
Provider at bedside

## 2018-07-05 NOTE — Discharge Instructions (Signed)
Please go to the ED to be evaluated for possible pulmonary embolism

## 2018-07-05 NOTE — ED Notes (Signed)
Patient verbalizes understanding of discharge instructions. Opportunity for questioning and answers were provided. Armband removed by staff, pt discharged from ED ambulatory.   

## 2018-07-05 NOTE — ED Notes (Signed)
Pt. returned from XR. 

## 2018-07-05 NOTE — ED Provider Notes (Signed)
[facility] Provider Note   CSN: [id number] Arrival date & time: 07/05/18  2030     History   Chief Complaint Chief Complaint  Patient presents with  . Palpitations    HPI [place] B Azucena KubaReid is a 23 y.o. female.  The history is provided by the patient.  Palpitations  Palpitations quality:  Fast Onset quality:  At rest Timing:  Intermittent Progression:  Waxing and waning Chronicity:  New Context: not anxiety, not appetite suppressants, not caffeine, not dehydration, not illicit drugs and not nicotine   Relieved by:  Bed rest Associated symptoms: no back pain, no chest pain, no chest pressure, no cough, no dizziness, no hemoptysis, no leg pain, no lower extremity edema, no malaise/fatigue, no nausea, no near-syncope, no numbness, no orthopnea, no PND, no shortness of breath, no syncope, no vomiting and no weakness   Risk factors: no diabetes mellitus, no hx of DVT, no hx of PE and no hyperthyroidism     Past Medical History:  Diagnosis Date  . Abnormal menstrual periods   . Allergy   . Frequent headaches    otc med prn  . GERD (gastroesophageal reflux disease)   . Urticaria     Patient Active Problem List   Diagnosis Date Noted  . Acute left ankle pain 01/18/2018  . Upper respiratory infection 10/20/2017  . Tonsil stone 09/24/2016  . Hematuria 05/21/2016  . Back pain 05/21/2016  . Polyarthralgia 11/19/2015  . Chest pain 11/19/2015  . GERD (gastroesophageal reflux disease) 11/19/2015  . Arthralgia 11/14/2015  . Menorrhagia 02/20/2015    Past Surgical History:  Procedure Laterality Date  . LAPAROSCOPY N/A 02/14/2016   Procedure: LAPAROSCOPY DIAGNOSTIC FULGERATION OF PELVIC ENDOMETRIOSIS;  Surgeon: Maxie BetterSheronette Cousins, MD;  Location: [facility];  Service: Gynecology;  Laterality: N/A;  @ 2hrs.  . WISDOM TOOTH EXTRACTION       OB History   No obstetric history on file.      Home Medications    Prior to Admission medications     Medication Sig Start Date End Date Taking? Authorizing Provider  BIOTIN PO Take 1 tablet by mouth daily.   Yes [provider]  diphenhydrAMINE (BENADRYL) 25 MG tablet Take 25 mg by mouth every 6 (six) hours as needed for allergies.   Yes [provider]  Multiple Vitamin (MULTIVITAMIN WITH MINERALS) TABS tablet Take 1 tablet by mouth daily.   Yes [provider]  TRI-LEGEST FE 1-20/1-30/1-35 MG-MCG tablet Take 1 tablet by mouth daily. 08/30/17  Yes [provider]    Family History Family History  Problem Relation Age of Onset  . Eczema Mother   . Allergic rhinitis Mother   . Hyperlipidemia Maternal Aunt        x 2  . Food Allergy Maternal Aunt   . Heart disease Maternal Grandmother   . Hypertension Maternal Grandmother   . Hypertension Maternal Grandfather   . Colon cancer Neg Hx   . Esophageal cancer Neg Hx   . Stomach cancer Neg Hx   . Pancreatic cancer Neg Hx   . Liver disease Neg Hx   . Inflammatory bowel disease Neg Hx     Social History Social History   Tobacco Use  . Smoking status: Never Smoker  . Smokeless tobacco: Never Used  Substance Use Topics  . Alcohol use: No  . Drug use: No     Allergies   Latex; Zithromax [azithromycin]; and Penicillins   Review of Systems  Review of Systems  Constitutional: Negative for chills, fever and malaise/fatigue.  HENT: Negative for ear pain and sore throat.   Eyes: Negative for pain and visual disturbance.  Respiratory: Negative for cough, hemoptysis and shortness of breath.   Cardiovascular: Positive for palpitations. Negative for chest pain, orthopnea, syncope, PND and near-syncope.  Gastrointestinal: Negative for abdominal pain, nausea and vomiting.  Genitourinary: Negative for dysuria and hematuria.  Musculoskeletal: Negative for arthralgias and back pain.  Skin: Negative for color change and rash.  Neurological: Negative for dizziness, seizures, syncope, weakness and numbness.   All other systems reviewed and are negative.    Physical Exam Updated Vital Signs BP 119/63   Pulse 73   Temp 98.7 F (37.1 C) (Oral)   Resp 18   Ht 5\' 6"  (1.676 m)   Wt 97.5 kg   SpO2 100%   BMI 34.70 kg/m   Physical Exam Vitals signs and nursing note reviewed.  Constitutional:      General: She is not in acute distress.    Appearance: She is well-developed.  HENT:     Head: Normocephalic and atraumatic.     Nose: Nose normal.     Mouth/Throat:     Mouth: Mucous membranes are moist.  Eyes:     Conjunctiva/sclera: Conjunctivae normal.     Pupils: Pupils are equal, round, and reactive to light.  Neck:     Musculoskeletal: Normal range of motion and neck supple.  Cardiovascular:     Rate and Rhythm: Normal rate and regular rhythm.     Pulses: Normal pulses.     Heart sounds: Normal heart sounds. No murmur.  Pulmonary:     Effort: Pulmonary effort is normal. No respiratory distress.     Breath sounds: Normal breath sounds.  Abdominal:     General: Abdomen is flat.     Palpations: Abdomen is soft.     Tenderness: There is no abdominal tenderness.  Musculoskeletal: Normal range of motion.  Skin:    General: Skin is warm and dry.     Capillary Refill: Capillary refill takes less than 2 seconds.  Neurological:     General: No focal deficit present.     Mental Status: She is alert and oriented to person, place, and time.     Cranial Nerves: No cranial nerve deficit.     Sensory: No sensory deficit.     Motor: No weakness.     Coordination: Coordination normal.     Gait: Gait normal.      ED Treatments / Results  Labs (all labs ordered are listed, but only abnormal results are displayed) Labs Reviewed  BASIC METABOLIC PANEL  CBC  D-DIMER, QUANTITATIVE (NOT AT [facility])  TSH  I-STAT TROPONIN, ED  I-STAT BETA HCG BLOOD, ED ([facility], [facility], [facility] ONLY)    EKG EKG Interpretation  Date/Time:  Monday July 05 2018 22:00:40 EST Ventricular Rate:  76 PR Interval:     QRS Duration: 94 QT Interval:  386 QTC Calculation: 434 R Axis:   96 Text Interpretation:  Sinus rhythm Borderline right axis deviation Low voltage, precordial leads Confirmed by Virgina Norfolk [phone]) on 07/06/2018 1:26:57 AM   Radiology Dg Chest 2 View  Result Date: 07/05/2018 CLINICAL DATA:  Palpitation EXAM: CHEST - 2 VIEW COMPARISON:  11/09/2016 FINDINGS: The heart size and mediastinal contours are within normal limits. Both lungs are clear. The visualized skeletal structures are unremarkable. IMPRESSION: No active cardiopulmonary disease. Electronically Signed   By: Jasmine Pang  M.D.   On: 07/05/2018 22:59    Procedures Procedures (including critical care time)  Medications Ordered in ED Medications - No data to display   Initial Impression / Assessment and Plan / ED Course  I have reviewed the triage vital signs and the nursing notes.  Pertinent labs & imaging results that were available during my care of the patient were reviewed by me and considered in my medical decision making (see chart for details).     Mandeep B Legere is a 23 year old female no significant medical history who presents to the ED with palpitations.  Patient with normal vitals.  No fever.  Patient with intermittent palpitations today.  No syncope.  No chest pain, no shortness of breath.  Patient is on estrogen pill for birth control.  Otherwise no DVT or PE risk factors.  No leg swelling.  Overall asymptomatic now.  EKG shows sinus rhythm. No ischemic changes.  Chest x-ray showed no signs of pneumonia, pneumothorax, pleural effusion.  D-dimer within normal limits and doubt PE.  Troponin within normal limits.  Doubt cardiac process.  No history of sudden cardiac death in the family.  No signs of arrhythmia on EKG.  Patient with no significant leukocytosis, anemia, electrolyte abnormality.  TSH ordered for primary care follow-up.  Negative pregnancy test.  Patient without any urinary symptoms.  Neurologically  intact. No sudden cardiac death in family and no syncope per patient, doubt HOCM, brugada. No prolong qtc.  Suspect likely anxiety versus PVCs.  Recommend follow-up with primary care doctor for possible heart monitor, TSH.  Given return precautions and discharged in ED in good condition.  This chart was dictated using voice recognition software.  Despite best efforts to proofread,  errors can occur which can change the documentation meaning.   Final Clinical Impressions(s) / ED Diagnoses   Final diagnoses:  Palpitations    ED Discharge Orders    None       Virgina Norfolk, DO 07/06/18 0129

## 2018-07-05 NOTE — ED Triage Notes (Signed)
Pt cc she has been having palpations. Pt has been having pain in her right leg. This has been happening off and on .

## 2018-07-05 NOTE — ED Notes (Signed)
Patient transported to X-ray 

## 2018-08-17 DIAGNOSIS — Z6834 Body mass index (BMI) 34.0-34.9, adult: Secondary | ICD-10-CM | POA: Diagnosis not present

## 2018-08-17 DIAGNOSIS — Z01419 Encounter for gynecological examination (general) (routine) without abnormal findings: Secondary | ICD-10-CM | POA: Diagnosis not present

## 2018-10-27 ENCOUNTER — Telehealth: Payer: Self-pay | Admitting: Family Medicine

## 2018-10-27 NOTE — Telephone Encounter (Signed)
I called patient to follow up and to see if patient needed to scheduled appointment. Patient informed me that she does not need an appointment at this time and will call back.

## 2019-02-15 ENCOUNTER — Ambulatory Visit (INDEPENDENT_AMBULATORY_CARE_PROVIDER_SITE_OTHER): Payer: Self-pay | Admitting: Family Medicine

## 2019-02-15 DIAGNOSIS — R0981 Nasal congestion: Secondary | ICD-10-CM

## 2019-02-15 DIAGNOSIS — Z20822 Contact with and (suspected) exposure to covid-19: Secondary | ICD-10-CM | POA: Insufficient documentation

## 2019-02-15 DIAGNOSIS — R509 Fever, unspecified: Secondary | ICD-10-CM

## 2019-02-15 DIAGNOSIS — R5381 Other malaise: Secondary | ICD-10-CM

## 2019-02-15 DIAGNOSIS — Z20828 Contact with and (suspected) exposure to other viral communicable diseases: Secondary | ICD-10-CM

## 2019-02-15 DIAGNOSIS — R0602 Shortness of breath: Secondary | ICD-10-CM

## 2019-02-15 NOTE — Progress Notes (Signed)
Virtual Visit via Video   Due to the COVID-19 pandemic, this visit was completed with telemedicine (audio/video) technology to reduce patient and provider exposure as well as to preserve personal protective equipment.   I connected with Somalia B Formanek by a video enabled telemedicine application and verified that I am speaking with the correct person using two identifiers. Location patient: Home Location provider: [facility], Office Persons participating in the virtual visit: [place] B Elizebeth Brooking, MD   I discussed the limitations of evaluation and management by telemedicine and the availability of in person appointments. The patient expressed understanding and agreed to proceed.  Care Team   Patient Care Team: Lucille Passy, MD as PCP - General (Family Medicine)  Subjective:   HPI:   She complains of fever runny nose, congestion over the weekend. Yesterday she started with a severe headache frontal and back of head. Started getting out of breath and increasing fatigue. She has to shallow breathe. She feels like she has to sleep a lot but cannot sleep due to her headache and pressure. Last night started with chills. She did a COVID test in her mouth yesterday and it was negative but she is having a hard time with her symptoms.  Covid test was a mouth swab. Did have a positive contact at work.  Review of Systems  Constitutional: Positive for chills, fever and malaise/fatigue.  HENT: Positive for congestion and sinus pain.   Respiratory: Positive for shortness of breath. Negative for cough and sputum production.   Cardiovascular: Negative.   Musculoskeletal: Negative.   Skin: Negative.   Neurological: Negative.   Endo/Heme/Allergies: Negative.   Psychiatric/Behavioral: Negative.   All other systems reviewed and are negative.    Patient Active Problem List   Diagnosis Date Noted  . Close Exposure to Covid-19 Virus 02/15/2019  . Suspected Covid-19 Virus Infection 02/15/2019   . Acute left ankle pain 01/18/2018  . Upper respiratory infection 10/20/2017  . Tonsil stone 09/24/2016  . Hematuria 05/21/2016  . Back pain 05/21/2016  . Polyarthralgia 11/19/2015  . Chest pain 11/19/2015  . GERD (gastroesophageal reflux disease) 11/19/2015  . Arthralgia 11/14/2015  . Menorrhagia 02/20/2015    Social History   Tobacco Use  . Smoking status: Never Smoker  . Smokeless tobacco: Never Used  Substance Use Topics  . Alcohol use: No    Current Outpatient Medications:  .  BIOTIN PO, Take 1 tablet by mouth daily., Disp: , Rfl:  .  diphenhydrAMINE (BENADRYL) 25 MG tablet, Take 25 mg by mouth every 6 (six) hours as needed for allergies., Disp: , Rfl:  .  Multiple Vitamin (MULTIVITAMIN WITH MINERALS) TABS tablet, Take 1 tablet by mouth daily., Disp: , Rfl:  .  TRI-LEGEST FE 1-20/1-30/1-35 MG-MCG tablet, Take 1 tablet by mouth daily., Disp: , Rfl: 3  Allergies  Allergen Reactions  . Latex Other (See Comments)    unknown Other reaction(s): Other (See Comments) unknown   . Zithromax [Azithromycin] Other (See Comments)    Face redness and swelling  . Penicillins Rash    Has patient had a PCN reaction causing immediate rash, facial/tongue/throat swelling, SOB or lightheadedness with hypotension: Yes Has patient had a PCN reaction causing severe rash involving mucus membranes or skin necrosis: Yes Has patient had a PCN reaction that required hospitalization No Has patient had a PCN reaction occurring within the last 10 years: No If all of the above answers are "NO", then may proceed with Cephalosporin use.  Objective:  Temp 99.9 F (37.7 C) (Oral)   VITALS: Per patient if applicable, see vitals. GENERAL: Alert, appears well and in no acute distress. HEENT: Atraumatic, conjunctiva clear, no obvious abnormalities on inspection of external nose and ears. NECK: Normal movements of the head and neck. CARDIOPULMONARY: No increased WOB. Speaking in clear sentences but  it does look like she gets winded while talking. I:E ratio WNL.  MS: Moves all visible extremities without noticeable abnormality. PSYCH: Pleasant and cooperative, well-groomed. Speech normal rate and rhythm. Affect is appropriate. Insight and judgement are appropriate. Attention is focused, linear, and appropriate.  NEURO: CN grossly intact. Oriented as arrived to appointment on time with no prompting. Moves both UE equally.  SKIN: No obvious lesions, wounds, erythema, or cyanosis noted on face or hands.  Depression screen PHQ 2/9 10/20/2017  Decreased Interest 0  Down, Depressed, Hopeless 0  PHQ - 2 Score 0    Assessment and Plan:   [place] was seen today for fever.  Diagnoses and all orders for this visit:  Close Exposure to Covid-19 Virus  Suspected Covid-19 Virus Infection    . COVID-19 Education: The signs and symptoms of COVID-19 were discussed with the patient and how to seek care for testing if needed. The importance of social distancing was discussed today. . Reviewed expectations re: course of current medical issues. . Discussed self-management of symptoms. . Outlined signs and symptoms indicating need for more acute intervention. . Patient verbalized understanding and all questions were answered. Marland Kitchen. Health Maintenance issues including appropriate healthy diet, exercise, and smoking avoidance were discussed with patient. . See orders for this visit as documented in the electronic medical record.  Ruthe Mannanalia , MD  Records requested if needed. Time spent: 25 minutes, of which >50% was spent in obtaining information about her symptoms, reviewing her previous labs, evaluations, and treatments, counseling her about her condition (please see the discussed topics above), and developing a plan to further investigate it; she had a number of questions which I addressed.

## 2019-02-15 NOTE — Assessment & Plan Note (Signed)
>  25 minutes spent in face to face time with patient, >50% spent in counselling or coordination of care. I explained to pt that your covid test could have been false negative- was a mouth swab and if early on without enough viral load, test would have been negative.  I explained the urgency of which she needed to go to ED now that she is having difficulty breathing.  She called her dad who is taking her to ED now.  I gave her my cell number so she could text and or call with updates. The patient indicates understanding of these issues and agrees with the plan.

## 2019-06-09 ENCOUNTER — Other Ambulatory Visit: Payer: Self-pay

## 2019-06-09 ENCOUNTER — Encounter: Payer: Self-pay | Admitting: Family Medicine

## 2019-06-09 DIAGNOSIS — J069 Acute upper respiratory infection, unspecified: Secondary | ICD-10-CM

## 2019-06-09 DIAGNOSIS — J40 Bronchitis, not specified as acute or chronic: Secondary | ICD-10-CM

## 2019-06-09 MED ORDER — FLUTICASONE-SALMETEROL 250-50 MCG/DOSE IN AEPB: 1.0000 | INHALATION_SPRAY | Freq: Two times a day (BID) | RESPIRATORY_TRACT | 3 refills | Status: DC

## 2019-06-09 NOTE — Telephone Encounter (Signed)
Inhaler sent in. Pt scheduled 12/28.

## 2019-06-12 DIAGNOSIS — R05 Cough: Secondary | ICD-10-CM | POA: Insufficient documentation

## 2019-06-12 DIAGNOSIS — R053 Chronic cough: Secondary | ICD-10-CM | POA: Insufficient documentation

## 2019-06-12 NOTE — Progress Notes (Deleted)
Virtual Visit via Video   Due to the COVID-19 pandemic, this visit was completed with telemedicine (audio/video) technology to reduce patient and provider exposure as well as to preserve personal protective equipment.   I connected with Greenland B Huertas by a video enabled telemedicine application and verified that I am speaking with the correct person using two identifiers. Location patient: Home Location provider: [facility], Office Persons participating in the virtual visit: Greenland B Everitt Amber, MD   I discussed the limitations of evaluation and management by telemedicine and the availability of in person appointments. The patient expressed understanding and agreed to proceed.  Care Team   Patient Care Team: Dianne Dun, MD as PCP - General (Family Medicine)  Subjective:   HPI:   ROS   Patient Active Problem List   Diagnosis Date Noted  . Close exposure to COVID-19 virus 02/15/2019  . Suspected COVID-19 virus infection 02/15/2019  . Acute left ankle pain 01/18/2018  . Upper respiratory infection 10/20/2017  . Tonsil stone 09/24/2016  . Hematuria 05/21/2016  . Back pain 05/21/2016  . Polyarthralgia 11/19/2015  . Chest pain 11/19/2015  . GERD (gastroesophageal reflux disease) 11/19/2015  . Arthralgia 11/14/2015  . Menorrhagia 02/20/2015    Social History   Tobacco Use  . Smoking status: Never Smoker  . Smokeless tobacco: Never Used  Substance Use Topics  . Alcohol use: No    Current Outpatient Medications:  .  BIOTIN PO, Take 1 tablet by mouth daily., Disp: , Rfl:  .  diphenhydrAMINE (BENADRYL) 25 MG tablet, Take 25 mg by mouth every 6 (six) hours as needed for allergies., Disp: , Rfl:  .  Fluticasone-Salmeterol (ADVAIR DISKUS) 250-50 MCG/DOSE AEPB, Inhale 1 puff into the lungs 2 (two) times daily., Disp: 1 each, Rfl: 3 .  Multiple Vitamin (MULTIVITAMIN WITH MINERALS) TABS tablet, Take 1 tablet by mouth daily., Disp: , Rfl:  .  TRI-LEGEST FE 1-20/1-30/1-35  MG-MCG tablet, Take 1 tablet by mouth daily., Disp: , Rfl: 3  Allergies  Allergen Reactions  . Latex Other (See Comments)    unknown Other reaction(s): Other (See Comments) unknown   . Zithromax [Azithromycin] Other (See Comments)    Face redness and swelling  . Penicillins Rash    Has patient had a PCN reaction causing immediate rash, facial/tongue/throat swelling, SOB or lightheadedness with hypotension: Yes Has patient had a PCN reaction causing severe rash involving mucus membranes or skin necrosis: Yes Has patient had a PCN reaction that required hospitalization No Has patient had a PCN reaction occurring within the last 10 years: No If all of the above answers are "NO", then may proceed with Cephalosporin use.     Objective:   VITALS: Per patient if applicable, see vitals. GENERAL: Alert, appears well and in no acute distress. HEENT: Atraumatic, conjunctiva clear, no obvious abnormalities on inspection of external nose and ears. NECK: Normal movements of the head and neck. CARDIOPULMONARY: No increased WOB. Speaking in clear sentences. I:E ratio WNL.  MS: Moves all visible extremities without noticeable abnormality. PSYCH: Pleasant and cooperative, well-groomed. Speech normal rate and rhythm. Affect is appropriate. Insight and judgement are appropriate. Attention is focused, linear, and appropriate.  NEURO: CN grossly intact. Oriented as arrived to appointment on time with no prompting. Moves both UE equally.  SKIN: No obvious lesions, wounds, erythema, or cyanosis noted on face or hands.  Depression screen PHQ 2/9 10/20/2017  Decreased Interest 0  Down, Depressed, Hopeless 0  PHQ - 2  Score 0     . COVID-19 Education: The signs and symptoms of COVID-19 were discussed with the patient and how to seek care for testing if needed. The importance of social distancing was discussed today. . Reviewed expectations re: course of current medical issues. . Discussed self-management  of symptoms. . Outlined signs and symptoms indicating need for more acute intervention. . Patient verbalized understanding and all questions were answered. Marland Kitchen Health Maintenance issues including appropriate healthy diet, exercise, and smoking avoidance were discussed with patient. . See orders for this visit as documented in the electronic medical record.  Arnette Norris, MD  Records requested if needed. Time spent: *** minutes, of which >50% was spent in obtaining information about her symptoms, reviewing her previous labs, evaluations, and treatments, counseling her about her condition (please see the discussed topics above), and developing a plan to further investigate it; she had a number of questions which I addressed.   Lab Results  Component Value Date   WBC 6.8 07/05/2018   HGB 12.4 07/05/2018   HCT 40.0 07/05/2018   PLT 211 07/05/2018   GLUCOSE 80 07/05/2018   ALT 23 01/30/2016   AST 24 01/30/2016   NA 141 07/05/2018   K 3.8 07/05/2018   CL 108 07/05/2018   CREATININE 0.94 07/05/2018   BUN 10 07/05/2018   CO2 23 07/05/2018   TSH 2.538 07/05/2018    Lab Results  Component Value Date   TSH 2.538 07/05/2018   Lab Results  Component Value Date   WBC 6.8 07/05/2018   HGB 12.4 07/05/2018   HCT 40.0 07/05/2018   MCV 87.3 07/05/2018   PLT 211 07/05/2018   Lab Results  Component Value Date   NA 141 07/05/2018   K 3.8 07/05/2018   CO2 23 07/05/2018   GLUCOSE 80 07/05/2018   BUN 10 07/05/2018   CREATININE 0.94 07/05/2018   BILITOT 0.4 01/30/2016   ALKPHOS 71 01/30/2016   AST 24 01/30/2016   ALT 23 01/30/2016   PROT 7.4 01/30/2016   ALBUMIN 4.1 01/30/2016   CALCIUM 9.4 07/05/2018   ANIONGAP 10 07/05/2018   GFR 99.44 11/14/2015   No results found for: CHOL No results found for: HDL No results found for: LDLCALC No results found for: TRIG No results found for: CHOLHDL No results found for: HGBA1C     Assessment & Plan:   Problem List Items Addressed This  Visit    None      I am having Arnold Line B. Knueppel maintain her Tri-Legest Fe, diphenhydrAMINE, multivitamin with minerals, BIOTIN PO, and Fluticasone-Salmeterol.  No orders of the defined types were placed in this encounter.    Arnette Norris, MD

## 2019-06-12 NOTE — Assessment & Plan Note (Deleted)
The most common causes of chronic cough include the following: upper airway cough syndrome (UACS) which is caused by variety of rhinosinus conditions; asthma; gastroesophageal reflux disease (GERD); chronic bronchitis from cigarette smoking or other inhaled environmental irritants; non-asthmatic eosinophilic bronchitis; and bronchiectasis. In prospective studies, these conditions have accounted for up to 94% of the causes of chronic cough in immunocompetent adults. The history and physical examination suggest that her cough is multifactorial with contribution from ***. We will address these issues at this time.   *** A prescription has been provided for a flutter valve to be used as needed to break the coughing cycle.  Prednisone has been provided, 40 mg x3 days, 20 mg x1 day, 10 mg x1 day, then stop.  A prescription has been provided for Tussionex ER suspension, 5 mL every 12 hours over the next 3 days, then stop.  Treatment plan as outlined {Blank single:19197::"below","above"}.    We will regroup in *** to assess treatment response and adjust therapy accordingly.   {Blank multiple:19196::"uncontrolled asthma","GERD","cigarette exposure","perfumes/irritants","bronchiectasis"}.  - We will address {Blank single:19197::"bronchiectasis","GERD","cigarette exposure","perfumes/irritants","asthma"} at this time.

## 2019-06-13 ENCOUNTER — Telehealth: Payer: Self-pay | Admitting: Family Medicine

## 2019-12-30 IMAGING — CR DG CHEST 2V
2 series · 2 of 2 positions shown · non-contrast
Comparison: 11/09/2016

CLINICAL DATA: Palpitation

EXAM:
CHEST - 2 VIEW

[chest lat]
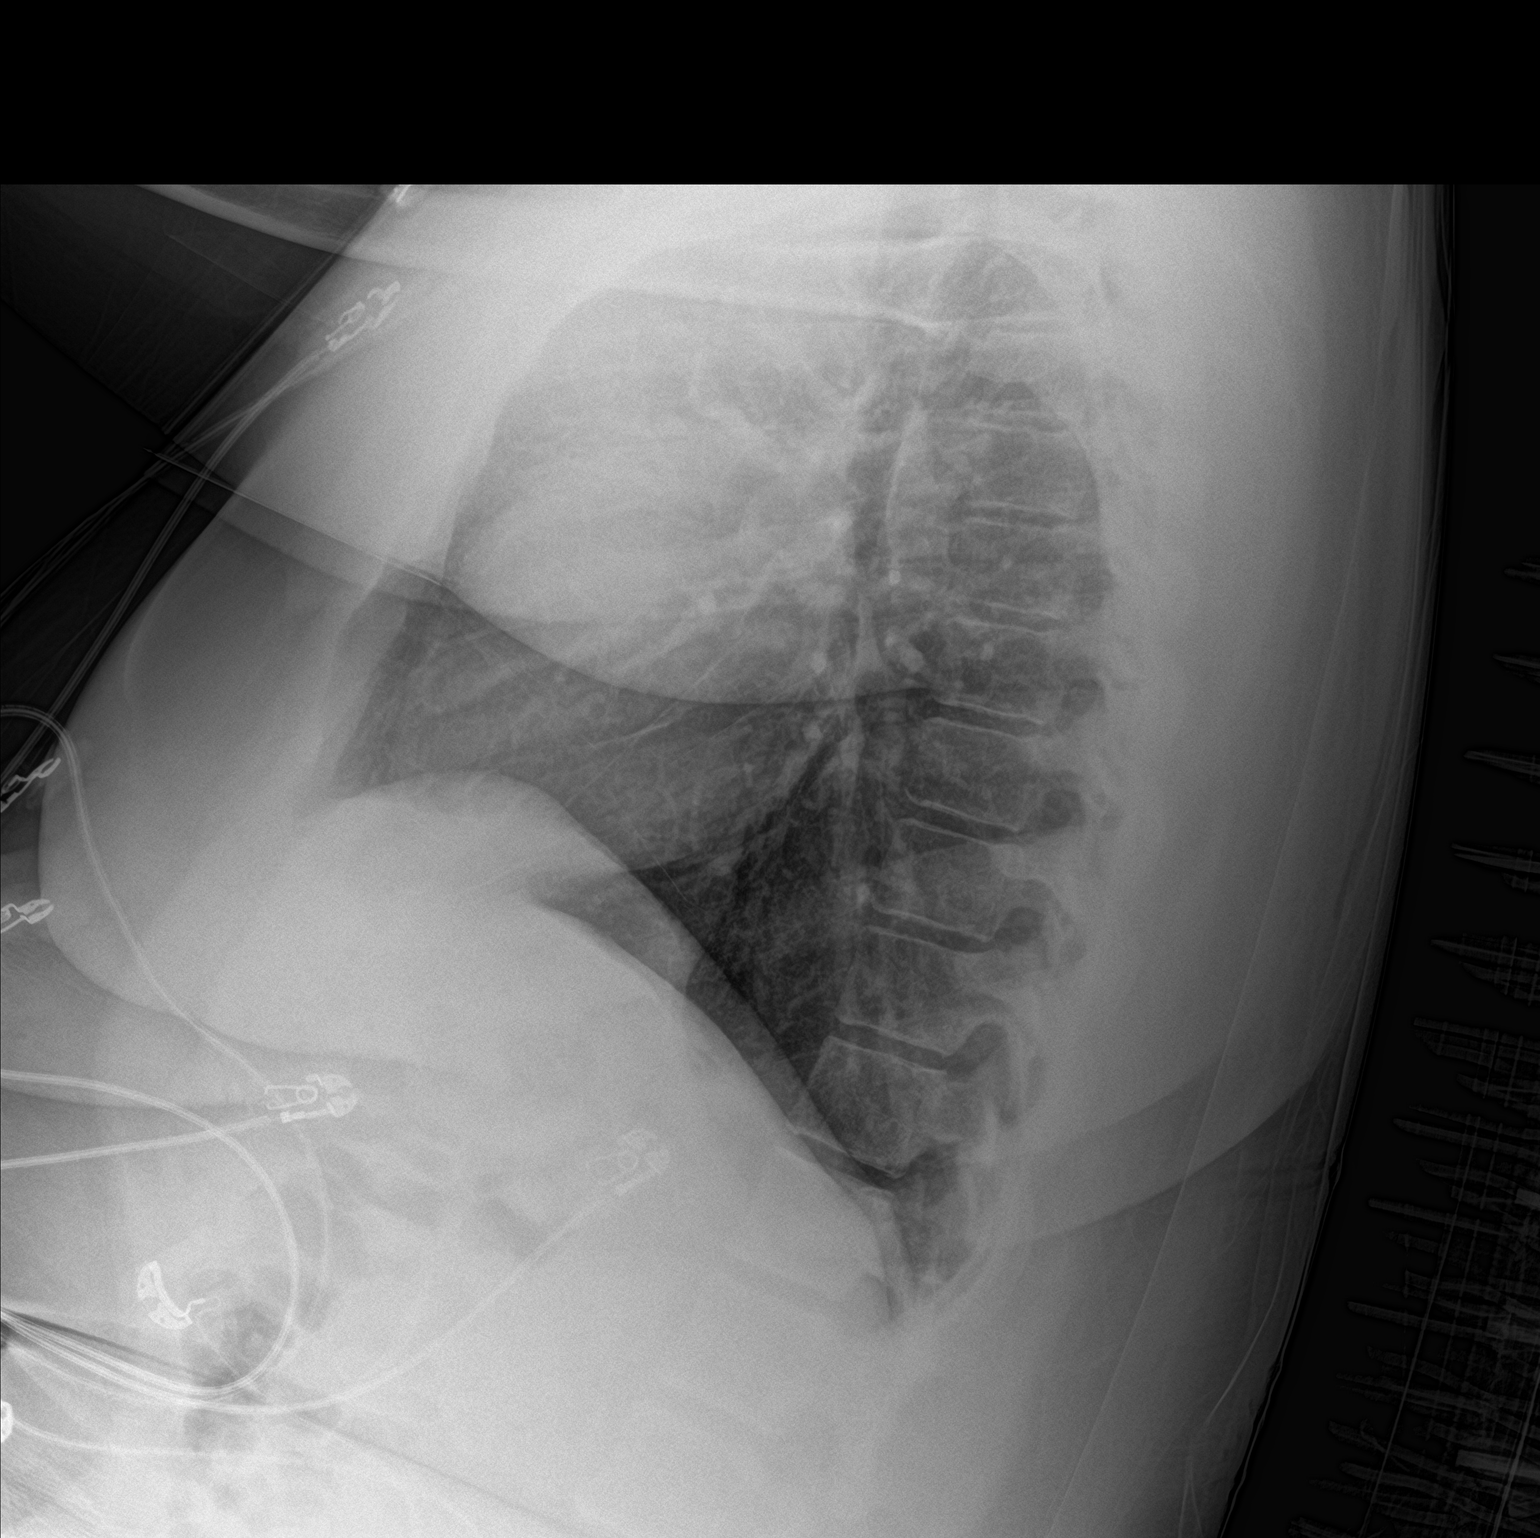

[chest ap]
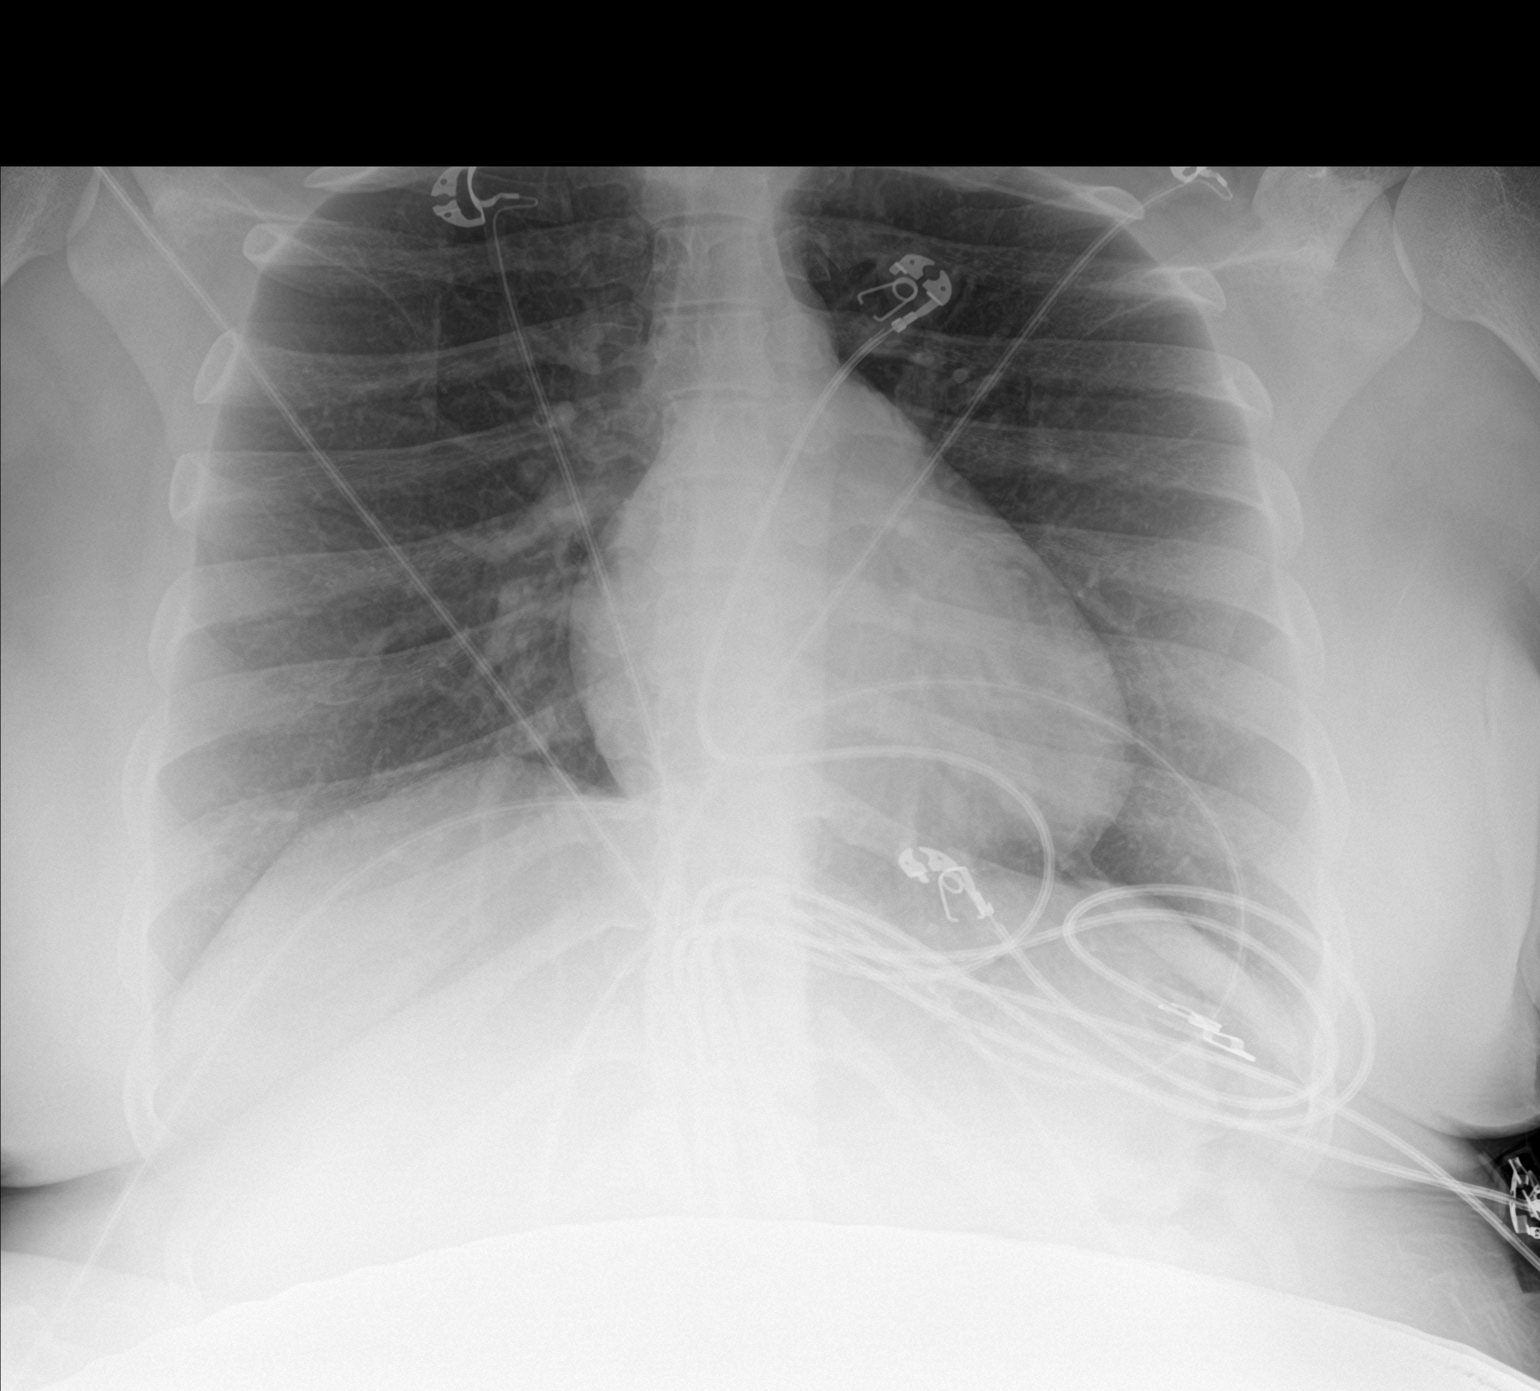

[2 of 2 positions shown; findings below may reference images not displayed]

FINDINGS: The heart size and mediastinal contours are within normal limits.
Both lungs are clear. The visualized skeletal structures are
unremarkable.
IMPRESSION: No active cardiopulmonary disease.

## 2020-04-23 ENCOUNTER — Encounter: Payer: Self-pay | Admitting: Family Medicine

## 2020-04-23 ENCOUNTER — Ambulatory Visit (INDEPENDENT_AMBULATORY_CARE_PROVIDER_SITE_OTHER): Payer: PRIVATE HEALTH INSURANCE

## 2020-04-23 ENCOUNTER — Other Ambulatory Visit: Payer: Self-pay

## 2020-04-23 ENCOUNTER — Ambulatory Visit (INDEPENDENT_AMBULATORY_CARE_PROVIDER_SITE_OTHER): Payer: PRIVATE HEALTH INSURANCE | Admitting: Family Medicine

## 2020-04-23 VITALS — BP 110/74 | HR 66 | Ht 66.0 in | Wt 235.0 lb

## 2020-04-23 DIAGNOSIS — M79671 Pain in right foot: Secondary | ICD-10-CM | POA: Diagnosis not present

## 2020-04-23 DIAGNOSIS — R600 Localized edema: Secondary | ICD-10-CM | POA: Diagnosis not present

## 2020-04-23 NOTE — Patient Instructions (Signed)
Thank you for coming in today.  Use the post op shoe as needed.  When able ok to switch to a more normal shoe.  A tennis shoe or sneaker will be more comfortable than dress shoes.   Buddy tape the toe with barefoot for a few weeks.   Ok to also use voltaren gel over the counter on this.   Recheck with me as needed.  OK to send me a message if needed.

## 2020-04-23 NOTE — Progress Notes (Signed)
    Subjective:    CC: R 5th toe pain  I, Hannah Ford, LAT, ATC, am serving as scribe for Dr. Clementeen Graham.  HPI: Pt is a 24 y/o female presenting w/ c/o R 5th toe pain since 2 Fridays ago when she stubbed her toe.  She was on vacation when this happened and notes that initially it was numb but since has been bruised and throbbing.  She walked around with this injury at [facility] and had worsening pain as result.  Radiating pain: slightly along her R lateral foot Toe/foot swelling: not currently Aggravating factors: walking/weight bearing activity; R 5th toe AROM Treatments tried: ice; IBU  Pertinent review of Systems: No fevers or chills  Relevant historical information: History of polyarthralgia and chronic cough   Objective:    Vitals:   04/23/20 1236  BP: 110/74  Pulse: 66  SpO2: 100%   General: Well Developed, well nourished, and in no acute distress.   MSK: Right foot normal-appearing not particularly swollen or bruised at cystoscopy. Mildly tender palpation fifth toe. Pulses capillary refill and sensation are intact distally.  Lab and Radiology Results X-ray images right foot obtained today personally and independently interpreted. No acute fractures visible. Await formal radiology review   Impression and Recommendations:    Assessment and Plan: 24 y.o. female with right fifth toe pain.  Patient effectively jammed or contused her right fifth toe 2 weeks ago.  She was unable to rest it and did a lot of walking at [employer] which is why I think she still hurts.  Plan for postop shoe as needed with transition back to normal shoe as tolerated.  Also recommend Voltaren gel and buddy tape.  Recheck if not improving.Marland Kitchen  PDMP not reviewed this encounter. Orders Placed This Encounter  Procedures  . DG Foot Complete Right    Standing Status:   Future    Number of Occurrences:   1    Standing Expiration Date:   05/23/2020    Order Specific Question:   Reason  for Exam (SYMPTOM  OR DIAGNOSIS REQUIRED)    Answer:   R foot pain    Order Specific Question:   Is patient pregnant?    Answer:   No    Order Specific Question:   Preferred imaging location?    Answer:   [facility]   No orders of the defined types were placed in this encounter.   Discussed warning signs or symptoms. Please see discharge instructions. Patient expresses understanding.   The above documentation has been reviewed and is accurate and complete Clementeen Graham, M.D.

## 2020-04-24 NOTE — Progress Notes (Signed)
No fracture seen into the foot.

## 2020-08-09 ENCOUNTER — Encounter: Payer: Self-pay | Admitting: Nurse Practitioner

## 2020-08-09 ENCOUNTER — Other Ambulatory Visit: Payer: Self-pay

## 2020-08-09 ENCOUNTER — Ambulatory Visit: Payer: 59 | Admitting: Nurse Practitioner

## 2020-08-09 VITALS — BP 112/80 | HR 61 | Temp 98.1°F | Ht 67.0 in | Wt 235.2 lb

## 2020-08-09 DIAGNOSIS — Z13228 Encounter for screening for other metabolic disorders: Secondary | ICD-10-CM | POA: Diagnosis not present

## 2020-08-09 DIAGNOSIS — G43009 Migraine without aura, not intractable, without status migrainosus: Secondary | ICD-10-CM

## 2020-08-09 DIAGNOSIS — Z0000 Encounter for general adult medical examination without abnormal findings: Secondary | ICD-10-CM

## 2020-08-09 DIAGNOSIS — Z1159 Encounter for screening for other viral diseases: Secondary | ICD-10-CM

## 2020-08-09 DIAGNOSIS — Z7689 Persons encountering health services in other specified circumstances: Secondary | ICD-10-CM | POA: Diagnosis not present

## 2020-08-09 NOTE — Patient Instructions (Addendum)
Health Maintenance, Female Adopting a healthy lifestyle and getting preventive care are important in promoting health and wellness. Ask your health care provider about:  The right schedule for you to have regular tests and exams.  Things you can do on your own to prevent diseases and keep yourself healthy. What should I know about diet, weight, and exercise? Eat a healthy diet  Eat a diet that includes plenty of vegetables, fruits, low-fat dairy products, and lean protein.  Do not eat a lot of foods that are high in solid fats, added sugars, or sodium.   Maintain a healthy weight Body mass index (BMI) is used to identify weight problems. It estimates body fat based on height and weight. Your health care provider can help determine your BMI and help you achieve or maintain a healthy weight. Get regular exercise Get regular exercise. This is one of the most important things you can do for your health. Most adults should:  Exercise for at least 150 minutes each week. The exercise should increase your heart rate and make you sweat (moderate-intensity exercise).  Do strengthening exercises at least twice a week. This is in addition to the moderate-intensity exercise.  Spend less time sitting. Even light physical activity can be beneficial. Watch cholesterol and blood lipids Have your blood tested for lipids and cholesterol at 24 years of age, then have this test every 5 years. Have your cholesterol levels checked more often if:  Your lipid or cholesterol levels are high.  You are older than 24 years of age.  You are at high risk for heart disease. What should I know about cancer screening? Depending on your health history and family history, you may need to have cancer screening at various ages. This may include screening for:  Breast cancer.  Cervical cancer.  Colorectal cancer.  Skin cancer.  Lung cancer. What should I know about heart disease, diabetes, and high blood  pressure? Blood pressure and heart disease  High blood pressure causes heart disease and increases the risk of stroke. This is more likely to develop in people who have high blood pressure readings, are of African descent, or are overweight.  Have your blood pressure checked: ? Every 3-5 years if you are 18-39 years of age. ? Every year if you are 40 years old or older. Diabetes Have regular diabetes screenings. This checks your fasting blood sugar level. Have the screening done:  Once every three years after age 40 if you are at a normal weight and have a low risk for diabetes.  More often and at a younger age if you are overweight or have a high risk for diabetes. What should I know about preventing infection? Hepatitis B If you have a higher risk for hepatitis B, you should be screened for this virus. Talk with your health care provider to find out if you are at risk for hepatitis B infection. Hepatitis C Testing is recommended for:  Everyone born from 1945 through 1965.  Anyone with known risk factors for hepatitis C. Sexually transmitted infections (STIs)  Get screened for STIs, including gonorrhea and chlamydia, if: ? You are sexually active and are younger than 24 years of age. ? You are older than 24 years of age and your health care provider tells you that you are at risk for this type of infection. ? Your sexual activity has changed since you were last screened, and you are at increased risk for chlamydia or gonorrhea. Ask your health care provider   if you are at risk.  Ask your health care provider about whether you are at high risk for HIV. Your health care provider may recommend a prescription medicine to help prevent HIV infection. If you choose to take medicine to prevent HIV, you should first get tested for HIV. You should then be tested every 3 months for as long as you are taking the medicine. Pregnancy  If you are about to stop having your period (premenopausal) and  you may become pregnant, seek counseling before you get pregnant.  Take 400 to 800 micrograms (mcg) of folic acid every day if you become pregnant.  Ask for birth control (contraception) if you want to prevent pregnancy. Osteoporosis and menopause Osteoporosis is a disease in which the bones lose minerals and strength with aging. This can result in bone fractures. If you are 105 years old or older, or if you are at risk for osteoporosis and fractures, ask your health care provider if you should:  Be screened for bone loss.  Take a calcium or vitamin D supplement to lower your risk of fractures.  Be given hormone replacement therapy (HRT) to treat symptoms of menopause. Follow these instructions at home: Lifestyle  Do not use any products that contain nicotine or tobacco, such as cigarettes, e-cigarettes, and chewing tobacco. If you need help quitting, ask your health care provider.  Do not use street drugs.  Do not share needles.  Ask your health care provider for help if you need support or information about quitting drugs. Alcohol use  Do not drink alcohol if: ? Your health care provider tells you not to drink. ? You are pregnant, may be pregnant, or are planning to become pregnant.  If you drink alcohol: ? Limit how much you use to 0-1 drink a day. ? Limit intake if you are breastfeeding.  Be aware of how much alcohol is in your drink. In the [place] one drink equals one 12 oz bottle of beer (355 mL), one 5 oz glass of wine (148 mL), or one 1 oz glass of hard liquor (44 mL). General instructions  Schedule regular health, dental, and eye exams.  Stay current with your vaccines.  Tell your health care provider if: ? You often feel depressed. ? You have ever been abused or do not feel safe at home. Summary  Adopting a healthy lifestyle and getting preventive care are important in promoting health and wellness.  Follow your health care provider's instructions about healthy  diet, exercising, and getting tested or screened for diseases.  Follow your health care provider's instructions on monitoring your cholesterol and blood pressure. This information is not intended to replace advice given to you by your health care provider. Make sure you discuss any questions you have with your health care provider. Document Revised: 05/26/2018 Document Reviewed: 05/26/2018 Elsevier Patient Education  2021 [employer] Inc.    Migraine Headache A migraine headache is a very strong throbbing pain on one side or both sides of your head. This type of headache can also cause other symptoms. It can last from 4 hours to 3 days. Talk with your doctor about what things may bring on (trigger) this condition. What are the causes? The exact cause of this condition is not known. This condition may be triggered or caused by:  Drinking alcohol.  Smoking.  Taking medicines, such as: ? Medicine used to treat chest pain (nitroglycerin). ? Birth control pills. ? Estrogen. ? Some blood pressure medicines.  Eating or drinking  certain products.  Doing physical activity. Other things that may trigger a migraine headache include:  Having a menstrual period.  Pregnancy.  Hunger.  Stress.  Not getting enough sleep or getting too much sleep.  Weather changes.  Tiredness (fatigue). What increases the risk?  Being 57-21 years old.  Being female.  Having a family history of migraine headaches.  Being Caucasian.  Having depression or anxiety.  Being very overweight. What are the signs or symptoms?  A throbbing pain. This pain may: ? Happen in any area of the head, such as on one side or both sides. ? Make it hard to do daily activities. ? Get worse with physical activity. ? Get worse around bright lights or loud noises.  Other symptoms may include: ? Feeling sick to your stomach (nauseous). ? Vomiting. ? Dizziness. ? Being sensitive to bright lights, loud noises, or  smells.  Before you get a migraine headache, you may get warning signs (an aura). An aura may include: ? Seeing flashing lights or having blind spots. ? Seeing bright spots, halos, or zigzag lines. ? Having tunnel vision or blurred vision. ? Having numbness or a tingling feeling. ? Having trouble talking. ? Having weak muscles.  Some people have symptoms after a migraine headache (postdromal phase), such as: ? Tiredness. ? Trouble thinking (concentrating). How is this treated?  Taking medicines that: ? Relieve pain. ? Relieve the feeling of being sick to your stomach. ? Prevent migraine headaches.  Treatment may also include: ? Having acupuncture. ? Avoiding foods that bring on migraine headaches. ? Learning ways to control your body functions (biofeedback). ? Therapy to help you know and deal with negative thoughts (cognitive behavioral therapy). Follow these instructions at home: Medicines  Take over-the-counter and prescription medicines only as told by your doctor.  Ask your doctor if the medicine prescribed to you: ? Requires you to avoid driving or using heavy machinery. ? Can cause trouble pooping (constipation). You may need to take these steps to prevent or treat trouble pooping:  Drink enough fluid to keep your pee (urine) pale yellow.  Take over-the-counter or prescription medicines.  Eat foods that are high in fiber. These include beans, whole grains, and fresh fruits and vegetables.  Limit foods that are high in fat and sugar. These include fried or sweet foods. Lifestyle  Do not drink alcohol.  Do not use any products that contain nicotine or tobacco, such as cigarettes, e-cigarettes, and chewing tobacco. If you need help quitting, ask your doctor.  Get at least 8 hours of sleep every night.  Limit and deal with stress. General instructions  Keep a journal to find out what may bring on your migraine headaches. For example, write down: ? What you eat  and drink. ? How much sleep you get. ? Any change in what you eat or drink. ? Any change in your medicines.  If you have a migraine headache: ? Avoid things that make your symptoms worse, such as bright lights. ? It may help to lie down in a dark, quiet room. ? Do not drive or use heavy machinery. ? Ask your doctor what activities are safe for you.  Keep all follow-up visits as told by your doctor. This is important.      Contact a doctor if:  You get a migraine headache that is different or worse than others you have had.  You have more than 15 headache days in one month. Get help right away if:  Your migraine headache gets very bad.  Your migraine headache lasts longer than 72 hours.  You have a fever.  You have a stiff neck.  You have trouble seeing.  Your muscles feel weak or like you cannot control them.  You start to lose your balance a lot.  You start to have trouble walking.  You pass out (faint).  You have a seizure. Summary  A migraine headache is a very strong throbbing pain on one side or both sides of your head. These headaches can also cause other symptoms.  This condition may be treated with medicines and changes to your lifestyle.  Keep a journal to find out what may bring on your migraine headaches.  Contact a doctor if you get a migraine headache that is different or worse than others you have had.  Contact your doctor if you have more than 15 headache days in a month. This information is not intended to replace advice given to you by your health care provider. Make sure you discuss any questions you have with your health care provider. Document Revised: 09/24/2018 Document Reviewed: 07/15/2018 Elsevier Patient Education  2021 [employer].

## 2020-08-09 NOTE — Progress Notes (Signed)
I,Yamilka Roman [employer] as a Education administrator for [employer], FNP.,have documented all relevant documentation on the behalf of Minette Brine, FNP,as directed by  Minette Brine, FNP while in the presence of Minette Brine, [place]. This visit occurred during the SARS-CoV-2 public health emergency.  Safety protocols were in place, including screening questions prior to the visit, additional usage of staff PPE, and extensive cleaning of exam room while observing appropriate contact time as indicated for disinfecting solutions.  Subjective:     Patient ID: Hannah Ford , female    DOB: [birth year] , 25 y.o.   MRN: [id number]   Chief Complaint  Patient presents with  . Establish Care  . Annual Exam    HPI  Patient presents today to establish primary care. Patient would like to have a physical done.  She was seeing Grier Rocher for her primary care. She goes to Dr Garwin Brothers for her GYN care.  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  She is taking her birth control 3 weeks then 1 week off. She is to supposed to take continuously. She does not have any current concerns or issues. She does report a history of migraines but have been under good control  Single, no children.          Past Medical History:  Diagnosis Date  . Abnormal menstrual periods   . Allergy   . Endometriosis   . Frequent headaches    otc med prn  . GERD (gastroesophageal reflux disease)   . Urticaria      Family History  Problem Relation Age of Onset  . Healthy Mother   . Hyperlipidemia Maternal Aunt        x 2  . Food Allergy Maternal Aunt   . Heart disease Maternal Grandmother   . Hypertension Maternal Grandmother   . Hypertension Maternal Grandfather   . Colon cancer Neg Hx   . Esophageal cancer Neg Hx   . Stomach cancer Neg Hx   . Pancreatic cancer Neg Hx   . Liver disease Neg Hx   . Inflammatory bowel disease Neg Hx      Current Outpatient Medications:  .  BIOTIN PO, Take 1 tablet by mouth daily.,  Disp: , Rfl:  .  Fluticasone-Salmeterol (ADVAIR DISKUS) 250-50 MCG/DOSE AEPB, Inhale 1 puff into the lungs 2 (two) times daily., Disp: 1 each, Rfl: 3 .  Multiple Vitamin (MULTIVITAMIN WITH MINERALS) TABS tablet, Take 1 tablet by mouth daily., Disp: , Rfl:  .  TRI-LEGEST FE 1-20/1-30/1-35 MG-MCG tablet, Take 1 tablet by mouth daily., Disp: , Rfl: 3   Allergies  Allergen Reactions  . Latex Other (See Comments)    unknown Other reaction(s): Other (See Comments) unknown   . Zithromax [Azithromycin] Other (See Comments)    Face redness and swelling  . Penicillins Rash    Has patient had a PCN reaction causing immediate rash, facial/tongue/throat swelling, SOB or lightheadedness with hypotension: Yes Has patient had a PCN reaction causing severe rash involving mucus membranes or skin necrosis: Yes Has patient had a PCN reaction that required hospitalization No Has patient had a PCN reaction occurring within the last 10 years: No If all of the above answers are "NO", then may proceed with Cephalosporin use.       The patient states she uses OCP (estrogen/progesterone) for birth control.  Patient's last menstrual period was 06/30/2020 (approximate).. Negative for Dysmenorrhea and Negative for Menorrhagia. Negative for: breast discharge, breast lump(s), breast pain  and breast self exam. Associated symptoms include abnormal vaginal bleeding. Pertinent negatives include abnormal bleeding (hematology), anxiety, decreased libido, depression, difficulty falling sleep, dyspareunia, history of infertility, nocturia, sexual dysfunction, sleep disturbances, urinary incontinence, urinary urgency, vaginal discharge and vaginal itching. Diet she is now pescatarian. She is unable to eat red meats due to digestion. The patient states her exercise level is 2-3 days a week, she does work out with a Physiological scientist.     The patient's tobacco use is:  Social History   Tobacco Use  Smoking Status Never Smoker   Smokeless Tobacco Never Used   She has been exposed to passive smoke. The patient's alcohol use is:  Social History   Substance and Sexual Activity  Alcohol Use Yes   Additional information: Last pap - will obtain records from Dr. Garwin Brothers   Review of Systems  Constitutional: Negative.   HENT: Negative.   Eyes: Negative.   Respiratory: Negative.   Cardiovascular: Negative.   Gastrointestinal: Negative.   Endocrine: Negative.   Genitourinary: Negative.   Musculoskeletal: Negative.   Skin: Negative.   Allergic/Immunologic: Negative.   Neurological: Negative.   Hematological: Negative.   Psychiatric/Behavioral: Negative.      Today's Vitals   08/09/20 1533  BP: 112/80  Pulse: 61  Temp: 98.1 F (36.7 C)  TempSrc: Oral  Weight: 235 lb 3.2 oz (106.7 kg)  Height: '5\' 7"'  (1.702 m)  PainSc: 0-No pain   Body mass index is 36.84 kg/m.   Objective:  Physical Exam Constitutional:      General: She is not in acute distress.    Appearance: Normal appearance. She is well-developed. She is obese.  HENT:     Head: Normocephalic and atraumatic.     Right Ear: Hearing, tympanic membrane, ear canal and external ear normal. There is no impacted cerumen.     Left Ear: Hearing, tympanic membrane, ear canal and external ear normal. There is no impacted cerumen.     Nose:     Comments: Deferred - masked    Mouth/Throat:     Comments: Deferred - masked Eyes:     General: Lids are normal.     Extraocular Movements: Extraocular movements intact.     Conjunctiva/sclera: Conjunctivae normal.     Pupils: Pupils are equal, round, and reactive to light.     Funduscopic exam:    Right eye: No papilledema.        Left eye: No papilledema.  Neck:     Thyroid: No thyroid mass.     Vascular: No carotid bruit.  Cardiovascular:     Rate and Rhythm: Normal rate and regular rhythm.     Pulses: Normal pulses.     Heart sounds: Normal heart sounds. No murmur heard.   Pulmonary:     Effort:  Pulmonary effort is normal.     Breath sounds: Normal breath sounds.  Chest:     Chest wall: No mass.  Breasts:     Tanner Score is 5.     Right: Normal. No mass, tenderness, axillary adenopathy or supraclavicular adenopathy.     Left: Normal. No mass, tenderness, axillary adenopathy or supraclavicular adenopathy.    Abdominal:     General: Abdomen is flat. Bowel sounds are normal. There is no distension.     Palpations: Abdomen is soft.     Tenderness: There is no abdominal tenderness.  Genitourinary:    Rectum: Guaiac result negative.  Musculoskeletal:        General:  No swelling. Normal range of motion.     Cervical back: Full passive range of motion without pain, normal range of motion and neck supple.     Right lower leg: No edema.     Left lower leg: No edema.  Lymphadenopathy:     Upper Body:     Right upper body: No supraclavicular, axillary or pectoral adenopathy.     Left upper body: No supraclavicular, axillary or pectoral adenopathy.  Skin:    General: Skin is warm and dry.     Capillary Refill: Capillary refill takes less than 2 seconds.  Neurological:     General: No focal deficit present.     Mental Status: She is alert and oriented to person, place, and time.     Cranial Nerves: No cranial nerve deficit.     Sensory: No sensory deficit.  Psychiatric:        Mood and Affect: Mood normal.        Behavior: Behavior normal.        Thought Content: Thought content normal.        Judgment: Judgment normal.         Assessment And Plan:     1. Encounter for general adult medical examination w/o abnormal findings . Behavior modifications discussed and diet history reviewed.   . Pt will continue to exercise regularly and modify diet with low GI, plant based foods and decrease intake of processed foods.  . Recommend intake of daily multivitamin, Vitamin D, and calcium.  . Recommend for preventive screenings, as well as recommend immunizations that include  influenza, TDAP (will obtain records from previous provider and Dr. Garwin Brothers to check for any immunizations) - CMP14+EGFR - CBC - Hemoglobin A1c - VITAMIN D 25 Hydroxy (Vit-D Deficiency, Fractures) - Lipid panel  2. Encounter for hepatitis C screening test for low risk patient  Will check Hepatitis C screening due to recent recommendations to screen all adults 18 years and older - Hepatitis C antibody  3. Encounter for screening for metabolic disorder  4. Migraine without aura and without status migrainosus, not intractable  Chronic, no recent episodes and are well controlled  5. Encounter to establish care with new doctor     Patient was given opportunity to ask questions. Patient verbalized understanding of the plan and was able to repeat key elements of the plan. All questions were answered to their satisfaction.   Minette Brine, FNP    I, Minette Brine, FNP, have reviewed all documentation for this visit. The documentation on 08/09/20 for the exam, diagnosis, procedures, and orders are all accurate and complete.  THE PATIENT IS ENCOURAGED TO PRACTICE SOCIAL DISTANCING DUE TO THE COVID-19 PANDEMIC.

## 2020-08-10 LAB — CBC
Hematocrit: 37.4 % (ref 34.0–46.6)
Hemoglobin: 12.5 g/dL (ref 11.1–15.9)
MCH: 27.8 pg (ref 26.6–33.0)
MCHC: 33.4 g/dL (ref 31.5–35.7)
MCV: 83 fL (ref 79–97)
Platelets: 203 10*3/uL (ref 150–450)
RBC: 4.49 x10E6/uL (ref 3.77–5.28)
RDW: 12.2 % (ref 11.7–15.4)
WBC: 6.7 10*3/uL (ref 3.4–10.8)

## 2020-08-10 LAB — HEPATITIS C ANTIBODY: Hep C Virus Ab: <0.1 s/co ratio (ref 0.0–0.9)

## 2020-08-10 LAB — CMP14+EGFR
ALT: 27 IU/L (ref 0–32)
AST: 40 IU/L (ref 0–40)
Albumin/Globulin Ratio: 2 (ref 1.2–2.2)
Albumin: 4.7 g/dL (ref 3.9–5.0)
Alkaline Phosphatase: 83 IU/L (ref 44–121)
BUN/Creatinine Ratio: 14 (ref 9–23)
BUN: 13 mg/dL (ref 6–20)
Bilirubin Total: 0.2 mg/dL (ref 0.0–1.2)
CO2: 19 mmol/L — ABNORMAL LOW (ref 20–29)
Calcium: 9.7 mg/dL (ref 8.7–10.2)
Chloride: 101 mmol/L (ref 96–106)
Creatinine, Ser: 0.94 mg/dL (ref 0.57–1.00)
GFR calc Af Amer: 99 mL/min/{1.73_m2} (ref >59)
GFR calc non Af Amer: 86 mL/min/{1.73_m2} (ref >59)
Globulin, Total: 2.3 g/dL (ref 1.5–4.5)
Glucose: 79 mg/dL (ref 65–99)
Potassium: 4.6 mmol/L (ref 3.5–5.2)
Sodium: 141 mmol/L (ref 134–144)
Total Protein: 7 g/dL (ref 6.0–8.5)

## 2020-08-10 LAB — HEMOGLOBIN A1C
Est. average glucose Bld gHb Est-mCnc: 103 mg/dL
Hgb A1c MFr Bld: 5.2 % (ref 4.8–5.6)

## 2020-08-10 LAB — LIPID PANEL
Chol/HDL Ratio: 2.9 ratio (ref 0.0–4.4)
Cholesterol, Total: 233 mg/dL — ABNORMAL HIGH (ref 100–199)
HDL: 81 mg/dL (ref >39)
LDL Chol Calc (NIH): 137 mg/dL — ABNORMAL HIGH (ref 0–99)
Triglycerides: 88 mg/dL (ref 0–149)
VLDL Cholesterol Cal: 15 mg/dL (ref 5–40)

## 2020-08-10 LAB — VITAMIN D 25 HYDROXY (VIT D DEFICIENCY, FRACTURES): Vit D, 25-Hydroxy: 28 ng/mL — ABNORMAL LOW (ref 30.0–100.0)

## 2020-08-27 NOTE — Telephone Encounter (Signed)
Schedule a follow up cholesterol in 8 weeks

## 2020-09-06 ENCOUNTER — Encounter: Payer: Self-pay | Admitting: Nurse Practitioner

## 2020-09-06 DIAGNOSIS — M438X2 Other specified deforming dorsopathies, cervical region: Secondary | ICD-10-CM | POA: Diagnosis not present

## 2020-09-06 DIAGNOSIS — M47819 Spondylosis without myelopathy or radiculopathy, site unspecified: Secondary | ICD-10-CM | POA: Diagnosis not present

## 2020-09-06 DIAGNOSIS — M25511 Pain in right shoulder: Secondary | ICD-10-CM | POA: Diagnosis not present

## 2020-12-27 ENCOUNTER — Encounter: Payer: Self-pay | Admitting: Nurse Practitioner

## 2021-01-08 ENCOUNTER — Ambulatory Visit: Payer: 59 | Admitting: Nurse Practitioner

## 2021-01-10 ENCOUNTER — Ambulatory Visit: Payer: 59 | Admitting: Nurse Practitioner

## 2021-01-11 ENCOUNTER — Telehealth: Payer: Self-pay

## 2021-01-11 NOTE — Telephone Encounter (Signed)
Patient cancelled appt with note that she changed providers

## 2021-01-27 DIAGNOSIS — S81801A Unspecified open wound, right lower leg, initial encounter: Secondary | ICD-10-CM | POA: Insufficient documentation

## 2021-01-27 DIAGNOSIS — W268XXA Contact with other sharp object(s), not elsewhere classified, initial encounter: Secondary | ICD-10-CM | POA: Diagnosis not present

## 2021-01-29 DIAGNOSIS — W268XXA Contact with other sharp object(s), not elsewhere classified, initial encounter: Secondary | ICD-10-CM | POA: Insufficient documentation

## 2021-01-30 ENCOUNTER — Other Ambulatory Visit: Payer: Self-pay

## 2021-01-30 ENCOUNTER — Encounter: Payer: Self-pay | Admitting: Emergency Medicine

## 2021-01-30 ENCOUNTER — Ambulatory Visit
Admission: EM | Admit: 2021-01-30 | Discharge: 2021-01-30 | Disposition: A | Discharge status: Home / Self Care | Payer: 59

## 2021-01-30 DIAGNOSIS — S71111A Laceration without foreign body, right thigh, initial encounter: Secondary | ICD-10-CM | POA: Diagnosis not present

## 2021-01-30 DIAGNOSIS — Z5189 Encounter for other specified aftercare: Secondary | ICD-10-CM

## 2021-01-30 NOTE — ED Triage Notes (Signed)
Pt here for wound check after having sutures placed in right upper leg; pt sts some swelling and green colored discharge; pt sts taking cephalexin currently

## 2021-01-30 NOTE — Discharge Instructions (Addendum)
Please continue cephalexin antibiotic.  Follow-up if wound appearance worsens.

## 2021-01-30 NOTE — ED Provider Notes (Signed)
[facility]    CSN: [id number] Arrival date & time: 01/30/21  1812      History   Chief Complaint Chief Complaint  Patient presents with   Wound Check    HPI Hannah Ford is a 25 y.o. female.   Patient presents for evaluation of laceration that has sutures present to right thigh.  Patient states that she fell onto a beach chair a few days prior and had a laceration to thigh.  Was seen by provider at beach, and was given 7 stitches to outside and 2 stitches internal.  Patient presents today because she is concerned that wound could be infected.  Is currently taking cephalexin that was prescribed at original visit for sutures.  Tetanus was also given to patient.  Patient is concerned for infection due to small amount of yellow drainage on dressings during dressing changes.   Wound Check   Past Medical History:  Diagnosis Date   Abnormal menstrual periods    Allergy    Endometriosis    Frequent headaches    otc med prn   GERD (gastroesophageal reflux disease)    Urticaria     Patient Active Problem List   Diagnosis Date Noted   Chronic cough 06/12/2019   Acute left ankle pain 01/18/2018   Upper respiratory infection 10/20/2017   Tonsil stone 09/24/2016   Hematuria 05/21/2016   Back pain 05/21/2016   Polyarthralgia 11/19/2015   Chest pain 11/19/2015   GERD (gastroesophageal reflux disease) 11/19/2015   Arthralgia 11/14/2015   Menorrhagia 02/20/2015    Past Surgical History:  Procedure Laterality Date   LAPAROSCOPY N/A 02/14/2016   Procedure: LAPAROSCOPY DIAGNOSTIC FULGERATION OF PELVIC ENDOMETRIOSIS;  Surgeon: Maxie Better, MD;  Location: [facility];  Service: Gynecology;  Laterality: N/A;  @ 2hrs.   WISDOM TOOTH EXTRACTION      OB History   No obstetric history on file.      Home Medications    Prior to Admission medications   Medication Sig Start Date End Date Taking? Authorizing Provider  cephALEXin (KEFLEX) 500 MG capsule Take 500 mg by  mouth 3 (three) times daily.   Yes [provider]  BIOTIN PO Take 1 tablet by mouth daily.    [provider]  Fluticasone-Salmeterol (ADVAIR DISKUS) 250-50 MCG/DOSE AEPB Inhale 1 puff into the lungs 2 (two) times daily. 06/09/19   Dianne Dun, MD  Multiple Vitamin (MULTIVITAMIN WITH MINERALS) TABS tablet Take 1 tablet by mouth daily.    [provider]  TRI-LEGEST FE 1-20/1-30/1-35 MG-MCG tablet Take 1 tablet by mouth daily. 08/30/17   [provider]    Family History Family History  Problem Relation Age of Onset   Healthy Mother    Hyperlipidemia Maternal Aunt        x 2   Food Allergy Maternal Aunt    Heart disease Maternal Grandmother    Hypertension Maternal Grandmother    Hypertension Maternal Grandfather    Colon cancer Neg Hx    Esophageal cancer Neg Hx    Stomach cancer Neg Hx    Pancreatic cancer Neg Hx    Liver disease Neg Hx    Inflammatory bowel disease Neg Hx     Social History Social History   Tobacco Use   Smoking status: Never   Smokeless tobacco: Never  Vaping Use   Vaping Use: Never used  Substance Use Topics   Alcohol use: Yes   Drug use: No  Allergies   Latex, Zithromax [azithromycin], and Penicillins   Review of Systems Review of Systems Per HPI  Physical Exam Triage Vital Signs ED Triage Vitals  Enc Vitals Group     BP 01/30/21 1845 (!) 141/73     Pulse Rate 01/30/21 1845 62     Resp 01/30/21 1845 18     Temp 01/30/21 1845 98.6 F (37 C)     Temp Source 01/30/21 1845 Oral     SpO2 01/30/21 1845 100 %     Weight --      Height --      Head Circumference --      Peak Flow --      Pain Score 01/30/21 1846 4     Pain Loc --      Pain Edu? --      Excl. in GC? --    No data found.  Updated Vital Signs BP (!) 141/73 (BP Location: Left Arm)   Pulse 62   Temp 98.6 F (37 C) (Oral)   Resp 18   SpO2 100%   Visual Acuity Right Eye Distance:   Left Eye Distance:   Bilateral Distance:     Right Eye Near:   Left Eye Near:    Bilateral Near:     Physical Exam Constitutional:      Appearance: Normal appearance.  HENT:     Head: Normocephalic and atraumatic.  Eyes:     Extraocular Movements: Extraocular movements intact.     Conjunctiva/sclera: Conjunctivae normal.  Pulmonary:     Effort: Pulmonary effort is normal.  Skin:    Findings: Laceration present.     Comments: Approximately 3 to 4 inch laceration present to right lateral thigh.  Also has abrasion that extends from laceration.  7 sutures are present to laceration.  Laceration is healing well, and there are no signs of infection.  Neurological:     General: No focal deficit present.     Mental Status: She is alert and oriented to person, place, and time. Mental status is at baseline.  Psychiatric:        Mood and Affect: Mood normal.        Behavior: Behavior normal.        Thought Content: Thought content normal.        Judgment: Judgment normal.     UC Treatments / Results  Labs (all labs ordered are listed, but only abnormal results are displayed) Labs Reviewed - No data to display  EKG   Radiology No results found.  Procedures Procedures (including critical care time)  Medications Ordered in UC Medications - No data to display  Initial Impression / Assessment and Plan / UC Course  I have reviewed the triage vital signs and the nursing notes.  Pertinent labs & imaging results that were available during my care of the patient were reviewed by me and considered in my medical decision making (see chart for details).     Laceration is healing well, and there are no signs of infection.  Patient to continue cephalexin antibiotic and monitor wound closely for infection.  Patient to follow-up if laceration appears to worsen in appearance.Discussed strict return precautions. Patient verbalized understanding and is agreeable with plan.  Final Clinical Impressions(s) / UC Diagnoses   Final  diagnoses:  Laceration of right thigh, initial encounter  Visit for wound check     Discharge Instructions      Please continue cephalexin antibiotic.  Follow-up if wound  appearance worsens.     ED Prescriptions   None    PDMP not reviewed this encounter.   Lance Muss, FNP 01/30/21 [phone]

## 2021-02-28 ENCOUNTER — Ambulatory Visit: Payer: 59 | Admitting: Nurse Practitioner

## 2021-04-08 ENCOUNTER — Telehealth: Payer: 59 | Admitting: Physician Assistant

## 2021-04-08 DIAGNOSIS — H66009 Acute suppurative otitis media without spontaneous rupture of ear drum, unspecified ear: Secondary | ICD-10-CM

## 2021-04-08 DIAGNOSIS — J019 Acute sinusitis, unspecified: Secondary | ICD-10-CM | POA: Diagnosis not present

## 2021-04-08 DIAGNOSIS — B9689 Other specified bacterial agents as the cause of diseases classified elsewhere: Secondary | ICD-10-CM | POA: Diagnosis not present

## 2021-04-08 MED ORDER — DOXYCYCLINE HYCLATE 100 MG PO TABS: 100.0000 mg | ORAL_TABLET | Freq: Two times a day (BID) | ORAL | 0 refills | Status: DC

## 2021-04-08 NOTE — Progress Notes (Signed)
E-Visit for Sinus Problems  We are sorry that you are not feeling well.  Here is how we plan to help!  Based on what you have shared with me it looks like you have sinusitis.  Sinusitis is inflammation and infection in the sinus cavities of the head.  Based on your presentation I believe you most likely have Acute Bacterial Sinusitis.  This is an infection caused by bacteria and is treated with antibiotics. I have prescribed Doxycycline 100mg  by mouth twice a day for 10 days. You may use an oral decongestant such as Mucinex D or if you have glaucoma or high blood pressure use plain Mucinex. Saline nasal spray help and can safely be used as often as needed for congestion.  If you develop worsening sinus pain, fever or notice severe headache and vision changes, or if symptoms are not better after completion of antibiotic, please schedule an appointment with a health care provider.    I do suspect you have an ear infection as well.   Sinus infections are not as easily transmitted as other respiratory infection, however we still recommend that you avoid close contact with loved ones, especially the very young and elderly.  Remember to wash your hands thoroughly throughout the day as this is the number one way to prevent the spread of infection!  Home Care: Only take medications as instructed by your medical team. Complete the entire course of an antibiotic. Do not take these medications with alcohol. A steam or ultrasonic humidifier can help congestion.  You can place a towel over your head and breathe in the steam from hot water coming from a faucet. Avoid close contacts especially the very young and the elderly. Cover your mouth when you cough or sneeze. Always remember to wash your hands.  Get Help Right Away If: You develop worsening fever or sinus pain. You develop a severe head ache or visual changes. Your symptoms persist after you have completed your treatment plan.  Make sure  you Understand these instructions. Will watch your condition. Will get help right away if you are not doing well or get worse.  Thank you for choosing an e-visit.  Your e-visit answers were reviewed by a board certified advanced clinical practitioner to complete your personal care plan. Depending upon the condition, your plan could have included both over the counter or prescription medications.  Please review your pharmacy choice. Make sure the pharmacy is open so you can pick up prescription now. If there is a problem, you may contact your provider through and have the prescription routed to another pharmacy.  Your safety is important to [employer]. If you have drug allergies check your prescription carefully.   For the next 24 hours you can use MyChart to ask questions about today's visit, request a non-urgent call back, or ask for a work or school excuse. You will get an email in the next two days asking about your experience. I hope that your e-visit has been valuable and will speed your recovery.  I provided 5 minutes of non face-to-face time during this encounter for chart review and documentation.

## 2021-05-03 ENCOUNTER — Ambulatory Visit (INDEPENDENT_AMBULATORY_CARE_PROVIDER_SITE_OTHER): Payer: 59

## 2021-05-03 ENCOUNTER — Other Ambulatory Visit: Payer: Self-pay

## 2021-05-03 DIAGNOSIS — Z1152 Encounter for screening for COVID-19: Secondary | ICD-10-CM

## 2021-05-03 DIAGNOSIS — Z112 Encounter for screening for other bacterial diseases: Secondary | ICD-10-CM | POA: Diagnosis not present

## 2021-05-03 LAB — POCT RAPID STREP A (OFFICE): Rapid Strep A Screen: NEGATIVE

## 2021-05-03 LAB — POC COVID19 BINAXNOW: SARS Coronavirus 2 Ag: NEGATIVE

## 2021-05-03 NOTE — Progress Notes (Signed)
Pt was given Rapid Strep test that is NEG  Pt was given Rapid COVID test which is NEG

## 2021-05-27 ENCOUNTER — Ambulatory Visit: Payer: 59 | Admitting: Internal Medicine

## 2021-05-27 ENCOUNTER — Encounter: Payer: Self-pay | Admitting: Internal Medicine

## 2021-05-27 ENCOUNTER — Other Ambulatory Visit: Payer: Self-pay

## 2021-05-27 VITALS — BP 132/68 | HR 92 | Temp 98.2°F | Ht 66.75 in | Wt 235.8 lb

## 2021-05-27 DIAGNOSIS — L309 Dermatitis, unspecified: Secondary | ICD-10-CM | POA: Diagnosis not present

## 2021-05-27 DIAGNOSIS — J019 Acute sinusitis, unspecified: Secondary | ICD-10-CM | POA: Insufficient documentation

## 2021-05-27 DIAGNOSIS — J0101 Acute recurrent maxillary sinusitis: Secondary | ICD-10-CM

## 2021-05-27 DIAGNOSIS — Z0000 Encounter for general adult medical examination without abnormal findings: Secondary | ICD-10-CM

## 2021-05-27 MED ORDER — CEFDINIR 300 MG PO CAPS: 300.0000 mg | ORAL_CAPSULE | Freq: Two times a day (BID) | ORAL | 0 refills | Status: DC

## 2021-05-27 MED ORDER — TRIAMCINOLONE ACETONIDE 0.1 % EX CREA: 1.0000 "application " | TOPICAL_CREAM | Freq: Three times a day (TID) | CUTANEOUS | 3 refills | Status: DC | PRN

## 2021-05-27 MED ORDER — LORATADINE 10 MG PO TABS: 10.0000 mg | ORAL_TABLET | Freq: Every day | ORAL | 3 refills | Status: DC

## 2021-05-27 NOTE — Progress Notes (Signed)
Subjective:  Patient ID: Hannah Ford, female    DOB: Oct 03, 1995  Age: 25 y.o. MRN: [id number]  CC: New Patient (Initial Visit)   HPI Hannah Ford presents for a new pt visit C/o sinus drainage -not better - R side: thick, bloody x 4+ weeks.  Complains of right cheek swelling at times, pain. C/o eczema in winter  GYN Dr Cherly Hensen  Outpatient Medications Prior to Visit  Medication Sig Dispense Refill   BIOTIN PO Take 1 tablet by mouth daily.     Multiple Vitamin (MULTIVITAMIN WITH MINERALS) TABS tablet Take 1 tablet by mouth daily.     Fluticasone-Salmeterol (ADVAIR DISKUS) 250-50 MCG/DOSE AEPB Inhale 1 puff into the lungs 2 (two) times daily. (Patient taking differently: Inhale 1 puff into the lungs 2 (two) times daily as needed.) 1 each 3   cephALEXin (KEFLEX) 500 MG capsule Take 500 mg by mouth 3 (three) times daily. (Patient not taking: Reported on 05/27/2021)     doxycycline (VIBRA-TABS) 100 MG tablet Take 1 tablet (100 mg total) by mouth 2 (two) times daily. (Patient not taking: Reported on 05/27/2021) 20 tablet 0   No facility-administered medications prior to visit.    ROS: Review of Systems  Constitutional:  Positive for fatigue. Negative for activity change, appetite change, chills, fever and unexpected weight change.  HENT:  Positive for congestion, rhinorrhea, sinus pressure and sinus pain. Negative for mouth sores.   Eyes:  Negative for visual disturbance.  Respiratory:  Negative for cough and chest tightness.   Gastrointestinal:  Negative for abdominal pain and nausea.  Genitourinary:  Negative for difficulty urinating, frequency and vaginal pain.  Musculoskeletal:  Negative for back pain and gait problem.  Skin:  Positive for rash. Negative for pallor.  Neurological:  Negative for dizziness, tremors, weakness, numbness and headaches.  Psychiatric/Behavioral:  Negative for confusion and sleep disturbance.    Objective:  BP 132/68 (BP Location: Left Arm)   Pulse 92    Temp 98.2 F (36.8 C) (Oral)   Ht 5' 6.75" (1.695 m)   Wt 235 lb 12.8 oz (107 kg)   SpO2 99%   BMI 37.21 kg/m   BP Readings from Last 3 Encounters:  05/27/21 132/68  01/30/21 (!) 141/73  08/09/20 112/80    Wt Readings from Last 3 Encounters:  05/27/21 235 lb 12.8 oz (107 kg)  08/09/20 235 lb 3.2 oz (106.7 kg)  04/23/20 235 lb (106.6 kg)    Physical Exam Constitutional:      General: She is not in acute distress.    Appearance: She is well-developed.  HENT:     Head: Normocephalic.     Right Ear: External ear normal.     Left Ear: External ear normal.     Nose: Nose normal.  Eyes:     General:        Right eye: No discharge.        Left eye: No discharge.     Conjunctiva/sclera: Conjunctivae normal.     Pupils: Pupils are equal, round, and reactive to light.  Neck:     Thyroid: No thyromegaly.     Vascular: No JVD.     Trachea: No tracheal deviation.  Cardiovascular:     Rate and Rhythm: Normal rate and regular rhythm.     Heart sounds: Normal heart sounds.  Pulmonary:     Effort: No respiratory distress.     Breath sounds: No stridor. No wheezing.  Abdominal:  General: Bowel sounds are normal. There is no distension.     Palpations: Abdomen is soft. There is no mass.     Tenderness: There is no abdominal tenderness. There is no guarding or rebound.  Musculoskeletal:        General: No tenderness.     Cervical back: Normal range of motion and neck supple. No rigidity.  Lymphadenopathy:     Cervical: No cervical adenopathy.  Skin:    Findings: No erythema or rash.  Neurological:     Cranial Nerves: No cranial nerve deficit.     Motor: No abnormal muscle tone.     Coordination: Coordination normal.     Deep Tendon Reflexes: Reflexes normal.  Psychiatric:        Behavior: Behavior normal.        Thought Content: Thought content normal.        Judgment: Judgment normal.  B nares w/swelling R>L  Lab Results  Component Value Date   WBC 5.2 05/30/2021    HGB 11.7 (L) 05/30/2021   HCT 36.3 05/30/2021   PLT 211.0 05/30/2021   GLUCOSE 86 05/30/2021   CHOL 193 05/30/2021   TRIG 72.0 05/30/2021   HDL 68.50 05/30/2021   LDLCALC 110 (H) 05/30/2021   ALT 15 05/30/2021   AST 16 05/30/2021   NA 141 05/30/2021   K 4.0 05/30/2021   CL 105 05/30/2021   CREATININE 0.87 05/30/2021   BUN 10 05/30/2021   CO2 28 05/30/2021   TSH 1.87 05/30/2021   HGBA1C 5.2 08/09/2020    No results found.  Assessment & Plan:   Problem List Items Addressed This Visit     Acute sinusitis    R side Refractory - try Omnicef x 2 weeks Loratidine  10 mg/d Use sinus rinse      Relevant Medications   cefdinir (OMNICEF) 300 MG capsule   loratadine (CLARITIN) 10 MG tablet   Eczema    Use Triamc cream prn Aquaphor moisturizer      Other Visit Diagnoses     Well adult exam    -  Primary   Relevant Orders   TSH (Completed)   Urinalysis   CBC with Differential/Platelet (Completed)   Lipid panel (Completed)   Comprehensive metabolic panel (Completed)         Meds ordered this encounter  Medications   cefdinir (OMNICEF) 300 MG capsule    Sig: Take 1 capsule (300 mg total) by mouth 2 (two) times daily.    Dispense:  28 capsule    Refill:  0   loratadine (CLARITIN) 10 MG tablet    Sig: Take 1 tablet (10 mg total) by mouth daily.    Dispense:  100 tablet    Refill:  3   triamcinolone cream (KENALOG) 0.1 %    Sig: Apply 1 application topically 3 (three) times daily as needed. Rash    Dispense:  80 g    Refill:  3      Follow-up: No follow-ups on file.  Walker Kehr, MD

## 2021-05-27 NOTE — Assessment & Plan Note (Addendum)
Use Triamc cream prn Aquaphor moisturizer

## 2021-05-27 NOTE — Assessment & Plan Note (Addendum)
R side Refractory - try Omnicef x 2 weeks Loratidine  10 mg/d Use sinus rinse

## 2021-05-30 ENCOUNTER — Other Ambulatory Visit (INDEPENDENT_AMBULATORY_CARE_PROVIDER_SITE_OTHER): Payer: 59

## 2021-05-30 DIAGNOSIS — Z0000 Encounter for general adult medical examination without abnormal findings: Secondary | ICD-10-CM

## 2021-05-30 LAB — LIPID PANEL
Cholesterol: 193 mg/dL (ref 0–200)
HDL: 68.5 mg/dL (ref >39.00)
LDL Cholesterol: 110 mg/dL — ABNORMAL HIGH (ref 0–99)
NonHDL: 124.03
Total CHOL/HDL Ratio: 3
Triglycerides: 72 mg/dL (ref 0.0–149.0)
VLDL: 14.4 mg/dL (ref 0.0–40.0)

## 2021-05-30 LAB — CBC WITH DIFFERENTIAL/PLATELET
Basophils Absolute: 0 10*3/uL (ref 0.0–0.1)
Basophils Relative: 0.4 % (ref 0.0–3.0)
Eosinophils Absolute: 0 10*3/uL (ref 0.0–0.7)
Eosinophils Relative: 0.5 % (ref 0.0–5.0)
HCT: 36.3 % (ref 36.0–46.0)
Hemoglobin: 11.7 g/dL — ABNORMAL LOW (ref 12.0–15.0)
Lymphocytes Relative: 29 % (ref 12.0–46.0)
Lymphs Abs: 1.5 10*3/uL (ref 0.7–4.0)
MCHC: 32.3 g/dL (ref 30.0–36.0)
MCV: 82.9 fl (ref 78.0–100.0)
Monocytes Absolute: 0.4 10*3/uL (ref 0.1–1.0)
Monocytes Relative: 8.6 % (ref 3.0–12.0)
Neutro Abs: 3.2 10*3/uL (ref 1.4–7.7)
Neutrophils Relative %: 61.5 % (ref 43.0–77.0)
Platelets: 211 10*3/uL (ref 150.0–400.0)
RBC: 4.38 Mil/uL (ref 3.87–5.11)
RDW: 14 % (ref 11.5–15.5)
WBC: 5.2 10*3/uL (ref 4.0–10.5)

## 2021-05-30 LAB — TSH: TSH: 1.87 u[IU]/mL (ref 0.35–5.50)

## 2021-05-30 LAB — URINALYSIS, ROUTINE W REFLEX MICROSCOPIC
Bilirubin Urine: NEGATIVE
Ketones, ur: NEGATIVE
Leukocytes,Ua: NEGATIVE
Nitrite: NEGATIVE
Specific Gravity, Urine: 1.02 (ref 1.000–1.030)
Total Protein, Urine: NEGATIVE
Urine Glucose: NEGATIVE
Urobilinogen, UA: 0.2 (ref 0.0–1.0)
pH: 6.5 (ref 5.0–8.0)

## 2021-05-30 LAB — COMPREHENSIVE METABOLIC PANEL
ALT: 15 U/L (ref 0–35)
AST: 16 U/L (ref 0–37)
Albumin: 4.3 g/dL (ref 3.5–5.2)
Alkaline Phosphatase: 76 U/L (ref 39–117)
BUN: 10 mg/dL (ref 6–23)
CO2: 28 mEq/L (ref 19–32)
Calcium: 9.3 mg/dL (ref 8.4–10.5)
Chloride: 105 mEq/L (ref 96–112)
Creatinine, Ser: 0.87 mg/dL (ref 0.40–1.20)
GFR: 93.05 mL/min (ref >60.00)
Glucose, Bld: 86 mg/dL (ref 70–99)
Potassium: 4 mEq/L (ref 3.5–5.1)
Sodium: 141 mEq/L (ref 135–145)
Total Bilirubin: 0.5 mg/dL (ref 0.2–1.2)
Total Protein: 7.1 g/dL (ref 6.0–8.3)

## 2021-06-03 ENCOUNTER — Encounter: Payer: Self-pay | Admitting: Internal Medicine

## 2021-08-13 ENCOUNTER — Encounter: Payer: 59 | Admitting: Nurse Practitioner

## 2021-08-14 ENCOUNTER — Telehealth: Payer: 59 | Admitting: Physician Assistant

## 2021-08-14 DIAGNOSIS — J019 Acute sinusitis, unspecified: Secondary | ICD-10-CM

## 2021-08-14 DIAGNOSIS — B9689 Other specified bacterial agents as the cause of diseases classified elsewhere: Secondary | ICD-10-CM | POA: Diagnosis not present

## 2021-08-14 MED ORDER — BENZONATATE 100 MG PO CAPS: 100.0000 mg | ORAL_CAPSULE | Freq: Three times a day (TID) | ORAL | 0 refills | Status: DC | PRN

## 2021-08-14 MED ORDER — IPRATROPIUM BROMIDE 0.03 % NA SOLN: 2.0000 | Freq: Two times a day (BID) | NASAL | 0 refills | Status: DC

## 2021-08-14 MED ORDER — PSEUDOEPH-BROMPHEN-DM 30-2-10 MG/5ML PO SYRP: 5.0000 mL | ORAL_SOLUTION | Freq: Four times a day (QID) | ORAL | 0 refills | Status: DC | PRN

## 2021-08-14 MED ORDER — LEVOFLOXACIN 500 MG PO TABS: 500.0000 mg | ORAL_TABLET | Freq: Every day | ORAL | 0 refills | Status: AC

## 2021-08-14 NOTE — Progress Notes (Signed)
?Virtual Visit Consent  ? ?Hannah Ford, you are scheduled for a virtual visit with a [facility] provider today.   ?  ?Just as with appointments in the office, your consent must be obtained to participate.  Your consent will be active for this visit and any virtual visit you may have with one of our providers in the next 365 days.   ?  ?If you have a MyChart account, a copy of this consent can be sent to you electronically.  All virtual visits are billed to your insurance company just like a traditional visit in the office.   ? ?As this is a virtual visit, video technology does not allow for your provider to perform a traditional examination.  This may limit your provider's ability to fully assess your condition.  If your provider identifies any concerns that need to be evaluated in person or the need to arrange testing (such as labs, EKG, etc.), we will make arrangements to do so.   ?  ?Although advances in technology are sophisticated, we cannot ensure that it will always work on either your end or our end.  If the connection with a video visit is poor, the visit may have to be switched to a telephone visit.  With either a video or telephone visit, we are not always able to ensure that we have a secure connection.    ? ?I need to obtain your verbal consent now.   Are you willing to proceed with your visit today?  ?  ?Green[place] B Ramires has provided verbal consent on 08/14/2021 for a virtual visit (video or telephone). ?  ?Margaretann Loveless, PA-C  ? ?Date: 08/14/2021 5:23 PM ? ? ?Virtual Visit via Video Note  ? ?Hannah Ford, connected with  CEIRA HOESCHEN  ([id number], 11/30/1995) on 08/14/21 at  5:15 PM EST by a video-enabled telemedicine application and verified that I am speaking with the correct person using two identifiers. ? ?Location: ?Patient: Virtual Visit Location Patient: Home ?Provider: Virtual Visit Location Provider: Home Office ?  ?I discussed the limitations of evaluation and management by  telemedicine and the availability of in person appointments. The patient expressed understanding and agreed to proceed.   ? ?History of Present Illness: ?Hannah Ford is a 26 y.o. who identifies as a female who was assigned female at birth, and is being seen today for cough. ? ?HPI: Cough ?This is a new problem. The current episode started 1 to 4 weeks ago. The problem has been gradually worsening. The problem occurs hourly. The cough is Non-productive. Associated symptoms include chills, ear congestion (right side), headaches, myalgias, nasal congestion, postnasal drip, rhinorrhea and a sore throat. Pertinent negatives include no ear pain, fever or sweats. Associated symptoms comments: fatigue. The symptoms are aggravated by lying down. Treatments tried: alka seltzer, delsym, tylenol, aleve, ibuprofen. The treatment provided no relief. Her past medical history is significant for environmental allergies.   ?Was treated for sinus infection in Oct 2022 with Doxycycline, unable to complete treatment due to vomiting. ?Then treated again in Dec 2022 for same issue with Cefdinir, diarrhea and nausea was worse so did not complete, only took a few days worth. ?Now symptoms have returned, worsening since last week, but congestion has been persistent since October. Does take Claritin. ? ?Problems:  ?Patient Active Problem List  ? Diagnosis Date Noted  ? Eczema 05/27/2021  ? Acute sinusitis 05/27/2021  ? Chronic cough 06/12/2019  ? Acute left ankle pain  01/18/2018  ? Upper respiratory infection 10/20/2017  ? Tonsil stone 09/24/2016  ? Hematuria 05/21/2016  ? Back pain 05/21/2016  ? Polyarthralgia 11/19/2015  ? Chest pain 11/19/2015  ? GERD (gastroesophageal reflux disease) 11/19/2015  ? Arthralgia 11/14/2015  ? Menorrhagia 02/20/2015  ?  ?Allergies:  ?Allergies  ?Allergen Reactions  ? Latex Other (See Comments)  ?  unknown ?Other reaction(s): Other (See Comments) ?unknown ?  ? Zithromax [Azithromycin] Other (See Comments)  ?   Face redness and swelling  ? Penicillins Rash  ?  Has patient had a PCN reaction causing immediate rash, facial/tongue/throat swelling, SOB or lightheadedness with hypotension: Yes ?Has patient had a PCN reaction causing severe rash involving mucus membranes or skin necrosis: Yes ?Has patient had a PCN reaction that required hospitalization No ?Has patient had a PCN reaction occurring within the last 10 years: No ?If all of the above answers are "NO", then may proceed with Cephalosporin use. ?  ? ?Medications:  ?Current Outpatient Medications:  ?  benzonatate (TESSALON) 100 MG capsule, Take 1 capsule (100 mg total) by mouth 3 (three) times daily as needed., Disp: 30 capsule, Rfl: 0 ?  brompheniramine-pseudoephedrine-DM 30-2-10 MG/5ML syrup, Take 5 mLs by mouth 4 (four) times daily as needed., Disp: 120 mL, Rfl: 0 ?  ipratropium (ATROVENT) 0.03 % nasal spray, Place 2 sprays into both nostrils every 12 (twelve) hours., Disp: 30 mL, Rfl: 0 ?  levofloxacin (LEVAQUIN) 500 MG tablet, Take 1 tablet (500 mg total) by mouth daily for 7 days., Disp: 7 tablet, Rfl: 0 ?  BIOTIN PO, Take 1 tablet by mouth daily., Disp: , Rfl:  ?  cefdinir (OMNICEF) 300 MG capsule, Take 1 capsule (300 mg total) by mouth 2 (two) times daily., Disp: 28 capsule, Rfl: 0 ?  loratadine (CLARITIN) 10 MG tablet, Take 1 tablet (10 mg total) by mouth daily., Disp: 100 tablet, Rfl: 3 ?  Multiple Vitamin (MULTIVITAMIN WITH MINERALS) TABS tablet, Take 1 tablet by mouth daily., Disp: , Rfl:  ?  triamcinolone cream (KENALOG) 0.1 %, Apply 1 application topically 3 (three) times daily as needed. Rash, Disp: 80 g, Rfl: 3 ? ?Observations/Objective: ?Patient is well-developed, well-nourished in no acute distress.  ?Resting comfortably at home.  ?Head is normocephalic, atraumatic.  ?No labored breathing.  ?Speech is clear and coherent with logical content.  ?Patient is alert and oriented at baseline.  ? ? ?Assessment and Plan: ?1. Acute bacterial sinusitis ?-  levofloxacin (LEVAQUIN) 500 MG tablet; Take 1 tablet (500 mg total) by mouth daily for 7 days.  Dispense: 7 tablet; Refill: 0 ?- ipratropium (ATROVENT) 0.03 % nasal spray; Place 2 sprays into both nostrils every 12 (twelve) hours.  Dispense: 30 mL; Refill: 0 ?- brompheniramine-pseudoephedrine-DM 30-2-10 MG/5ML syrup; Take 5 mLs by mouth 4 (four) times daily as needed.  Dispense: 120 mL; Refill: 0 ?- benzonatate (TESSALON) 100 MG capsule; Take 1 capsule (100 mg total) by mouth 3 (three) times daily as needed.  Dispense: 30 capsule; Refill: 0 ? ?- Worsening symptoms that have not responded to OTC medications.  ?- Will give Levaquin and Ipratropium ?- Tessalon perles and Bromfed DM for cough ?- Continue allergy medications.  ?- Discussed adding flonase ?- Saline nasal rinses, steam treatments ?- Echinacea essential oil ?- Stay well hydrated and get plenty of rest.  ?- Seek in person evaluation if no symptom improvement or if symptoms worsen. ? ?Follow Up Instructions: ?I discussed the assessment and treatment plan with the patient. The patient was  provided an opportunity to ask questions and all were answered. The patient agreed with the plan and demonstrated an understanding of the instructions.  A copy of instructions were sent to the patient via MyChart unless otherwise noted below.  ? ?The patient was advised to call back or seek an in-person evaluation if the symptoms worsen or if the condition fails to improve as anticipated. ? ?Time:  ?I spent 12 minutes with the patient via telehealth technology discussing the above problems/concerns.   ? ?Margaretann Loveless, PA-C ?

## 2021-08-14 NOTE — Patient Instructions (Signed)
Hannah Ford, thank you for joining Margaretann Loveless, PA-C for today's virtual visit.  While this provider is not your primary care provider (PCP), if your PCP is located in our provider database this encounter information will be shared with them immediately following your visit.  Consent: (Patient) Hannah Ford provided verbal consent for this virtual visit at the beginning of the encounter.  Current Medications:  Current Outpatient Medications:    benzonatate (TESSALON) 100 MG capsule, Take 1 capsule (100 mg total) by mouth 3 (three) times daily as needed., Disp: 30 capsule, Rfl: 0   brompheniramine-pseudoephedrine-DM 30-2-10 MG/5ML syrup, Take 5 mLs by mouth 4 (four) times daily as needed., Disp: 120 mL, Rfl: 0   ipratropium (ATROVENT) 0.03 % nasal spray, Place 2 sprays into both nostrils every 12 (twelve) hours., Disp: 30 mL, Rfl: 0   levofloxacin (LEVAQUIN) 500 MG tablet, Take 1 tablet (500 mg total) by mouth daily for 7 days., Disp: 7 tablet, Rfl: 0   BIOTIN PO, Take 1 tablet by mouth daily., Disp: , Rfl:    cefdinir (OMNICEF) 300 MG capsule, Take 1 capsule (300 mg total) by mouth 2 (two) times daily., Disp: 28 capsule, Rfl: 0   loratadine (CLARITIN) 10 MG tablet, Take 1 tablet (10 mg total) by mouth daily., Disp: 100 tablet, Rfl: 3   Multiple Vitamin (MULTIVITAMIN WITH MINERALS) TABS tablet, Take 1 tablet by mouth daily., Disp: , Rfl:    triamcinolone cream (KENALOG) 0.1 %, Apply 1 application topically 3 (three) times daily as needed. Rash, Disp: 80 g, Rfl: 3   Medications ordered in this encounter:  Meds ordered this encounter  Medications   levofloxacin (LEVAQUIN) 500 MG tablet    Sig: Take 1 tablet (500 mg total) by mouth daily for 7 days.    Dispense:  7 tablet    Refill:  0    Order Specific Question:   Supervising Provider    Answer:   MILLER, BRIAN [[id number]]   ipratropium (ATROVENT) 0.03 % nasal spray    Sig: Place 2 sprays into both nostrils every 12 (twelve) hours.     Dispense:  30 mL    Refill:  0    Order Specific Question:   Supervising Provider    Answer:   Hyacinth Meeker, BRIAN [[id number]]   brompheniramine-pseudoephedrine-DM 30-2-10 MG/5ML syrup    Sig: Take 5 mLs by mouth 4 (four) times daily as needed.    Dispense:  120 mL    Refill:  0    Order Specific Question:   Supervising Provider    Answer:   MILLER, BRIAN [[id number]]   benzonatate (TESSALON) 100 MG capsule    Sig: Take 1 capsule (100 mg total) by mouth 3 (three) times daily as needed.    Dispense:  30 capsule    Refill:  0    Order Specific Question:   Supervising Provider    Answer:   Hyacinth Meeker, BRIAN [[id number]]     *If you need refills on other medications prior to your next appointment, please contact your pharmacy*  Follow-Up: Call back or seek an in-person evaluation if the symptoms worsen or if the condition fails to improve as anticipated.  Other Instructions Sinusitis, Adult Sinusitis is inflammation of your sinuses. Sinuses are hollow spaces in the bones around your face. Your sinuses are located: Around your eyes. In the middle of your forehead. Behind your nose. In your cheekbones. Mucus normally drains out of your sinuses. When your nasal tissues become inflamed or  swollen, mucus can become trapped or blocked. This allows bacteria, viruses, and fungi to grow, which leads to infection. Most infections of the sinuses are caused by a virus. Sinusitis can develop quickly. It can last for up to 4 weeks (acute) or for more than 12 weeks (chronic). Sinusitis often develops after a cold. What are the causes? This condition is caused by anything that creates swelling in the sinuses or stops mucus from draining. This includes: Allergies. Asthma. Infection from bacteria or viruses. Deformities or blockages in your nose or sinuses. Abnormal growths in the nose (nasal polyps). Pollutants, such as chemicals or irritants in the air. Infection from fungi (rare). What increases the risk? You are more  likely to develop this condition if you: Have a weak body defense system (immune system). Do a lot of swimming or diving. Overuse nasal sprays. Smoke. What are the signs or symptoms? The main symptoms of this condition are pain and a feeling of pressure around the affected sinuses. Other symptoms include: Stuffy nose or congestion. Thick drainage from your nose. Swelling and warmth over the affected sinuses. Headache. Upper toothache. A cough that may get worse at night. Extra mucus that collects in the throat or the back of the nose (postnasal drip). Decreased sense of smell and taste. Fatigue. A fever. Sore throat. Bad breath. How is this diagnosed? This condition is diagnosed based on: Your symptoms. Your medical history. A physical exam. Tests to find out if your condition is acute or chronic. This may include: Checking your nose for nasal polyps. Viewing your sinuses using a device that has a light (endoscope). Testing for allergies or bacteria. Imaging tests, such as an MRI or CT scan. In rare cases, a bone biopsy may be done to rule out more serious types of fungal sinus disease. How is this treated? Treatment for sinusitis depends on the cause and whether your condition is chronic or acute. If caused by a virus, your symptoms should go away on their own within 10 days. You may be given medicines to relieve symptoms. They include: Medicines that shrink swollen nasal passages (topical intranasal decongestants). Medicines that treat allergies (antihistamines). A spray that eases inflammation of the nostrils (topical intranasal corticosteroids). Rinses that help get rid of thick mucus in your nose (nasal saline washes). If caused by bacteria, your health care provider may recommend waiting to see if your symptoms improve. Most bacterial infections will get better without antibiotic medicine. You may be given antibiotics if you have: A severe infection. A weak immune  system. If caused by narrow nasal passages or nasal polyps, you may need to have surgery. Follow these instructions at home: Medicines Take, use, or apply over-the-counter and prescription medicines only as told by your health care provider. These may include nasal sprays. If you were prescribed an antibiotic medicine, take it as told by your health care provider. Do not stop taking the antibiotic even if you start to feel better. Hydrate and humidify  Drink enough fluid to keep your urine pale yellow. Staying hydrated will help to thin your mucus. Use a cool mist humidifier to keep the humidity level in your home above 50%. Inhale steam for 10-15 minutes, 3-4 times a day, or as told by your health care provider. You can do this in the bathroom while a hot shower is running. Limit your exposure to cool or dry air. Rest Rest as much as possible. Sleep with your head raised (elevated). Make sure you get enough sleep each  night. General instructions  Apply a warm, moist washcloth to your face 3-4 times a day or as told by your health care provider. This will help with discomfort. Wash your hands often with soap and water to reduce your exposure to germs. If soap and water are not available, use hand sanitizer. Do not smoke. Avoid being around people who are smoking (secondhand smoke). Keep all follow-up visits as told by your health care provider. This is important. Contact a health care provider if: You have a fever. Your symptoms get worse. Your symptoms do not improve within 10 days. Get help right away if: You have a severe headache. You have persistent vomiting. You have severe pain or swelling around your face or eyes. You have vision problems. You develop confusion. Your neck is stiff. You have trouble breathing. Summary Sinusitis is soreness and inflammation of your sinuses. Sinuses are hollow spaces in the bones around your face. This condition is caused by nasal tissues  that become inflamed or swollen. The swelling traps or blocks the flow of mucus. This allows bacteria, viruses, and fungi to grow, which leads to infection. If you were prescribed an antibiotic medicine, take it as told by your health care provider. Do not stop taking the antibiotic even if you start to feel better. Keep all follow-up visits as told by your health care provider. This is important. This information is not intended to replace advice given to you by your health care provider. Make sure you discuss any questions you have with your health care provider. Document Revised: 11/02/2017 Document Reviewed: 11/02/2017 Elsevier Patient Education  2022 [employer].    If you have been instructed to have an in-person evaluation today at a local Urgent Care facility, please use the link below. It will take you to a list of all of our available [facility], including address, phone number and hours of operation. Please do not delay care.  [facility]  If you or a family member do not have a primary care provider, use the link below to schedule a visit and establish care. When you choose a [facility] primary care physician or advanced practice provider, you gain a long-term partner in health. Find a Primary Care Provider  Learn more about [facility]'s in-office and virtual care options: [facility] - Get Care Now

## 2021-11-12 ENCOUNTER — Ambulatory Visit: Payer: 59 | Admitting: Internal Medicine

## 2021-11-12 ENCOUNTER — Encounter: Payer: Self-pay | Admitting: Internal Medicine

## 2021-11-12 DIAGNOSIS — J302 Other seasonal allergic rhinitis: Secondary | ICD-10-CM | POA: Diagnosis not present

## 2021-11-12 DIAGNOSIS — M542 Cervicalgia: Secondary | ICD-10-CM | POA: Diagnosis not present

## 2021-11-12 DIAGNOSIS — G43109 Migraine with aura, not intractable, without status migrainosus: Secondary | ICD-10-CM | POA: Diagnosis not present

## 2021-11-12 DIAGNOSIS — J309 Allergic rhinitis, unspecified: Secondary | ICD-10-CM | POA: Insufficient documentation

## 2021-11-12 DIAGNOSIS — G43909 Migraine, unspecified, not intractable, without status migrainosus: Secondary | ICD-10-CM | POA: Insufficient documentation

## 2021-11-12 MED ORDER — IBUPROFEN 600 MG PO TABS: 600.0000 mg | ORAL_TABLET | Freq: Three times a day (TID) | ORAL | 3 refills | Status: DC | PRN

## 2021-11-12 MED ORDER — RIZATRIPTAN BENZOATE 10 MG PO TABS: 10.0000 mg | ORAL_TABLET | Freq: Once | ORAL | 12 refills | Status: DC | PRN

## 2021-11-12 MED ORDER — LEVOCETIRIZINE DIHYDROCHLORIDE 5 MG PO TABS: 5.0000 mg | ORAL_TABLET | Freq: Every evening | ORAL | 1 refills | Status: DC

## 2021-11-12 MED ORDER — FLUTICASONE PROPIONATE 50 MCG/ACT NA SUSP: 2.0000 | Freq: Every day | NASAL | 6 refills | Status: AC

## 2021-11-12 MED ORDER — ONDANSETRON HCL 4 MG PO TABS: 4.0000 mg | ORAL_TABLET | Freq: Three times a day (TID) | ORAL | 0 refills | Status: DC | PRN

## 2021-11-12 NOTE — Assessment & Plan Note (Signed)
Worse Try Xyzal. Claritin - sleepy

## 2021-11-12 NOTE — Assessment & Plan Note (Signed)
Worse Maxalt po prn Ibuprofen 600 mg tid prn

## 2021-11-12 NOTE — Progress Notes (Signed)
Subjective:  Patient ID: Hannah Ford, female    DOB: 1995-06-28  Age: 26 y.o. MRN: [id number]  CC: Headache (Shoulder and jaw stiffness, sharp shooting pains in the mid back of head  )   HPI Hannah Ford presents for HAs - bad migraines 1-2/months; may have speech difficulties w/aura and HAs. C/o neck pain  Outpatient Medications Prior to Visit  Medication Sig Dispense Refill   Ferrous Fumarate (IRON) 18 MG TBCR      Multiple Vitamin (MULTIVITAMIN WITH MINERALS) TABS tablet Take 1 tablet by mouth daily.     BIOTIN PO Take 1 tablet by mouth daily.     benzonatate (TESSALON) 100 MG capsule Take 1 capsule (100 mg total) by mouth 3 (three) times daily as needed. 30 capsule 0   brompheniramine-pseudoephedrine-DM 30-2-10 MG/5ML syrup Take 5 mLs by mouth 4 (four) times daily as needed. 120 mL 0   cefdinir (OMNICEF) 300 MG capsule Take 1 capsule (300 mg total) by mouth 2 (two) times daily. 28 capsule 0   ipratropium (ATROVENT) 0.03 % nasal spray Place 2 sprays into both nostrils every 12 (twelve) hours. 30 mL 0   loratadine (CLARITIN) 10 MG tablet Take 1 tablet (10 mg total) by mouth daily. 100 tablet 3   triamcinolone cream (KENALOG) 0.1 % Apply 1 application topically 3 (three) times daily as needed. Rash 80 g 3   No facility-administered medications prior to visit.    ROS: Review of Systems  Constitutional:  Negative for activity change, appetite change, chills, fatigue and unexpected weight change.  HENT:  Positive for postnasal drip and rhinorrhea. Negative for congestion, mouth sores and sinus pressure.   Eyes:  Negative for visual disturbance.  Respiratory:  Negative for cough and chest tightness.   Gastrointestinal:  Positive for nausea. Negative for abdominal pain and vomiting.  Genitourinary:  Negative for difficulty urinating, frequency and vaginal pain.  Musculoskeletal:  Positive for neck pain and neck stiffness. Negative for back pain and gait problem.  Skin:  Negative for  pallor and rash.  Neurological:  Positive for headaches. Negative for dizziness, tremors, weakness and numbness.  Psychiatric/Behavioral:  Negative for confusion and sleep disturbance.    Objective:  BP 120/70 (BP Location: Left Arm, Patient Position: Sitting, Cuff Size: Large)   Pulse 82   Temp 98.2 F (36.8 C) (Oral)   Ht 5' 6.75" (1.695 m)   Wt 229 lb (103.9 kg)   SpO2 98%   BMI 36.14 kg/m   BP Readings from Last 3 Encounters:  11/12/21 120/70  05/27/21 132/68  01/30/21 (!) 141/73    Wt Readings from Last 3 Encounters:  11/12/21 229 lb (103.9 kg)  05/27/21 235 lb 12.8 oz (107 kg)  08/09/20 235 lb 3.2 oz (106.7 kg)    Physical Exam Constitutional:      General: She is not in acute distress.    Appearance: She is well-developed. She is obese.  HENT:     Head: Normocephalic.     Right Ear: External ear normal.     Left Ear: External ear normal.     Nose: Nose normal.  Eyes:     General:        Right eye: No discharge.        Left eye: No discharge.     Conjunctiva/sclera: Conjunctivae normal.     Pupils: Pupils are equal, round, and reactive to light.  Neck:     Thyroid: No thyromegaly.     Vascular:  No JVD.     Trachea: No tracheal deviation.  Cardiovascular:     Rate and Rhythm: Normal rate and regular rhythm.     Heart sounds: Normal heart sounds.  Pulmonary:     Effort: No respiratory distress.     Breath sounds: No stridor. No wheezing.  Abdominal:     General: Bowel sounds are normal. There is no distension.     Palpations: Abdomen is soft. There is no mass.     Tenderness: There is no abdominal tenderness. There is no guarding or rebound.  Musculoskeletal:        General: No tenderness.     Cervical back: Normal range of motion and neck supple. No rigidity.  Lymphadenopathy:     Cervical: No cervical adenopathy.  Skin:    Findings: No erythema or rash.  Neurological:     Cranial Nerves: No cranial nerve deficit.     Motor: No abnormal muscle  tone.     Coordination: Coordination normal.     Deep Tendon Reflexes: Reflexes normal.  Psychiatric:        Behavior: Behavior normal.        Thought Content: Thought content normal.        Judgment: Judgment normal.  Neck - tense  Lab Results  Component Value Date   WBC 5.2 05/30/2021   HGB 11.7 (L) 05/30/2021   HCT 36.3 05/30/2021   PLT 211.0 05/30/2021   GLUCOSE 86 05/30/2021   CHOL 193 05/30/2021   TRIG 72.0 05/30/2021   HDL 68.50 05/30/2021   LDLCALC 110 (H) 05/30/2021   ALT 15 05/30/2021   AST 16 05/30/2021   NA 141 05/30/2021   K 4.0 05/30/2021   CL 105 05/30/2021   CREATININE 0.87 05/30/2021   BUN 10 05/30/2021   CO2 28 05/30/2021   TSH 1.87 05/30/2021   HGBA1C 5.2 08/09/2020    No results found.  Assessment & Plan:   Problem List Items Addressed This Visit     Allergic rhinitis    Worse Try Xyzal. Claritin - sleepy       Migraine    Worse Maxalt po prn Ibuprofen 600 mg tid prn       Relevant Medications   rizatriptan (MAXALT) 10 MG tablet   ibuprofen (ADVIL) 600 MG tablet   Neck pain    Ergonomic desk Rice sock heating pad Ibuprofen Rx prn          Meds ordered this encounter  Medications   levocetirizine (XYZAL) 5 MG tablet    Sig: Take 1 tablet (5 mg total) by mouth every evening.    Dispense:  90 tablet    Refill:  1   rizatriptan (MAXALT) 10 MG tablet    Sig: Take 1 tablet (10 mg total) by mouth once as needed for up to 1 dose for migraine. May repeat in 2 hours if needed    Dispense:  12 tablet    Refill:  12   ibuprofen (ADVIL) 600 MG tablet    Sig: Take 1 tablet (600 mg total) by mouth every 8 (eight) hours as needed.    Dispense:  60 tablet    Refill:  3   ondansetron (ZOFRAN) 4 MG tablet    Sig: Take 1 tablet (4 mg total) by mouth every 8 (eight) hours as needed for nausea or vomiting.    Dispense:  20 tablet    Refill:  0   fluticasone (FLONASE) 50 MCG/ACT nasal spray  Sig: Place 2 sprays into both nostrils  daily.    Dispense:  16 g    Refill:  6      Follow-up: Return in about 2 months (around 01/12/2022) for a follow-up visit.  Walker Kehr, MD

## 2021-11-12 NOTE — Assessment & Plan Note (Addendum)
Ergonomic desk Rice sock heating pad Ibuprofen Rx prn

## 2022-01-13 ENCOUNTER — Ambulatory Visit: Payer: 59 | Admitting: Internal Medicine

## 2022-01-27 ENCOUNTER — Telehealth: Payer: 59 | Admitting: Family Medicine

## 2022-01-27 ENCOUNTER — Encounter: Payer: Self-pay | Admitting: Internal Medicine

## 2022-01-27 DIAGNOSIS — B9689 Other specified bacterial agents as the cause of diseases classified elsewhere: Secondary | ICD-10-CM | POA: Diagnosis not present

## 2022-01-27 DIAGNOSIS — J019 Acute sinusitis, unspecified: Secondary | ICD-10-CM | POA: Diagnosis not present

## 2022-01-27 MED ORDER — LEVOFLOXACIN 500 MG PO TABS: 500.0000 mg | ORAL_TABLET | Freq: Every day | ORAL | 0 refills | Status: AC

## 2022-01-27 MED ORDER — LIDOCAINE VISCOUS HCL 2 % MT SOLN: 15.0000 mL | OROMUCOSAL | 0 refills | Status: DC | PRN

## 2022-01-27 MED ORDER — DOXYCYCLINE HYCLATE 100 MG PO TABS: 100.0000 mg | ORAL_TABLET | Freq: Two times a day (BID) | ORAL | 0 refills | Status: DC

## 2022-01-27 NOTE — Progress Notes (Signed)
E-Visit for Sinus Problems  We are sorry that you are not feeling well.  Here is how we plan to help!  Based on what you have shared with me it looks like you have sinusitis.  Sinusitis is inflammation and infection in the sinus cavities of the head.  Based on your presentation I believe you most likely have Acute Bacterial Sinusitis.  This is an infection caused by bacteria and is treated with antibiotics. I have prescribed Doxycycline 100mg  by mouth twice a day for 10 days. You may use an oral decongestant such as Mucinex D or if you have glaucoma or high blood pressure use plain Mucinex. Saline nasal spray help and can safely be used as often as needed for congestion.  If you develop worsening sinus pain, fever or notice severe headache and vision changes, or if symptoms are not better after completion of antibiotic, please schedule an appointment with a health care provider.    And, Lidocaine for sore throat. Use tylenol for headache and pains  Sinus infections are not as easily transmitted as other respiratory infection, however we still recommend that you avoid close contact with loved ones, especially the very young and elderly.  Remember to wash your hands thoroughly throughout the day as this is the number one way to prevent the spread of infection!  Home Care: Only take medications as instructed by your medical team. Complete the entire course of an antibiotic. Do not take these medications with alcohol. A steam or ultrasonic humidifier can help congestion.  You can place a towel over your head and breathe in the steam from hot water coming from a faucet. Avoid close contacts especially the very young and the elderly. Cover your mouth when you cough or sneeze. Always remember to wash your hands.  Get Help Right Away If: You develop worsening fever or sinus pain. You develop a severe head ache or visual changes. Your symptoms persist after you have completed your treatment  plan.  Make sure you Understand these instructions. Will watch your condition. Will get help right away if you are not doing well or get worse.  Thank you for choosing an e-visit.  Your e-visit answers were reviewed by a board certified advanced clinical practitioner to complete your personal care plan. Depending upon the condition, your plan could have included both over the counter or prescription medications.  Please review your pharmacy choice. Make sure the pharmacy is open so you can pick up prescription now. If there is a problem, you may contact your provider through and have the prescription routed to another pharmacy.  Your safety is important to [employer]. If you have drug allergies check your prescription carefully.   For the next 24 hours you can use MyChart to ask questions about today's visit, request a non-urgent call back, or ask for a work or school excuse. You will get an email in the next two days asking about your experience. I hope that your e-visit has been valuable and will speed your recovery.  I provided 5 minutes of non face-to-face time during this encounter for chart review, medication and order placement, as well as and documentation.

## 2022-01-27 NOTE — Addendum Note (Signed)
Addended by: Cathlyn Parsons on: 01/27/2022 04:07 PM   Modules accepted: Orders

## 2022-05-13 ENCOUNTER — Ambulatory Visit: Payer: 59 | Admitting: Internal Medicine

## 2022-05-29 ENCOUNTER — Ambulatory Visit (INDEPENDENT_AMBULATORY_CARE_PROVIDER_SITE_OTHER): Payer: 59 | Admitting: Internal Medicine

## 2022-05-29 ENCOUNTER — Encounter: Payer: Self-pay | Admitting: Internal Medicine

## 2022-05-29 VITALS — BP 112/76 | HR 81 | Temp 98.8°F | Ht 66.75 in | Wt 227.2 lb

## 2022-05-29 DIAGNOSIS — G43109 Migraine with aura, not intractable, without status migrainosus: Secondary | ICD-10-CM

## 2022-05-29 DIAGNOSIS — Z0000 Encounter for general adult medical examination without abnormal findings: Secondary | ICD-10-CM | POA: Diagnosis not present

## 2022-05-29 DIAGNOSIS — J0101 Acute recurrent maxillary sinusitis: Secondary | ICD-10-CM

## 2022-05-29 LAB — CBC WITH DIFFERENTIAL/PLATELET
Basophils Absolute: 0 10*3/uL (ref 0.0–0.1)
Basophils Relative: 0.3 % (ref 0.0–3.0)
Eosinophils Absolute: 0 10*3/uL (ref 0.0–0.7)
Eosinophils Relative: 0.4 % (ref 0.0–5.0)
HCT: 37.8 % (ref 36.0–46.0)
Hemoglobin: 12.3 g/dL (ref 12.0–15.0)
Lymphocytes Relative: 26.5 % (ref 12.0–46.0)
Lymphs Abs: 1.9 10*3/uL (ref 0.7–4.0)
MCHC: 32.5 g/dL (ref 30.0–36.0)
MCV: 82.4 fl (ref 78.0–100.0)
Monocytes Absolute: 0.8 10*3/uL (ref 0.1–1.0)
Monocytes Relative: 11 % (ref 3.0–12.0)
Neutro Abs: 4.4 10*3/uL (ref 1.4–7.7)
Neutrophils Relative %: 61.8 % (ref 43.0–77.0)
Platelets: 187 10*3/uL (ref 150.0–400.0)
RBC: 4.59 Mil/uL (ref 3.87–5.11)
RDW: 14.5 % (ref 11.5–15.5)
WBC: 7.1 10*3/uL (ref 4.0–10.5)

## 2022-05-29 LAB — URINALYSIS
Bilirubin Urine: NEGATIVE
Hgb urine dipstick: NEGATIVE
Ketones, ur: 15 — AB
Leukocytes,Ua: NEGATIVE
Nitrite: NEGATIVE
Specific Gravity, Urine: <=1.005 — AB (ref 1.000–1.030)
Total Protein, Urine: NEGATIVE
Urine Glucose: NEGATIVE
Urobilinogen, UA: 0.2 (ref 0.0–1.0)
pH: 7 (ref 5.0–8.0)

## 2022-05-29 LAB — TSH: TSH: 1.38 u[IU]/mL (ref 0.35–5.50)

## 2022-05-29 MED ORDER — SUMATRIPTAN SUCCINATE 100 MG PO TABS: ORAL_TABLET | ORAL | 5 refills | Status: AC

## 2022-05-29 NOTE — Assessment & Plan Note (Addendum)
Recurrent Sinus CT was offered ENT ref

## 2022-05-29 NOTE — Progress Notes (Signed)
Subjective:  Patient ID: Hannah Ford, female    DOB: [birth year]  Age: 26 y.o. MRN: [id number]  CC: Annual Exam   HPI Hannah Ford presents for a well exam C/o sinus infection x 2 months   Outpatient Medications Prior to Visit  Medication Sig Dispense Refill   Ferrous Fumarate (IRON) 18 MG TBCR      fluticasone (FLONASE) 50 MCG/ACT nasal spray Place 2 sprays into both nostrils daily. 16 g 6   ibuprofen (ADVIL) 600 MG tablet Take 1 tablet (600 mg total) by mouth every 8 (eight) hours as needed. 60 tablet 3   levocetirizine (XYZAL) 5 MG tablet Take 1 tablet (5 mg total) by mouth every evening. 90 tablet 1   Multiple Vitamin (MULTIVITAMIN WITH MINERALS) TABS tablet Take 1 tablet by mouth daily.     rizatriptan (MAXALT) 10 MG tablet Take 1 tablet (10 mg total) by mouth once as needed for up to 1 dose for migraine. May repeat in 2 hours if needed 12 tablet 12   lidocaine (XYLOCAINE) 2 % solution Use as directed 15 mLs in the mouth or throat as needed for mouth pain. (Patient not taking: Reported on 05/29/2022) 100 mL 0   ondansetron (ZOFRAN) 4 MG tablet Take 1 tablet (4 mg total) by mouth every 8 (eight) hours as needed for nausea or vomiting. (Patient not taking: Reported on 05/29/2022) 20 tablet 0   No facility-administered medications prior to visit.    ROS: Review of Systems  Constitutional:  Negative for activity change, appetite change, chills, fatigue and unexpected weight change.  HENT:  Negative for congestion, mouth sores and sinus pressure.   Eyes:  Negative for visual disturbance.  Respiratory:  Negative for cough and chest tightness.   Gastrointestinal:  Negative for abdominal pain and nausea.  Genitourinary:  Negative for difficulty urinating, frequency and vaginal pain.  Musculoskeletal:  Negative for back pain and gait problem.  Skin:  Negative for pallor and rash.  Neurological:  Positive for headaches. Negative for dizziness, tremors, weakness and numbness.   Psychiatric/Behavioral:  Negative for confusion and sleep disturbance.     Objective:  BP 112/76 (BP Location: Left Arm)   Pulse 81   Temp 98.8 F (37.1 C) (Oral)   Ht 5' 6.75" (1.695 m)   Wt 227 lb 3.2 oz (103.1 kg)   SpO2 99%   BMI 35.85 kg/m   BP Readings from Last 3 Encounters:  05/29/22 112/76  11/12/21 120/70  05/27/21 132/68    Wt Readings from Last 3 Encounters:  05/29/22 227 lb 3.2 oz (103.1 kg)  11/12/21 229 lb (103.9 kg)  05/27/21 235 lb 12.8 oz (107 kg)    Physical Exam Constitutional:      General: She is not in acute distress.    Appearance: Normal appearance. She is well-developed.  HENT:     Head: Normocephalic.     Right Ear: External ear normal.     Left Ear: External ear normal.     Nose: Nose normal.  Eyes:     General:        Right eye: No discharge.        Left eye: No discharge.     Conjunctiva/sclera: Conjunctivae normal.     Pupils: Pupils are equal, round, and reactive to light.  Neck:     Thyroid: No thyromegaly.     Vascular: No JVD.     Trachea: No tracheal deviation.  Cardiovascular:     Rate  and Rhythm: Normal rate and regular rhythm.     Heart sounds: Normal heart sounds.  Pulmonary:     Effort: No respiratory distress.     Breath sounds: No stridor. No wheezing.  Abdominal:     General: Bowel sounds are normal. There is no distension.     Palpations: Abdomen is soft. There is no mass.     Tenderness: There is no abdominal tenderness. There is no guarding or rebound.  Musculoskeletal:        General: No tenderness.     Cervical back: Normal range of motion and neck supple. No rigidity.  Lymphadenopathy:     Cervical: No cervical adenopathy.  Skin:    Findings: No erythema or rash.  Neurological:     Cranial Nerves: No cranial nerve deficit.     Motor: No abnormal muscle tone.     Coordination: Coordination normal.     Deep Tendon Reflexes: Reflexes normal.  Psychiatric:        Behavior: Behavior normal.         Thought Content: Thought content normal.        Judgment: Judgment normal.   Swollen nasal passages  Lab Results  Component Value Date   WBC 5.2 05/30/2021   HGB 11.7 (L) 05/30/2021   HCT 36.3 05/30/2021   PLT 211.0 05/30/2021   GLUCOSE 86 05/30/2021   CHOL 193 05/30/2021   TRIG 72.0 05/30/2021   HDL 68.50 05/30/2021   LDLCALC 110 (H) 05/30/2021   ALT 15 05/30/2021   AST 16 05/30/2021   NA 141 05/30/2021   K 4.0 05/30/2021   CL 105 05/30/2021   CREATININE 0.87 05/30/2021   BUN 10 05/30/2021   CO2 28 05/30/2021   TSH 1.87 05/30/2021   HGBA1C 5.2 08/09/2020    No results found.  Assessment & Plan:   Problem List Items Addressed This Visit     Well adult exam     We discussed age appropriate health related issues, including available/recomended screening tests and vaccinations. Labs were ordered to be later reviewed . All questions were answered. We discussed one or more of the following - seat belt use, use of sunscreen/sun exposure exercise, fall risk reduction, second hand smoke exposure, firearm use and storage, seat belt use, a need for adhering to healthy diet and exercise. Labs were ordered.  All questions were answered.       Migraine    D/c Maxalt  Will try Imitrex prn      Relevant Medications   SUMAtriptan (IMITREX) 100 MG tablet   Acute sinusitis - Primary    Recurrent Sinus CT was offered ENT ref      Relevant Orders   Ambulatory referral to ENT      Meds ordered this encounter  Medications   SUMAtriptan (IMITREX) 100 MG tablet    Sig: Take one prn migraine. May repeat in 2 hours if headache persists or recurs.    Dispense:  12 tablet    Refill:  5      Follow-up: No follow-ups on file.  Sonda Primes, MD

## 2022-05-29 NOTE — Assessment & Plan Note (Signed)
D/c Maxalt  Will try Imitrex prn

## 2022-05-29 NOTE — Assessment & Plan Note (Signed)

## 2022-05-30 LAB — COMPREHENSIVE METABOLIC PANEL
ALT: 15 U/L (ref 0–35)
AST: 19 U/L (ref 0–37)
Albumin: 4.7 g/dL (ref 3.5–5.2)
Alkaline Phosphatase: 63 U/L (ref 39–117)
BUN: 12 mg/dL (ref 6–23)
CO2: 21 mEq/L (ref 19–32)
Calcium: 9.9 mg/dL (ref 8.4–10.5)
Chloride: 106 mEq/L (ref 96–112)
Creatinine, Ser: 0.91 mg/dL (ref 0.40–1.20)
GFR: 87.55 mL/min (ref >60.00)
Glucose, Bld: 83 mg/dL (ref 70–99)
Potassium: 4.5 mEq/L (ref 3.5–5.1)
Sodium: 140 mEq/L (ref 135–145)
Total Bilirubin: 0.4 mg/dL (ref 0.2–1.2)
Total Protein: 7.5 g/dL (ref 6.0–8.3)

## 2022-07-03 ENCOUNTER — Emergency Department
Admission: EM | Admit: 2022-07-03 | Discharge: 2022-07-03 | Disposition: A | Discharge status: Home / Self Care | Payer: 59 | Attending: Emergency Medicine | Admitting: Emergency Medicine

## 2022-07-03 ENCOUNTER — Encounter: Payer: Self-pay | Admitting: Emergency Medicine

## 2022-07-03 ENCOUNTER — Emergency Department: Payer: 59

## 2022-07-03 ENCOUNTER — Other Ambulatory Visit: Payer: Self-pay

## 2022-07-03 DIAGNOSIS — R1031 Right lower quadrant pain: Secondary | ICD-10-CM | POA: Insufficient documentation

## 2022-07-03 DIAGNOSIS — R102 Pelvic and perineal pain: Secondary | ICD-10-CM

## 2022-07-03 DIAGNOSIS — R109 Unspecified abdominal pain: Secondary | ICD-10-CM | POA: Diagnosis not present

## 2022-07-03 LAB — COMPREHENSIVE METABOLIC PANEL
ALT: 19 U/L (ref 0–44)
AST: 21 U/L (ref 15–41)
Albumin: 4.4 g/dL (ref 3.5–5.0)
Alkaline Phosphatase: 67 U/L (ref 38–126)
Anion gap: 7 (ref 5–15)
BUN: 13 mg/dL (ref 6–20)
CO2: 24 mmol/L (ref 22–32)
Calcium: 9.2 mg/dL (ref 8.9–10.3)
Chloride: 107 mmol/L (ref 98–111)
Creatinine, Ser: 0.78 mg/dL (ref 0.44–1.00)
GFR, Estimated: >60 mL/min (ref >60)
Glucose, Bld: 86 mg/dL (ref 70–99)
Potassium: 3.5 mmol/L (ref 3.5–5.1)
Sodium: 138 mmol/L (ref 135–145)
Total Bilirubin: 0.5 mg/dL (ref 0.3–1.2)
Total Protein: 7.5 g/dL (ref 6.5–8.1)

## 2022-07-03 LAB — URINALYSIS, ROUTINE W REFLEX MICROSCOPIC
Bilirubin Urine: NEGATIVE
Glucose, UA: NEGATIVE mg/dL
Hgb urine dipstick: NEGATIVE
Ketones, ur: NEGATIVE mg/dL
Leukocytes,Ua: NEGATIVE
Nitrite: NEGATIVE
Protein, ur: NEGATIVE mg/dL
Specific Gravity, Urine: 1.02 (ref 1.005–1.030)
pH: 6 (ref 5.0–8.0)

## 2022-07-03 LAB — CBC
HCT: 39.7 % (ref 36.0–46.0)
Hemoglobin: 12.2 g/dL (ref 12.0–15.0)
MCH: 26.3 pg (ref 26.0–34.0)
MCHC: 30.7 g/dL (ref 30.0–36.0)
MCV: 85.6 fL (ref 80.0–100.0)
Platelets: 200 10*3/uL (ref 150–400)
RBC: 4.64 MIL/uL (ref 3.87–5.11)
RDW: 13.9 % (ref 11.5–15.5)
WBC: 7.8 10*3/uL (ref 4.0–10.5)
nRBC: 0 % (ref 0.0–0.2)

## 2022-07-03 LAB — LIPASE, BLOOD: Lipase: 58 U/L — ABNORMAL HIGH (ref 11–51)

## 2022-07-03 LAB — POC URINE PREG, ED: Preg Test, Ur: NEGATIVE

## 2022-07-03 MED ORDER — KETOROLAC TROMETHAMINE 30 MG/ML IJ SOLN
30.0000 mg | Freq: Once | INTRAMUSCULAR | Status: AC
  Administered 2022-07-03: 30 mg via INTRAVENOUS
  Filled 2022-07-03: qty 1

## 2022-07-03 MED ORDER — KETOROLAC TROMETHAMINE 10 MG PO TABS: 10.0000 mg | ORAL_TABLET | Freq: Four times a day (QID) | ORAL | 0 refills | Status: DC | PRN

## 2022-07-03 MED ORDER — IOHEXOL 300 MG/ML  SOLN
100.0000 mL | Freq: Once | INTRAMUSCULAR | Status: AC | PRN
  Administered 2022-07-03: 100 mL via INTRAVENOUS

## 2022-07-03 NOTE — ED Triage Notes (Signed)
Patient to ED from [facility] for RLQ pain with rebound tenderness. Pain ongoing since Tuesday with N/V.

## 2022-07-03 NOTE — ED Provider Notes (Signed)
[facility] Provider Note  Patient Contact: 7:06 PM (approximate)   History   Abdominal Pain   HPI  Hannah Ford is a 27 y.o. female who presents the emergency department complaining of right lower quadrant abdominal pain.  Symptoms have been ongoing for roughly 36 hours.  Pain is primarily in the right lower quadrant though there is some radiation into the back.  No history of nephrolithiasis.  No history of ovarian cyst, colitis, irritable bowel syndrome or other GI complaint.  Patient has all of her abdominal organs at this time.  Went to urgent care and was referred to the emergency department as patient had tenderness in the right lower quadrant with rebound.  Denies fever.  Denies anorexia.     Physical Exam   Triage Vital Signs: ED Triage Vitals  Enc Vitals Group     BP 07/03/22 1813 (!) 148/84     Pulse Rate 07/03/22 1813 70     Resp 07/03/22 1813 18     Temp 07/03/22 1812 98.6 F (37 C)     Temp Source 07/03/22 1812 Oral     SpO2 07/03/22 1813 100 %     Weight 07/03/22 1812 220 lb (99.8 kg)     Height 07/03/22 1812 5\' 7"  (1.702 m)     Head Circumference --      Peak Flow --      Pain Score --      Pain Loc --      Pain Edu? --      Excl. in [place]? --     Most recent vital signs: Vitals:   07/03/22 1812 07/03/22 1813  BP:  (!) 148/84  Pulse:  70  Resp:  18  Temp: 98.6 F (37 C)   SpO2:  100%     General: Alert and in no acute distress.  Cardiovascular:  Good peripheral perfusion Respiratory: Normal respiratory effort without tachypnea or retractions. Lungs CTAB.  Gastrointestinal: Bowel sounds 4 quadrants.  Soft to palpation all quadrants.  Tenderness right lower quadrant with positive rebound tenderness.. No guarding or rigidity. No palpable masses. No distention. No CVA tenderness. Musculoskeletal: Full range of motion to all extremities.  Neurologic:  No gross focal neurologic deficits are appreciated.  Skin:   No rash  noted Other:   ED Results / Procedures / Treatments   Labs (all labs ordered are listed, but only abnormal results are displayed) Labs Reviewed  LIPASE, BLOOD - Abnormal; Notable for the following components:      Result Value   Lipase 58 (*)    All other components within normal limits  URINALYSIS, ROUTINE W REFLEX MICROSCOPIC - Abnormal; Notable for the following components:   Color, Urine YELLOW (*)    APPearance HAZY (*)    All other components within normal limits  COMPREHENSIVE METABOLIC PANEL  CBC  POC URINE PREG, ED     EKG     RADIOLOGY  I personally viewed, evaluated, and interpreted these images as part of my medical decision making, as well as reviewing the written report by the radiologist.  ED Provider Interpretation: CT scan of the abdomen was reveals no acute findings to explain patient's symptoms.  Ultrasound of the pelvis reveals no evidence of torsion or cyst on the ovary.  US PELVIS (TRANSABDOMINAL ONLY)  Result Date: 07/03/2022 CLINICAL DATA:  [id number] Pelvic pain [id number] EXAM: TRANSABDOMINAL ULTRASOUND OF PELVIS DOPPLER ULTRASOUND OF OVARIES TECHNIQUE: Transabdominal ultrasound examination of the pelvis was  performed including evaluation of the uterus, ovaries, adnexal regions, and pelvic cul-de-sac. Color and duplex Doppler ultrasound was utilized to evaluate blood flow to the ovaries. COMPARISON:  CT abdomen pelvis 07/03/2022 FINDINGS: Uterus Measurements: 7.4 x 3 x 4.4 cm = volume: 51 mL. No fibroids or other mass visualized. Endometrium Thickness: 8 mm.  No focal abnormality visualized. Right ovary Measurements: 3.3 x 1.7 x 2.4 cm = volume: 7 mL. Normal appearance/no adnexal mass. Left ovary Not visualized. Pulsed Doppler evaluation demonstrates normal low-resistance arterial and venous waveforms in both ovaries. Other: None. IMPRESSION: 1. Unremarkable pelvic ultrasound on this transabdominal only study. 2. The left ovary not visualized. Electronically  Signed   By: Iven Finn M.D.   On: 07/03/2022 21:18   US PELVIC DOPPLER (TORSION R/O OR MASS ARTERIAL FLOW)  Result Date: 07/03/2022 CLINICAL DATA:  [id number] Pelvic pain [id number] EXAM: TRANSABDOMINAL ULTRASOUND OF PELVIS DOPPLER ULTRASOUND OF OVARIES TECHNIQUE: Transabdominal ultrasound examination of the pelvis was performed including evaluation of the uterus, ovaries, adnexal regions, and pelvic cul-de-sac. Color and duplex Doppler ultrasound was utilized to evaluate blood flow to the ovaries. COMPARISON:  CT abdomen pelvis 07/03/2022 FINDINGS: Uterus Measurements: 7.4 x 3 x 4.4 cm = volume: 51 mL. No fibroids or other mass visualized. Endometrium Thickness: 8 mm.  No focal abnormality visualized. Right ovary Measurements: 3.3 x 1.7 x 2.4 cm = volume: 7 mL. Normal appearance/no adnexal mass. Left ovary Not visualized. Pulsed Doppler evaluation demonstrates normal low-resistance arterial and venous waveforms in both ovaries. Other: None. IMPRESSION: 1. Unremarkable pelvic ultrasound on this transabdominal only study. 2. The left ovary not visualized. Electronically Signed   By: Iven Finn M.D.   On: 07/03/2022 21:18   CT ABDOMEN PELVIS W CONTRAST  Result Date: 07/03/2022 CLINICAL DATA:  Right lower quadrant abdominal pain EXAM: CT ABDOMEN AND PELVIS WITH CONTRAST TECHNIQUE: Multidetector CT imaging of the abdomen and pelvis was performed using the standard protocol following bolus administration of intravenous contrast. RADIATION DOSE REDUCTION: This exam was performed according to the departmental dose-optimization program which includes automated exposure control, adjustment of the mA and/or kV according to patient size and/or use of iterative reconstruction technique. CONTRAST:  152mL OMNIPAQUE IOHEXOL 300 MG/ML  SOLN COMPARISON:  CT renal stone protocol May 23, 2016 FINDINGS: Lower chest: Visualized bibasilar lungs are clear. Normal heart size and no pericardial effusion. Hepatobiliary: No  focal liver abnormality is seen. No gallstones, gallbladder wall thickening, or biliary dilatation. Pancreas: Unremarkable. No pancreatic ductal dilatation or surrounding inflammatory changes. Spleen: Normal in size without focal abnormality. Adrenals/Urinary Tract: Adrenal glands are unremarkable. Kidneys are normal, without renal calculi, focal lesion, or hydronephrosis. Bladder is unremarkable. Stomach/Bowel: Stomach is within normal limits. Appendix appears normal. No evidence of bowel wall thickening, distention, or inflammatory changes. Vascular/Lymphatic: No significant vascular findings are present. No enlarged abdominal or pelvic lymph nodes. Reproductive: Uterus and bilateral adnexa are unremarkable. Other: No abdominal wall hernia or abnormality. No abdominopelvic ascites. Musculoskeletal: No acute or significant osseous findings. IMPRESSION: No etiology for right lower quadrant pain identified. Electronically Signed   By: Beryle Flock M.D.   On: 07/03/2022 19:29    PROCEDURES:  Critical Care performed: No  Procedures   MEDICATIONS ORDERED IN ED: Medications  ketorolac (TORADOL) 30 MG/ML injection 30 mg (has no administration in time range)  iohexol (OMNIPAQUE) 300 MG/ML solution 100 mL (100 mLs Intravenous Contrast Given 07/03/22 1916)     IMPRESSION / MDM / ASSESSMENT AND PLAN / ED COURSE  I reviewed the triage vital signs and the nursing notes.                                 Differential diagnosis includes, but is not limited to, appendicitis, colitis, UTI, pyelonephritis, nephrolithiasis, ectopic pregnancy, pregnancy, ovarian cyst, ovarian torsion  Patient's presentation is most consistent with acute presentation with potential threat to life or bodily function.   Patient's diagnosis is consistent with right pelvic pain.  Patient presents emergency department with right lower quadrant/right pelvic pain.  Patient was tender on exam.  Patient had no other symptoms such as  fevers, chills, nausea vomiting, urinary changes, bowel changes.  Imaging and labs are reassuring at this time.  CT scan reveals no evidence of appendicitis, colitis, nephrolithiasis, pyelonephritis.  Urinalysis, labs are reassuring.  Ultrasound reveals no evidence of cyst or torsion.  Patient will have Toradol for symptom improvement.  Toradol tablets prescribed for the patient at home.  If patient has worsening or changing symptoms she may always return to the emergency department.  Otherwise follow-up primary care as needed.. Patient is given ED precautions to return to the ED for any worsening or new symptoms.     FINAL CLINICAL IMPRESSION(S) / ED DIAGNOSES   Final diagnoses:  Pelvic pain in female     Rx / DC Orders   ED Discharge Orders          Ordered    ketorolac (TORADOL) 10 MG tablet  Every 6 hours PRN        07/03/22 2213             Note:  This document was prepared using Dragon voice recognition software and may include unintentional dictation errors.   Lanette Hampshire 07/03/22 2214    Jene Every, MD 07/06/22 1054

## 2022-07-03 NOTE — ED Triage Notes (Signed)
C/O RLQ abdominal pain since Tuesday. Denies N/V/D.  Sent from [facility], per report, on exam + rebound tenderness.  AAOx3.  Skin warm and dry.  Posture upright and relaxed. NAD

## 2022-07-03 NOTE — ED Notes (Signed)
E signature pad not working. Pt educated on discharge instructions and verbalized understanding.  

## 2022-07-04 ENCOUNTER — Ambulatory Visit: Payer: Commercial Managed Care - PPO | Admitting: Family Medicine

## 2022-07-08 ENCOUNTER — Encounter: Payer: Self-pay | Admitting: Internal Medicine

## 2022-07-10 ENCOUNTER — Ambulatory Visit: Payer: 59 | Admitting: Internal Medicine

## 2022-07-10 VITALS — BP 120/70 | HR 86 | Temp 99.8°F | Ht 67.0 in | Wt 222.6 lb

## 2022-07-10 DIAGNOSIS — N809 Endometriosis, unspecified: Secondary | ICD-10-CM | POA: Diagnosis not present

## 2022-07-10 DIAGNOSIS — K921 Melena: Secondary | ICD-10-CM | POA: Diagnosis not present

## 2022-07-10 DIAGNOSIS — K219 Gastro-esophageal reflux disease without esophagitis: Secondary | ICD-10-CM

## 2022-07-10 DIAGNOSIS — R1084 Generalized abdominal pain: Secondary | ICD-10-CM

## 2022-07-10 MED ORDER — ACETAMINOPHEN-CODEINE 300-30 MG PO TABS: 1.0000 | ORAL_TABLET | Freq: Four times a day (QID) | ORAL | 1 refills | Status: DC | PRN

## 2022-07-10 MED ORDER — CELECOXIB 200 MG PO CAPS: ORAL_CAPSULE | ORAL | 3 refills | Status: DC

## 2022-07-10 MED ORDER — ONDANSETRON HCL 4 MG PO TABS: 4.0000 mg | ORAL_TABLET | Freq: Three times a day (TID) | ORAL | 1 refills | Status: DC | PRN

## 2022-07-10 MED ORDER — PANTOPRAZOLE SODIUM 40 MG PO TBEC: 40.0000 mg | DELAYED_RELEASE_TABLET | Freq: Every day | ORAL | 1 refills | Status: DC

## 2022-07-10 NOTE — Progress Notes (Signed)
Subjective:  Patient ID: Hannah Ford, female    DOB: 11/05/1995  Age: 26 y.o. MRN: [id number]  CC: Abdominal Pain (Upper abdominal pain started Tuesday. Tuesday and Wednesday has blood in stool and urine. Dull pain at lower right back. worsen 2-3 after any meal. Has recent ED visit had scans done on lower front abdominal area only and was told it inflammation. Pain today is only at upper abdominal and lower right back. )   HPI Hannah Ford presents for RLQ abd pain last week - pt went to ER, had pelvic US, CT. On Sat-Sun - bloating, sharp pains started 8-10/10. Pain is worse 30 min after eating... H/o GERD sx's - worse. H/o endometriosis at 61 (laparoscopy by Dr Cherly Hensen) Last BM on Sunday w/blood - gel-like... Did not sleep for several nights...  Outpatient Medications Prior to Visit  Medication Sig Dispense Refill   Ferrous Fumarate (IRON) 18 MG TBCR      fluticasone (FLONASE) 50 MCG/ACT nasal spray Place 2 sprays into both nostrils daily. 16 g 6   levocetirizine (XYZAL) 5 MG tablet Take 1 tablet (5 mg total) by mouth every evening. 90 tablet 1   Multiple Vitamin (MULTIVITAMIN WITH MINERALS) TABS tablet Take 1 tablet by mouth daily.     SUMAtriptan (IMITREX) 100 MG tablet Take one prn migraine. May repeat in 2 hours if headache persists or recurs. 12 tablet 5   ibuprofen (ADVIL) 600 MG tablet Take 1 tablet (600 mg total) by mouth every 8 (eight) hours as needed. 60 tablet 3   ketorolac (TORADOL) 10 MG tablet Take 1 tablet (10 mg total) by mouth every 6 (six) hours as needed. (Patient not taking: Reported on 07/10/2022) 20 tablet 0   No facility-administered medications prior to visit.    ROS: Review of Systems  Constitutional:  Positive for chills and fatigue. Negative for activity change, appetite change and unexpected weight change.  HENT:  Negative for congestion, mouth sores and sinus pressure.   Eyes:  Negative for visual disturbance.  Respiratory:  Negative for cough and chest  tightness.   Gastrointestinal:  Positive for abdominal distention, abdominal pain, blood in stool, constipation and nausea. Negative for diarrhea.  Genitourinary:  Negative for difficulty urinating, frequency and vaginal pain.  Musculoskeletal:  Negative for back pain and gait problem.  Skin:  Negative for pallor and rash.  Neurological:  Positive for weakness. Negative for dizziness, tremors, numbness and headaches.  Hematological:  Does not bruise/bleed easily.  Psychiatric/Behavioral:  Positive for decreased concentration and sleep disturbance. Negative for confusion. The patient is not nervous/anxious.     Objective:  BP 120/70 (BP Location: Left Arm, Patient Position: Sitting, Cuff Size: Large)   Pulse 86   Temp 99.8 F (37.7 C) (Oral)   Ht 5\' 7"  (1.702 m)   Wt 222 lb 9.6 oz (101 kg)   LMP 06/09/2022 (Exact Date)   SpO2 99%   BMI 34.86 kg/m   BP Readings from Last 3 Encounters:  07/10/22 120/70  07/03/22 (!) 141/83  05/29/22 112/76    Wt Readings from Last 3 Encounters:  07/10/22 222 lb 9.6 oz (101 kg)  07/03/22 220 lb (99.8 kg)  05/29/22 227 lb 3.2 oz (103.1 kg)    Physical Exam Constitutional:      General: She is not in acute distress.    Appearance: She is well-developed. She is obese. She is not ill-appearing or toxic-appearing.  HENT:     Head: Normocephalic.  Right Ear: External ear normal.     Left Ear: External ear normal.     Nose: Nose normal.  Eyes:     General:        Right eye: No discharge.        Left eye: No discharge.     Conjunctiva/sclera: Conjunctivae normal.     Pupils: Pupils are equal, round, and reactive to light.  Neck:     Thyroid: No thyromegaly.     Vascular: No JVD.     Trachea: No tracheal deviation.  Cardiovascular:     Rate and Rhythm: Normal rate and regular rhythm.     Heart sounds: Normal heart sounds.  Pulmonary:     Effort: No respiratory distress.     Breath sounds: No stridor. No wheezing.  Abdominal:      General: Bowel sounds are normal. There is abdominal bruit. There is no distension.     Palpations: Abdomen is soft. There is no mass.     Tenderness: There is no abdominal tenderness. There is no guarding or rebound.  Musculoskeletal:        General: No tenderness.     Cervical back: Normal range of motion and neck supple. No rigidity.  Lymphadenopathy:     Cervical: No cervical adenopathy.  Skin:    Findings: No erythema or rash.  Neurological:     Cranial Nerves: No cranial nerve deficit.     Motor: No abnormal muscle tone.     Coordination: Coordination normal.     Deep Tendon Reflexes: Reflexes normal.  Psychiatric:        Behavior: Behavior normal.        Thought Content: Thought content normal.        Judgment: Judgment normal.   Tender abd in the lower 1/2 No rebound No mass Pt looks tired   Lab Results  Component Value Date   WBC 7.8 07/03/2022   HGB 12.2 07/03/2022   HCT 39.7 07/03/2022   PLT 200 07/03/2022   GLUCOSE 86 07/03/2022   CHOL 193 05/30/2021   TRIG 72.0 05/30/2021   HDL 68.50 05/30/2021   LDLCALC 110 (H) 05/30/2021   ALT 19 07/03/2022   AST 21 07/03/2022   NA 138 07/03/2022   K 3.5 07/03/2022   CL 107 07/03/2022   CREATININE 0.78 07/03/2022   BUN 13 07/03/2022   CO2 24 07/03/2022   TSH 1.38 05/29/2022   HGBA1C 5.2 08/09/2020    US PELVIS (TRANSABDOMINAL ONLY)  Result Date: 07/03/2022 CLINICAL DATA:  [id number] Pelvic pain [id number] EXAM: TRANSABDOMINAL ULTRASOUND OF PELVIS DOPPLER ULTRASOUND OF OVARIES TECHNIQUE: Transabdominal ultrasound examination of the pelvis was performed including evaluation of the uterus, ovaries, adnexal regions, and pelvic cul-de-sac. Color and duplex Doppler ultrasound was utilized to evaluate blood flow to the ovaries. COMPARISON:  CT abdomen pelvis 07/03/2022 FINDINGS: Uterus Measurements: 7.4 x 3 x 4.4 cm = volume: 51 mL. No fibroids or other mass visualized. Endometrium Thickness: 8 mm.  No focal abnormality visualized.  Right ovary Measurements: 3.3 x 1.7 x 2.4 cm = volume: 7 mL. Normal appearance/no adnexal mass. Left ovary Not visualized. Pulsed Doppler evaluation demonstrates normal low-resistance arterial and venous waveforms in both ovaries. Other: None. IMPRESSION: 1. Unremarkable pelvic ultrasound on this transabdominal only study. 2. The left ovary not visualized. Electronically Signed   By: Iven Finn M.D.   On: 07/03/2022 21:18   US PELVIC DOPPLER (TORSION R/O OR MASS ARTERIAL FLOW)  Result Date: 07/03/2022 CLINICAL  DATA:  [id number] Pelvic pain [id number] EXAM: TRANSABDOMINAL ULTRASOUND OF PELVIS DOPPLER ULTRASOUND OF OVARIES TECHNIQUE: Transabdominal ultrasound examination of the pelvis was performed including evaluation of the uterus, ovaries, adnexal regions, and pelvic cul-de-sac. Color and duplex Doppler ultrasound was utilized to evaluate blood flow to the ovaries. COMPARISON:  CT abdomen pelvis 07/03/2022 FINDINGS: Uterus Measurements: 7.4 x 3 x 4.4 cm = volume: 51 mL. No fibroids or other mass visualized. Endometrium Thickness: 8 mm.  No focal abnormality visualized. Right ovary Measurements: 3.3 x 1.7 x 2.4 cm = volume: 7 mL. Normal appearance/no adnexal mass. Left ovary Not visualized. Pulsed Doppler evaluation demonstrates normal low-resistance arterial and venous waveforms in both ovaries. Other: None. IMPRESSION: 1. Unremarkable pelvic ultrasound on this transabdominal only study. 2. The left ovary not visualized. Electronically Signed   By: Tish Frederickson M.D.   On: 07/03/2022 21:18   CT ABDOMEN PELVIS W CONTRAST  Result Date: 07/03/2022 CLINICAL DATA:  Right lower quadrant abdominal pain EXAM: CT ABDOMEN AND PELVIS WITH CONTRAST TECHNIQUE: Multidetector CT imaging of the abdomen and pelvis was performed using the standard protocol following bolus administration of intravenous contrast. RADIATION DOSE REDUCTION: This exam was performed according to the departmental dose-optimization program which  includes automated exposure control, adjustment of the mA and/or kV according to patient size and/or use of iterative reconstruction technique. CONTRAST:  OMNIPAQUE IOHEXOL 300 MG/ML  SOLN COMPARISON:  CT renal stone protocol May 23, 2016 FINDINGS: Lower chest: Visualized bibasilar lungs are clear. Normal heart size and no pericardial effusion. Hepatobiliary: No focal liver abnormality is seen. No gallstones, gallbladder wall thickening, or biliary dilatation. Pancreas: Unremarkable. No pancreatic ductal dilatation or surrounding inflammatory changes. Spleen: Normal in size without focal abnormality. Adrenals/Urinary Tract: Adrenal glands are unremarkable. Kidneys are normal, without renal calculi, focal lesion, or hydronephrosis. Bladder is unremarkable. Stomach/Bowel: Stomach is within normal limits. Appendix appears normal. No evidence of bowel wall thickening, distention, or inflammatory changes. Vascular/Lymphatic: No significant vascular findings are present. No enlarged abdominal or pelvic lymph nodes. Reproductive: Uterus and bilateral adnexa are unremarkable. Other: No abdominal wall hernia or abnormality. No abdominopelvic ascites. Musculoskeletal: No acute or significant osseous findings. IMPRESSION: No etiology for right lower quadrant pain identified. Electronically Signed   By: Jacob Moores M.D.   On: 07/03/2022 19:29    Assessment & Plan:   Problem List Items Addressed This Visit       Digestive   GERD (gastroesophageal reflux disease)    Worse.  Given pantoprazole 40 mg daily      Relevant Medications   pantoprazole (PROTONIX) 40 MG tablet   ondansetron (ZOFRAN) 4 MG tablet     Other   Abdominal pain    Severe abdominal pain of several days duration.  Endometriosis versus inflammatory bowel disease are most likely considerations.  S/p CT, pelvic ultrasound      Relevant Orders   Ambulatory referral to Gastroenterology   Endometriosis    H/o endometriosis at 45  (laparoscopy by Dr Cherly Hensen) Will start on Celebrex po Zofran as needed nausea T#3 prn pain  Potential benefits of a short term opioids use as well as potential risks (i.e. addiction risk, apnea etc) and complications (i.e. Somnolence, constipation and others) were explained to the patient and were aknowledged.  Hannah Ford will call her GYN office herself as soon as possible      Hematochezia - Primary    IBD vs endometriosis GI ref      Relevant Orders   Ambulatory referral  to Gastroenterology      Meds ordered this encounter  Medications   celecoxib (CELEBREX) 200 MG capsule    Sig: Take 400 mg today, then 200 mg/d pc    Dispense:  60 capsule    Refill:  3   pantoprazole (PROTONIX) 40 MG tablet    Sig: Take 1 tablet (40 mg total) by mouth daily.    Dispense:  30 tablet    Refill:  1   acetaminophen-codeine (TYLENOL/CODEINE #3) 300-30 MG tablet    Sig: Take 1-2 tablets by mouth every 6 (six) hours as needed.    Dispense:  20 tablet    Refill:  1   ondansetron (ZOFRAN) 4 MG tablet    Sig: Take 1 tablet (4 mg total) by mouth every 8 (eight) hours as needed for nausea or vomiting.    Dispense:  20 tablet    Refill:  1      Follow-up: No follow-ups on file.  Walker Kehr, MD

## 2022-07-10 NOTE — Assessment & Plan Note (Addendum)
IBD vs endometriosis GI ref

## 2022-07-10 NOTE — Assessment & Plan Note (Addendum)
H/o endometriosis at 64 (laparoscopy by Dr Garwin Brothers) Will start on Celebrex po Zofran as needed nausea T#3 prn pain  Potential benefits of a short term opioids use as well as potential risks (i.e. addiction risk, apnea etc) and complications (i.e. Somnolence, constipation and others) were explained to the patient and were aknowledged.  Hannah Ford will call her GYN office herself as soon as possible

## 2022-07-13 DIAGNOSIS — R109 Unspecified abdominal pain: Secondary | ICD-10-CM | POA: Insufficient documentation

## 2022-07-13 NOTE — Assessment & Plan Note (Signed)
Severe abdominal pain of several days duration.  Endometriosis versus inflammatory bowel disease are most likely considerations.  S/p CT, pelvic ultrasound

## 2022-07-13 NOTE — Assessment & Plan Note (Signed)
Worse.  Given pantoprazole 40 mg daily

## 2022-07-14 ENCOUNTER — Ambulatory Visit: Payer: 59 | Admitting: Internal Medicine

## 2022-07-15 ENCOUNTER — Encounter: Payer: Self-pay | Admitting: Internal Medicine

## 2022-07-15 ENCOUNTER — Telehealth: Payer: Self-pay | Admitting: Internal Medicine

## 2022-07-15 DIAGNOSIS — N809 Endometriosis, unspecified: Secondary | ICD-10-CM

## 2022-07-15 DIAGNOSIS — R1084 Generalized abdominal pain: Secondary | ICD-10-CM

## 2022-07-15 NOTE — Telephone Encounter (Signed)
Patient's father called and stated patient is in extreme pain. She is unable to eat/drink for the past 3 days. They believe it is due to endometriosis. They need a stat referral to an OB/GYN. They requested [facility]. Father left a callback number of [phone]

## 2022-07-16 ENCOUNTER — Telehealth: Payer: Self-pay | Admitting: Internal Medicine

## 2022-07-16 ENCOUNTER — Emergency Department: Payer: 59

## 2022-07-16 ENCOUNTER — Other Ambulatory Visit: Payer: Self-pay

## 2022-07-16 ENCOUNTER — Emergency Department
Admission: EM | Admit: 2022-07-16 | Discharge: 2022-07-16 | Disposition: A | Discharge status: Home / Self Care | Payer: 59 | Attending: Emergency Medicine | Admitting: Emergency Medicine

## 2022-07-16 DIAGNOSIS — Z1152 Encounter for screening for COVID-19: Secondary | ICD-10-CM | POA: Diagnosis not present

## 2022-07-16 DIAGNOSIS — R1013 Epigastric pain: Secondary | ICD-10-CM | POA: Insufficient documentation

## 2022-07-16 DIAGNOSIS — R109 Unspecified abdominal pain: Secondary | ICD-10-CM | POA: Diagnosis present

## 2022-07-16 LAB — URINALYSIS, ROUTINE W REFLEX MICROSCOPIC
Bacteria, UA: NONE SEEN
RBC / HPF: >50 RBC/hpf (ref 0–5)
Specific Gravity, Urine: 1.012 (ref 1.005–1.030)
WBC, UA: >50 WBC/hpf (ref 0–5)

## 2022-07-16 LAB — RESP PANEL BY RT-PCR (RSV, FLU A&B, COVID)  RVPGX2
Influenza A by PCR: NEGATIVE
Influenza B by PCR: NEGATIVE
Resp Syncytial Virus by PCR: NEGATIVE
SARS Coronavirus 2 by RT PCR: NEGATIVE

## 2022-07-16 LAB — COMPREHENSIVE METABOLIC PANEL
ALT: 13 U/L (ref 0–44)
AST: 27 U/L (ref 15–41)
Albumin: 4.1 g/dL (ref 3.5–5.0)
Alkaline Phosphatase: 64 U/L (ref 38–126)
Anion gap: 9 (ref 5–15)
BUN: 10 mg/dL (ref 6–20)
CO2: 23 mmol/L (ref 22–32)
Calcium: 9.2 mg/dL (ref 8.9–10.3)
Chloride: 106 mmol/L (ref 98–111)
Creatinine, Ser: 0.85 mg/dL (ref 0.44–1.00)
GFR, Estimated: >60 mL/min (ref >60)
Glucose, Bld: 85 mg/dL (ref 70–99)
Potassium: 4.7 mmol/L (ref 3.5–5.1)
Sodium: 138 mmol/L (ref 135–145)
Total Bilirubin: 0.9 mg/dL (ref 0.3–1.2)
Total Protein: 7.5 g/dL (ref 6.5–8.1)

## 2022-07-16 LAB — CBC
HCT: 40.2 % (ref 36.0–46.0)
Hemoglobin: 12.6 g/dL (ref 12.0–15.0)
MCH: 26.6 pg (ref 26.0–34.0)
MCHC: 31.3 g/dL (ref 30.0–36.0)
MCV: 85 fL (ref 80.0–100.0)
Platelets: 214 10*3/uL (ref 150–400)
RBC: 4.73 MIL/uL (ref 3.87–5.11)
RDW: 13.2 % (ref 11.5–15.5)
WBC: 6.9 10*3/uL (ref 4.0–10.5)
nRBC: 0 % (ref 0.0–0.2)

## 2022-07-16 LAB — POC URINE PREG, ED: Preg Test, Ur: NEGATIVE

## 2022-07-16 LAB — LIPASE, BLOOD: Lipase: 47 U/L (ref 11–51)

## 2022-07-16 LAB — PREGNANCY, URINE: Preg Test, Ur: NEGATIVE

## 2022-07-16 MED ORDER — ALUM & MAG HYDROXIDE-SIMETH 200-200-20 MG/5ML PO SUSP
30.0000 mL | Freq: Once | ORAL | Status: AC
  Administered 2022-07-16: 30 mL via ORAL
  Filled 2022-07-16: qty 30

## 2022-07-16 MED ORDER — OMEPRAZOLE 20 MG PO CPDR: 20.0000 mg | DELAYED_RELEASE_CAPSULE | Freq: Every day | ORAL | 0 refills | Status: DC

## 2022-07-16 MED ORDER — ONDANSETRON HCL 4 MG/2ML IJ SOLN
4.0000 mg | Freq: Once | INTRAMUSCULAR | Status: AC
  Administered 2022-07-16: 4 mg via INTRAVENOUS
  Filled 2022-07-16: qty 2

## 2022-07-16 MED ORDER — SODIUM CHLORIDE 0.9 % IV BOLUS
1000.0000 mL | Freq: Once | INTRAVENOUS | Status: AC
  Administered 2022-07-16: 1000 mL via INTRAVENOUS

## 2022-07-16 MED ORDER — FAMOTIDINE 20 MG PO TABS: 20.0000 mg | ORAL_TABLET | Freq: Two times a day (BID) | ORAL | 0 refills | Status: DC

## 2022-07-16 NOTE — Telephone Encounter (Signed)
I am sorry!  Will do. Ambriana should go to the emergency room if the pain is severe. Thank you

## 2022-07-16 NOTE — ED Triage Notes (Signed)
First nurse Note:  C/O fatigue, weakness since Saturday.  Sent from [facility] for ED evaluation.  Seem Through ED on 1/18 for same.  Has hx of endometriosis.  VS wnl.  Patient is AAOx3.  Skin warm and dry. NAD

## 2022-07-16 NOTE — Telephone Encounter (Signed)
For our records:  We have received FMLA forms for the pt and they have been placed in Dr. Plotnikov's boxes.   Upon completion please fax to:  [phone] 

## 2022-07-16 NOTE — Telephone Encounter (Signed)
Called pt father no answer LMOM referral change to [facility].Marland KitchenJohny Ford

## 2022-07-16 NOTE — ED Notes (Signed)
Lab called to come and get labs. Unable to get any blood at this time.

## 2022-07-16 NOTE — ED Provider Notes (Signed)
[facility] Provider Note    Event Date/Time   First MD Initiated Contact with Patient 07/16/22 1336     (approximate)   History   Abdominal Pain   HPI  Hannah Ford is a 27 y.o. female with a past medical history of endometriosis, GERD, chronic back pain who presents today for evaluation of abdominal pain.  Patient reports that her symptoms have been ongoing since 07/03/2022.  She reports that it started in her low abdomen, for which she came to the emergency department.  She reports that now her pain is in her upper abdomen.  She denies radiation of pain to her back.  She reports that her pain is worsened with eating.  She feels nauseated and therefore has not had anything to eat in the past couple of days.  She thinks that she is dehydrated.  She has not had any vomiting or diarrhea.  No fevers or chills.  She is wondering if this has anything to do with her endometriosis.  She reports that she is currently menstruating.  Patient Active Problem List   Diagnosis Date Noted   Abdominal pain 07/13/2022   Hematochezia 07/10/2022   Endometriosis 07/10/2022   Well adult exam 05/29/2022   Migraine 11/12/2021   Allergic rhinitis 11/12/2021   Neck pain 11/12/2021   Eczema 05/27/2021   Acute sinusitis 05/27/2021   Contact with other sharp object(s), not elsewhere classified, initial encounter 01/29/2021   Unspecified open wound, right lower leg, initial encounter 01/27/2021   Chronic cough 06/12/2019   Acute left ankle pain 01/18/2018   Upper respiratory infection 10/20/2017   Tonsillith 09/24/2016   Hematuria 05/21/2016   Back pain 05/21/2016   GERD (gastroesophageal reflux disease) 11/19/2015   ANA positive 08/24/2015   Chronic midline low back pain with bilateral sciatica 08/24/2015   Malar rash 08/24/2015   Vitamin D deficiency 08/24/2015          Physical Exam   Triage Vital Signs: ED Triage Vitals  Enc Vitals Group     BP 07/16/22 1234 138/78      Pulse Rate 07/16/22 1234 65     Resp 07/16/22 1234 18     Temp 07/16/22 1234 98 F (36.7 C)     Temp src --      SpO2 07/16/22 1234 100 %     Weight --      Height --      Head Circumference --      Peak Flow --      Pain Score 07/16/22 1233 6     Pain Loc --      Pain Edu? --      Excl. in [place]? --     Most recent vital signs: Vitals:   07/16/22 1234 07/16/22 1514  BP: 138/78 129/77  Pulse: 65 (!) 52  Resp: 18 17  Temp: 98 F (36.7 C) (!) 97.5 F (36.4 C)  SpO2: 100% 100%    Physical Exam Vitals and nursing note reviewed.  Constitutional:      General: Awake and alert. No acute distress.    Appearance: Normal appearance. The patient is normal weight.  HENT:     Head: Normocephalic and atraumatic.     Mouth: Mucous membranes are moist.  Eyes:     General: PERRL. Normal EOMs        Right eye: No discharge.        Left eye: No discharge.     Conjunctiva/sclera:  Conjunctivae normal.  Cardiovascular:     Rate and Rhythm: Normal rate and regular rhythm.     Pulses: Normal pulses.  Pulmonary:     Effort: Pulmonary effort is normal. No respiratory distress.     Breath sounds: Normal breath sounds.  Abdominal:     Abdomen is soft. There is no abdominal tenderness. No rebound or guarding. No distention. Musculoskeletal:        General: No swelling. Normal range of motion.     Cervical back: Normal range of motion and neck supple.  Skin:    General: Skin is warm and dry.     Capillary Refill: Capillary refill takes less than 2 seconds.     Findings: No rash.  Neurological:     Mental Status: The patient is awake and alert.      ED Results / Procedures / Treatments   Labs (all labs ordered are listed, but only abnormal results are displayed) Labs Reviewed  URINALYSIS, ROUTINE W REFLEX MICROSCOPIC - Abnormal; Notable for the following components:      Result Value   Color, Urine RED (*)    APPearance CLOUDY (*)    Glucose, UA   (*)    Value: TEST NOT  REPORTED DUE TO COLOR INTERFERENCE OF URINE PIGMENT   Hgb urine dipstick   (*)    Value: TEST NOT REPORTED DUE TO COLOR INTERFERENCE OF URINE PIGMENT   Bilirubin Urine   (*)    Value: TEST NOT REPORTED DUE TO COLOR INTERFERENCE OF URINE PIGMENT   Ketones, ur   (*)    Value: TEST NOT REPORTED DUE TO COLOR INTERFERENCE OF URINE PIGMENT   Protein, ur   (*)    Value: TEST NOT REPORTED DUE TO COLOR INTERFERENCE OF URINE PIGMENT   Nitrite   (*)    Value: TEST NOT REPORTED DUE TO COLOR INTERFERENCE OF URINE PIGMENT   Leukocytes,Ua   (*)    Value: TEST NOT REPORTED DUE TO COLOR INTERFERENCE OF URINE PIGMENT   All other components within normal limits  RESP PANEL BY RT-PCR (RSV, FLU A&B, COVID)  RVPGX2  LIPASE, BLOOD  COMPREHENSIVE METABOLIC PANEL  CBC  PREGNANCY, URINE  POC URINE PREG, ED     EKG     RADIOLOGY I independently reviewed and interpreted imaging and agree with radiologists findings.     PROCEDURES:  Critical Care performed:   Procedures   MEDICATIONS ORDERED IN ED: Medications  sodium chloride 0.9 % bolus 1,000 mL (0 mLs Intravenous Stopped 07/16/22 1604)  alum & mag hydroxide-simeth (MAALOX/MYLANTA) 200-200-20 MG/5ML suspension 30 mL (30 mLs Oral Given 07/16/22 1401)  ondansetron (ZOFRAN) injection 4 mg (4 mg Intravenous Given 07/16/22 1401)     IMPRESSION / MDM / ASSESSMENT AND PLAN / ED COURSE  I reviewed the triage vital signs and the nursing notes.   Differential diagnosis includes, but is not limited to, biliary colic, gastritis, peptic ulcer disease, cholecystitis, pancreatitis, gastroenteritis, influenza, endometriosis.  Less likely ovarian etiology or appendicitis given recent negative imaging.  I reviewed the patient's chart.  Patient was seen on 07/03/2022 in the emergency department and had a pelvic ultrasound and a CT abdomen and pelvis which were both normal.  Patient has subsequently followed up with outpatient provider.  Patient is awake and  alert, hemodynamically stable and afebrile.  She has mild tenderness in the epigastrium without rebound or guarding.  Ultrasound of right upper quadrant obtained demonstrates no gallstones or evidence of cholecystitis.  She has normal LFTs, recent CT scan demonstrates no liver abnormalities.  She was treated symptomatically with Zofran and a GI cocktail with complete resolution of her pain.  She has blood in her urine, though reports that she is currently menstruating and has no urinary complaints.  Upon reevaluation, patient reports that she does have worsening discomfort with spicy food and acidic food.  We discussed the importance of staying away from gastric irritants while her stomach is healing.  There may be a component of her endometriosis as this is coinciding with her menstrual cycle and she was instructed to follow-up with OB/GYN.  She was also instructed to follow-up with GI if her pain does not improve with the PPI/H2 blockers and dietary modifications.  She is nontoxic in appearance, hemodynamically stable, and is improved with treatment in the emergency department.  I do not feel that she requires any sort of emergent specialty consultation or admission at this time.  We discussed return precautions and the importance of close outpatient follow-up.  Patient understands and agrees with plan.  She was discharged in stable condition with her mother.   Patient's presentation is most consistent with acute complicated illness / injury requiring diagnostic workup.   FINAL CLINICAL IMPRESSION(S) / ED DIAGNOSES   Final diagnoses:  Epigastric abdominal pain     Rx / DC Orders   ED Discharge Orders          Ordered    famotidine (PEPCID) 20 MG tablet  2 times daily        07/16/22 1549    omeprazole (PRILOSEC) 20 MG capsule  Daily        07/16/22 1549             Note:  This document was prepared using Dragon voice recognition software and may include unintentional dictation  errors.   Emeline Gins 07/16/22 1623    Vanessa [place], MD 07/17/22 [phone]

## 2022-07-16 NOTE — Telephone Encounter (Signed)
PT's father calls today in regards to this matter. [facility] did receive the referral however they will not accept it unless it is directed strictly to them. Current referral shows as a generic Gynecology referral.  CB: [phone]

## 2022-07-16 NOTE — Discharge Instructions (Signed)
Please try to stay away from spicy foods, acidic foods, caffeinated beverages, and alcohol.  You may take the medications as prescribed to help your symptoms.  If your symptoms persist, please follow-up with gastroenterology.  Please also follow-up with OB/GYN for care of your endometriosis.  Return for any new, worsening, or change in symptoms or other concerns.  It was a pleasure caring for you today.

## 2022-07-16 NOTE — ED Triage Notes (Signed)
Pt comes with c/o belly pain for few days. Pt stats she just feels tired. Pt stats this all started the 18th. Pt was seen here in ED For same. Pt states pain was radiating to her back. Pt states pain has gotten worse. Pt states some nausea and vomiting. Pt states some head pain as well. Pt unable to keep anything down.

## 2022-07-17 NOTE — Telephone Encounter (Signed)
MD completed form fax back to [employer], and replied bck via mychart letting pt know she can pick up original.../lmb

## 2022-07-17 NOTE — Telephone Encounter (Signed)
Place form on MD desk for completion.Marland KitchenJohny Ford

## 2022-07-21 DIAGNOSIS — E860 Dehydration: Secondary | ICD-10-CM | POA: Diagnosis not present

## 2022-07-21 DIAGNOSIS — K5900 Constipation, unspecified: Secondary | ICD-10-CM | POA: Diagnosis not present

## 2022-07-21 DIAGNOSIS — N920 Excessive and frequent menstruation with regular cycle: Secondary | ICD-10-CM | POA: Diagnosis not present

## 2022-07-21 DIAGNOSIS — N809 Endometriosis, unspecified: Secondary | ICD-10-CM | POA: Diagnosis not present

## 2022-07-21 DIAGNOSIS — R1013 Epigastric pain: Secondary | ICD-10-CM | POA: Diagnosis not present

## 2022-07-21 DIAGNOSIS — N944 Primary dysmenorrhea: Secondary | ICD-10-CM | POA: Diagnosis not present

## 2022-07-23 ENCOUNTER — Telehealth: Payer: Self-pay | Admitting: Internal Medicine

## 2022-07-23 NOTE — Telephone Encounter (Signed)
Called pt no answer will need office visit LMOM RTC.Marland KitchenJohny Ford

## 2022-07-23 NOTE — Telephone Encounter (Signed)
Pt  mother called stating she would like a call back form the nurse. Pt appt with the OBGYN is in April and it to far out pt is feeling pain in her mid chest  and she also having pain when digesting food/liquid. BP 94/58 Pulse 46 as of 8:08 am. I schedule appt  with vickie Henson on feb 9 at 3:40  Please give pt a called back with advice.

## 2022-07-23 NOTE — Telephone Encounter (Signed)
Pt called back inform her MD did not have anything sooner as she will need ov. Advise pt to keep appt w/ NP Vickie for 2/9. If she feel that she is getting worse to go to Er for evaluation../lm,b

## 2022-07-25 ENCOUNTER — Encounter: Payer: Self-pay | Admitting: Family Medicine

## 2022-07-25 ENCOUNTER — Ambulatory Visit: Payer: 59 | Admitting: Family Medicine

## 2022-07-25 VITALS — BP 108/72 | HR 66 | Temp 97.6°F | Ht 67.0 in | Wt 215.0 lb

## 2022-07-25 DIAGNOSIS — K219 Gastro-esophageal reflux disease without esophagitis: Secondary | ICD-10-CM

## 2022-07-25 DIAGNOSIS — R1013 Epigastric pain: Secondary | ICD-10-CM | POA: Diagnosis not present

## 2022-07-25 DIAGNOSIS — R110 Nausea: Secondary | ICD-10-CM

## 2022-07-25 DIAGNOSIS — R10814 Left lower quadrant abdominal tenderness: Secondary | ICD-10-CM | POA: Diagnosis not present

## 2022-07-25 DIAGNOSIS — R638 Other symptoms and signs concerning food and fluid intake: Secondary | ICD-10-CM

## 2022-07-25 MED ORDER — PANTOPRAZOLE SODIUM 40 MG PO TBEC: 40.0000 mg | DELAYED_RELEASE_TABLET | Freq: Every day | ORAL | 1 refills | Status: DC

## 2022-07-25 MED ORDER — SUCRALFATE 1 GM/10ML PO SUSP: 1.0000 g | Freq: Three times a day (TID) | ORAL | 0 refills | Status: DC

## 2022-07-25 NOTE — Progress Notes (Signed)
Subjective:     Patient ID: Hannah Ford, female    DOB: December 29, 1995, 27 y.o.   MRN: [id number]  Chief Complaint  Patient presents with   GI Problem    Still having upper abdominal pain anytime she eats or drinks 2-3 hours later she feels nausea. Does have pill to help with nausea. Has been going on for 2 weeks and this is now causing her to be dehydrated.     HPI Patient is in today for abdominal pain that started on 07/01/2022.   Sharp pain in lower right quadrant. The pain then radiated to her right flank.   She took Celebrex.   Lately she has been experiencing nausea with eating and epigastric pain 2-3 hours after eating.  States she feels dehydrated and she has been unable to work.  States she is losing weight.   Evaluated in the ED. Unremarkable CT abdomen pelvis, pelvic US and RUQ [place].  Negative labs including lipase and liver.   She has a hx of endometriosis and was seen by OB/GYN this morning.   Denies fever, chills, dizziness, chest pain, palpitations, shortness of breath, urinary symptoms, LE edema.       Health Maintenance Due  Topic Date Due   HPV VACCINES (1 - 2-dose series) Never done   HIV Screening  Never done   PAP-Cervical Cytology Screening  Never done   PAP SMEAR-Modifier  Never done   COVID-19 Vaccine (3 - 2023-24 season) 02/14/2022    Past Medical History:  Diagnosis Date   Abnormal menstrual periods    Allergy    Endometriosis    Frequent headaches    otc med prn   GERD (gastroesophageal reflux disease)    Urticaria     Past Surgical History:  Procedure Laterality Date   LAPAROSCOPY N/A 02/14/2016   Procedure: LAPAROSCOPY DIAGNOSTIC FULGERATION OF PELVIC ENDOMETRIOSIS;  Surgeon: Servando Salina, MD;  Location: [facility];  Service: Gynecology;  Laterality: N/A;  @ 2hrs.   WISDOM TOOTH EXTRACTION      Family History  Problem Relation Age of Onset   Healthy Mother    Hyperlipidemia Maternal Aunt        x 2   Food Allergy Maternal  Aunt    Heart disease Maternal Grandmother    Hypertension Maternal Grandmother    Hypertension Maternal Grandfather    Colon cancer Neg Hx    Esophageal cancer Neg Hx    Stomach cancer Neg Hx    Pancreatic cancer Neg Hx    Liver disease Neg Hx    Inflammatory bowel disease Neg Hx     Social History   Socioeconomic History   Marital status: Single    Spouse name: Not on file   Number of children: 0   Years of education: Not on file   Highest education level: Not on file  Occupational History   Occupation: unemployed  Tobacco Use   Smoking status: Never   Smokeless tobacco: Never  Vaping Use   Vaping Use: Never used  Substance and Sexual Activity   Alcohol use: Yes   Drug use: No   Sexual activity: Never    Birth control/protection: None  Other Topics Concern   Not on file  Social History Narrative   Not on file   Social Determinants of Health   Financial Resource Strain: Not on file  Food Insecurity: Not on file  Transportation Needs: Not on file  Physical Activity: Not on file  Stress: Not  on file  Social Connections: Not on file  Intimate Partner Violence: Not on file    Outpatient Medications Prior to Visit  Medication Sig Dispense Refill   acetaminophen-codeine (TYLENOL/CODEINE #3) 300-30 MG tablet Take 1-2 tablets by mouth every 6 (six) hours as needed. 20 tablet 1   celecoxib (CELEBREX) 200 MG capsule Take 400 mg today, then 200 mg/d pc 60 capsule 3   Ferrous Fumarate (IRON) 18 MG TBCR      fluticasone (FLONASE) 50 MCG/ACT nasal spray Place 2 sprays into both nostrils daily. 16 g 6   levocetirizine (XYZAL) 5 MG tablet Take 1 tablet (5 mg total) by mouth every evening. 90 tablet 1   Multiple Vitamin (MULTIVITAMIN WITH MINERALS) TABS tablet Take 1 tablet by mouth daily.     ondansetron (ZOFRAN) 4 MG tablet Take 1 tablet (4 mg total) by mouth every 8 (eight) hours as needed for nausea or vomiting. 20 tablet 1   SUMAtriptan (IMITREX) 100 MG tablet Take one  prn migraine. May repeat in 2 hours if headache persists or recurs. 12 tablet 5   famotidine (PEPCID) 20 MG tablet Take 1 tablet (20 mg total) by mouth 2 (two) times daily for 14 days. 28 tablet 0   omeprazole (PRILOSEC) 20 MG capsule Take 1 capsule (20 mg total) by mouth daily for 28 days. 28 capsule 0   No facility-administered medications prior to visit.    Allergies  Allergen Reactions   Latex Other (See Comments)    unknown Other reaction(s): Other (See Comments) unknown    Maxalt [Rizatriptan]     Mild side effects   Omnicef [Cefdinir]     Nausea, diarrhea after 1st week of use   Zithromax [Azithromycin] Other (See Comments)    Face redness and swelling   Penicillins Rash    Has patient had a PCN reaction causing immediate rash, facial/tongue/throat swelling, SOB or lightheadedness with hypotension: Yes Has patient had a PCN reaction causing severe rash involving mucus membranes or skin necrosis: Yes Has patient had a PCN reaction that required hospitalization No Has patient had a PCN reaction occurring within the last 10 years: No If all of the above answers are "NO", then may proceed with Cephalosporin use.     ROS     Objective:    Physical Exam Constitutional:      General: She is not in acute distress.    Appearance: She is not ill-appearing.  Eyes:     Conjunctiva/sclera: Conjunctivae normal.  Cardiovascular:     Rate and Rhythm: Normal rate and regular rhythm.  Pulmonary:     Effort: Pulmonary effort is normal.     Breath sounds: Normal breath sounds.  Abdominal:     General: Bowel sounds are normal. There is no distension.     Palpations: Abdomen is soft.     Tenderness: There is abdominal tenderness in the periumbilical area and left lower quadrant. There is no right CVA tenderness, left CVA tenderness, guarding or rebound. Negative signs include Murphy's sign and McBurney's sign.  Musculoskeletal:     Cervical back: Normal range of motion.  Skin:     General: Skin is warm and dry.  Neurological:     General: No focal deficit present.     Mental Status: She is alert and oriented to person, place, and time.  Psychiatric:        Mood and Affect: Mood normal.        Behavior: Behavior normal.  Thought Content: Thought content normal.     BP 108/72 (BP Location: Left Arm, Patient Position: Sitting, Cuff Size: Large)   Pulse 66   Temp 97.6 F (36.4 C) (Temporal)   Ht 5' 7"$  (1.702 m)   Wt 215 lb (97.5 kg)   LMP 06/09/2022 (Exact Date)   SpO2 97%   BMI 33.67 kg/m  Wt Readings from Last 3 Encounters:  07/25/22 215 lb (97.5 kg)  07/10/22 222 lb 9.6 oz (101 kg)  07/03/22 220 lb (99.8 kg)       Assessment & Plan:   Problem List Items Addressed This Visit       Digestive   GERD (gastroesophageal reflux disease)   Relevant Medications   pantoprazole (PROTONIX) 40 MG tablet   sucralfate (CARAFATE) 1 GM/10ML suspension   Other Relevant Orders   Ambulatory referral to Gastroenterology     Other   Abdominal pain - Primary   Relevant Medications   pantoprazole (PROTONIX) 40 MG tablet   sucralfate (CARAFATE) 1 GM/10ML suspension   Other Relevant Orders   Ambulatory referral to Gastroenterology   Other Visit Diagnoses     Abdominal tenderness of left lower quadrant, rebound tenderness presence not specified       Relevant Orders   Ambulatory referral to Gastroenterology   Nausea       Relevant Medications   pantoprazole (PROTONIX) 40 MG tablet   sucralfate (CARAFATE) 1 GM/10ML suspension   Other Relevant Orders   Ambulatory referral to Gastroenterology   Decreased oral intake       Relevant Orders   Ambulatory referral to Gastroenterology      No red flag symptoms. Reviewed ED notes and results.  Negative CT abd/pelvis, negative RUQ [place], negative pelvic US Discussed that she may have a gastric or duodenal ulcer.  Advised to start pantoprazole 40 mg bid and sucralfate. Avoid spicy, acidic, fried foods or dairy.  Bland diet.  Urgent referral to GI. She has an appt for IV fluids on Monday ordered by OB/GYN She will go to the ED if worsening.  Follow up with PCP in 2 weeks.   I have discontinued [place] B. Colucci's famotidine and omeprazole. I am also having her start on pantoprazole and sucralfate. Additionally, I am having her maintain her multivitamin with minerals, Iron, levocetirizine, fluticasone, SUMAtriptan, celecoxib, acetaminophen-codeine, and ondansetron.  Meds ordered this encounter  Medications   pantoprazole (PROTONIX) 40 MG tablet    Sig: Take 1 tablet (40 mg total) by mouth daily.    Dispense:  30 tablet    Refill:  1    Order Specific Question:   Supervising Provider    Answer:   Pricilla Holm A [[id number]]   sucralfate (CARAFATE) 1 GM/10ML suspension    Sig: Take 10 mLs (1 g total) by mouth 4 (four) times daily -  with meals and at bedtime.    Dispense:  420 mL    Refill:  0    Order Specific Question:   Supervising Provider    Answer:   Pricilla Holm A [id number]

## 2022-07-25 NOTE — Patient Instructions (Signed)
Avoid NSAIDs such as Aleve, ibuprofen, Advil, etc.   Eat a bland diet   Food Choices for Gastroesophageal Reflux Disease, Adult When you have gastroesophageal reflux disease (GERD), the foods you eat and your eating habits are very important. Choosing the right foods can help ease the discomfort of GERD. Consider working with a dietitian to help you make healthy food choices. What are tips for following this plan? Reading food labels Look for foods that are low in saturated fat. Foods that have less than 5% of daily value (DV) of fat and 0 g of trans fats may help with your symptoms. Cooking Cook foods using methods other than frying. This may include baking, steaming, grilling, or broiling. These are all methods that do not need a lot of fat for cooking. To add flavor, try to use herbs that are low in spice and acidity. Meal planning  Choose healthy foods that are low in fat, such as fruits, vegetables, whole grains, low-fat dairy products, lean meats, fish, and poultry. Eat frequent, small meals instead of three large meals each day. Eat your meals slowly, in a relaxed setting. Avoid bending over or lying down until 2-3 hours after eating. Limit high-fat foods such as fatty meats or fried foods. Limit your intake of fatty foods, such as oils, butter, and shortening. Avoid the following as told by your health care provider: Foods that cause symptoms. These may be different for different people. Keep a food diary to keep track of foods that cause symptoms. Alcohol. Drinking large amounts of liquid with meals. Eating meals during the 2-3 hours before bed. Lifestyle Maintain a healthy weight. Ask your health care provider what weight is healthy for you. If you need to lose weight, work with your health care provider to do so safely. Exercise for at least 30 minutes on 5 or more days each week, or as told by your health care provider. Avoid wearing clothes that fit tightly around your  waist and chest. Do not use any products that contain nicotine or tobacco. These products include cigarettes, chewing tobacco, and vaping devices, such as e-cigarettes. If you need help quitting, ask your health care provider. Sleep with the head of your bed raised. Use a wedge under the mattress or blocks under the bed frame to raise the head of the bed. Chew sugar-free gum after mealtimes. What foods should I eat?  Eat a healthy, well-balanced diet of fruits, vegetables, whole grains, low-fat dairy products, lean meats, fish, and poultry. Each person is different. Foods that may trigger symptoms in one person may not trigger any symptoms in another person. Work with your health care provider to identify foods that are safe for you. The items listed above may not be a complete list of recommended foods and beverages. Contact a dietitian for more information. What foods should I avoid? Limiting some of these foods may help manage the symptoms of GERD. Everyone is different. Consult a dietitian or your health care provider to help you identify the exact foods to avoid, if any. Fruits Any fruits prepared with added fat. Any fruits that cause symptoms. For some people this may include citrus fruits, such as oranges, grapefruit, pineapple, and lemons. Vegetables Deep-fried vegetables. [place] fries. Any vegetables prepared with added fat. Any vegetables that cause symptoms. For some people, this may include tomatoes and tomato products, chili peppers, onions and garlic, and horseradish. Grains Pastries or quick breads with added fat. Meats and other proteins High-fat meats, such as  fatty beef or pork, hot dogs, ribs, ham, sausage, salami, and bacon. Fried meat or protein, including fried fish and fried chicken. Nuts and nut butters, in large amounts. Dairy Whole milk and chocolate milk. Sour cream. Cream. Ice cream. Cream cheese. Milkshakes. Fats and oils Butter. Margarine. Shortening.  Ghee. Beverages Coffee and tea, with or without caffeine. Carbonated beverages. Sodas. Energy drinks. Fruit juice made with acidic fruits, such as orange or grapefruit. Tomato juice. Alcoholic drinks. Sweets and desserts Chocolate and cocoa. Donuts. Seasonings and condiments Pepper. Peppermint and spearmint. Added salt. Any condiments, herbs, or seasonings that cause symptoms. For some people, this may include curry, hot sauce, or vinegar-based salad dressings. The items listed above may not be a complete list of foods and beverages to avoid. Contact a dietitian for more information. Questions to ask your health care provider Diet and lifestyle changes are usually the first steps that are taken to manage symptoms of GERD. If diet and lifestyle changes do not improve your symptoms, talk with your health care provider about taking medicines. Where to find more information International Foundation for Gastrointestinal Disorders: aboutgerd.org Summary When you have gastroesophageal reflux disease (GERD), food and lifestyle choices may be very helpful in easing the discomfort of GERD. Eat frequent, small meals instead of three large meals each day. Eat your meals slowly, in a relaxed setting. Avoid bending over or lying down until 2-3 hours after eating. Limit high-fat foods such as fatty meats or fried foods. This information is not intended to replace advice given to you by your health care provider. Make sure you discuss any questions you have with your health care provider. Document Revised: 12/12/2019 Document Reviewed: 12/12/2019 Elsevier Patient Education  [place].

## 2022-07-29 ENCOUNTER — Telehealth: Payer: Self-pay | Admitting: Gastroenterology

## 2022-07-29 ENCOUNTER — Encounter: Payer: Self-pay | Admitting: Internal Medicine

## 2022-07-29 NOTE — Telephone Encounter (Signed)
Noted  

## 2022-07-29 NOTE — Telephone Encounter (Signed)
Inbound call from patient, states she has found care sooner elsewhere. Appointment with [place] was cancelled.

## 2022-07-29 NOTE — Telephone Encounter (Signed)
Urgent referral in [place] from PCP.  Patient states she is having severe abd pain, nausea, unable to eat or drink and has been seen in ED several times.  She is scheduled at another office 09/2022 however her PCP is requesting sooner appt.  Please advise?  Thanks  Dr. Tarri Glenn supervising MD 2/13/24am

## 2022-08-05 DIAGNOSIS — K625 Hemorrhage of anus and rectum: Secondary | ICD-10-CM | POA: Diagnosis not present

## 2022-08-05 DIAGNOSIS — R1031 Right lower quadrant pain: Secondary | ICD-10-CM | POA: Diagnosis not present

## 2022-08-05 DIAGNOSIS — R1013 Epigastric pain: Secondary | ICD-10-CM | POA: Diagnosis not present

## 2022-08-06 DIAGNOSIS — R1013 Epigastric pain: Secondary | ICD-10-CM | POA: Diagnosis not present

## 2022-08-06 DIAGNOSIS — K625 Hemorrhage of anus and rectum: Secondary | ICD-10-CM | POA: Diagnosis not present

## 2022-08-06 DIAGNOSIS — R1031 Right lower quadrant pain: Secondary | ICD-10-CM | POA: Diagnosis not present

## 2022-08-07 ENCOUNTER — Other Ambulatory Visit: Payer: Self-pay

## 2022-08-07 ENCOUNTER — Encounter
Admission: RE | Admit: 2022-08-07 | Discharge: 2022-08-07 | Disposition: A | Discharge status: Home / Self Care | Payer: 59 | Source: Ambulatory Visit | Attending: Obstetrics and Gynecology | Admitting: Obstetrics and Gynecology

## 2022-08-07 HISTORY — DX: Anemia, unspecified: D64.9

## 2022-08-07 NOTE — H&P (Signed)
Hannah Ford is a 27 y.o. female here for ED follow up (Was told to follow up by ED for endometriosis, dx lap in 2017 - was supposed to be on OCP's but they were making her sick, last few periods have gotten heavier but felt better off the current ocp's )   Referring provider: [facility] [place]*   History of Present Illness: New pt returns today for ED f/u visit. She presented to the ED 07/03/22 for pelvic pain ongoing x 36 hours.    Today:    Location:  uterus Quality:  dysmenorrhea Severity:  moderate-severe Duration:  1 year and worsening  Timing:  cyclic Context:   She has known endometriosis, dx by surgery in 2017, she has been taking birth control to help manage her pain, but she has not been taking this for awhile now. She has stopped for about 5 years now and her periods have become painful & heavy again.    With contraception, her periods became more tolerable in flow and pain level.    In 04/2022 she started to bleed through her heavy maxi pads. Her pain has also worsened. She feels pain in her vagina. During her period, she will be in her bed.    She also has postprandial pain and bloating in her upper abdomen.  She is constipated and has not had a bowel movement in at least a week. She did a suppository and stool had some blood in it.   Modifying factors:  nothing makes better or worse Associated signs and symptoms:  +constipation, +menorrhagia, +vaginal pain, +hip pressure       TAUS 07/03/22 Uterus 7.4 x 3 x 4.4 cm No fibroids or other mass visualized. Endometrium  8 mm.  No focal abnormality visualized.  Right ovary 3.3 x 1.7 x 2.4 cm = volume: 7 mL. Normal appearance/no adnexal mass.  Left ovary Not visualized.  Other: None.   IMPRESSION:  1. Unremarkable pelvic ultrasound on this transabdominal only study.  2. The left ovary not visualized.    Pertinent Hx: -Endometriosis dx by laparoscopy in 2017 with [facility]    - Hx of OCPs for endo  control and dysmenorrhea, she had "mood changes" and some fatigue and stopped in about 2020.    Past Medical History:  has a past medical history of Anemia, Endometriosis of uterus (August 2017), GERD (gastroesophageal reflux disease), and Migraine headache.  Past Surgical History:  has a past surgical history that includes Pelvic laparoscopy (2017). Family History: family history includes Colon cancer in her maternal grandmother; Coronary Artery Disease (Blocked arteries around heart) in her maternal grandmother; Diabetes in her maternal grandmother; Endometriosis in her mother; High blood pressure (Hypertension) in her maternal grandmother and mother; Myocardial Infarction (Heart attack) in her maternal grandmother. Social History:  reports that she has never smoked. She has never used smokeless tobacco. She reports that she does not drink alcohol and does not use drugs. OB/GYN History:  OB History       Gravida 0   Para 0   Term 0   Preterm 0   AB 0   Living 0       SAB 0   IAB 0   Ectopic 0   Molar 0   Multiple 0   Live Births 0        Allergies: is allergic to zithromax [azithromycin], cefdinir, latex, penicillin, and rizatriptan. Medications: Current Outpatient Medications:    cholecalciferol (CHOLECALCIFEROL) 1,000 unit tablet, Take by mouth. (  Patient not taking: Reported on 07/03/2022), Disp: , Rfl:    cyanocobalamin (B-12 DOTS) 500 MCG tablet, Take 500 mcg by mouth once daily. (Patient not taking: Reported on 07/03/2022), Disp: , Rfl:    famotidine (PEPCID) 20 MG tablet, Take 20 mg by mouth 2 (two) times daily (Patient not taking: Reported on 07/21/2022), Disp: , Rfl:    multivitamin tablet, Take 1 tablet by mouth once daily. (Patient not taking: Reported on 07/16/2022), Disp: , Rfl:    omeprazole (PRILOSEC) 20 MG DR capsule, Take 20 mg by mouth once daily. (Patient not taking: Reported on 07/03/2022), Disp: , Rfl:    SUMAtriptan (IMITREX) 100 MG tablet,  Take one prn migraine. May repeat in 2 hours if headache persists or recurs. (Patient not taking: Reported on 07/16/2022), Disp: , Rfl:    TRI-LEGEST FE 1-20(5)/1-30(7) /37m-35mcg (9) Tablet, TAKE 1 (ONE) TABLET DAILY, CONTINUOUS (Patient not taking: Reported on 07/03/2022), Disp: , Rfl:    Review of Systems: No SOB, no palpitations or chest pain, no new lower extremity edema, no nausea or vomiting or bowel or bladder complaints. See HPI for gyn specific ROS.    Exam:   BP 122/74   Ht 167.6 cm (5' 6"$ )   Wt 97.9 kg (215 lb 12.8 oz)   LMP 07/14/2022 (Approximate)   BMI 34.83 kg/m     Constitutional:  General appearance: Well nourished, well developed female in no acute distress.  Neuro/psych:  Normal mood and affect. No gross motor deficits. Neck:  Supple, normal appearance.  Respiratory:  Normal respiratory effort, no use of accessory muscles Skin:  No visible rashes or external lesions       Impression:   The primary encounter diagnosis was Endometriosis. Diagnoses of Acute constipation, Primary dysmenorrhea, Excessive or frequent menstruation, Postprandial epigastric pain, and Dehydration were also pertinent to this visit.   Plan:   - Menorrhagia, Dysmenorrhea:  -Discussion of pathophysiology today. We discussed treatment with scheduled NSAIDs, gabapentin & hormonal contraception. OCPs are often a first line therapy, with third-generation progestins providing the biggest benefit (desogestrel and gestondene). Continuous or extended use pills may also help, but cycle selection is determined by patient preference. Progestin-only pills have fewer studies, but are likely effective. DepoProvera is likely also as effective as POP, [place] with aygestin add back. In studies, levonorgestrel IUD provides significant reduction in dysmenorrhea, particularly in endometriosis patients. Implantable devices also provide relief from baseline. Hormonal contraception also provides the benefit of decreased  menorrhagia.    - Endometriosis: visceral syndrome of seven symptoms associated with endometriosis that included abdominal pain with no relation to menstruation, pain during urination, pain during defecation, constipation or diarrhea, irregular bleeding, nausea or vomiting, and feeling tired or lacking energy     She did have some relief following surgery for an extended time. She is interested in another surgery with IUD placement.    Will plan for RA Laparoscopy with LOA, Excision of endometriosis and an exam under anesthesia with IUD insertion: Mirena.    - Constipation, postprandial pain, sx dehydration  We discussed it is possible her BMs are being affected by her endometriosis.  Referral to GI, very important to have normal & consistent bowel movements Bowel recipe given, will get IV fluids for hydration based on UA      Diagnoses and all orders for this visit:   Endometriosis   Acute constipation -     Ambulatory Referral to Gastroenterology   Primary dysmenorrhea   Excessive or frequent menstruation -  CBC w/auto Differential (5 Part)   Postprandial epigastric pain -     Ambulatory Referral to Gastroenterology   Dehydration -     Urinalysis w/Microscopic -     Urine Culture, Routine - [facility]

## 2022-08-07 NOTE — Patient Instructions (Addendum)
Your procedure is scheduled on: 08/15/22 - Friday Report to the Registration Desk on the 1st floor of the [facility]. To find out your arrival time, please call [phone] between 1PM - 3PM on: 08/14/22 - Thursday If your arrival time is 6:00 am, do not arrive before that time as the [facility] entrance doors do not open until 6:00 am.  REMEMBER: Instructions that are not followed completely may result in serious medical risk, up to and including death; or upon the discretion of your surgeon and anesthesiologist your surgery may need to be rescheduled.  Do not eat food after midnight the night before surgery.  No gum chewing or hard candies.  You may however, drink CLEAR liquids up to 2 hours before you are scheduled to arrive for your surgery. Do not drink anything within 2 hours of your scheduled arrival time.  Clear liquids include: - water  - apple juice without pulp - gatorade (not RED colors) - black coffee or tea (Do NOT add milk or creamers to the coffee or tea) Do NOT drink anything that is not on this list.  One week prior to surgery: Stop Anti-inflammatories (NSAIDS) such as Advil, Aleve, Ibuprofen, Motrin, Naproxen, Naprosyn and Aspirin based products such as Excedrin, Goody's Powder, BC Powder.  Stop ANY OVER THE COUNTER supplements until after surgery.  You may take Tylenol if needed for pain up until the day of surgery.   TAKE ONLY THESE MEDICATIONS THE MORNING OF SURGERY WITH A SIP OF WATER:  pantoprazole (PROTONIX) - (take one the night before and one on the morning of surgery - helps to prevent nausea after surgery.)   No Alcohol for 24 hours before or after surgery.  No Smoking including e-cigarettes for 24 hours before surgery.  No chewable tobacco products for at least 6 hours before surgery.  No nicotine patches on the day of surgery.  Do not use any "recreational" drugs for at least a week (preferably 2 weeks) before your surgery.  Please be  advised that the combination of cocaine and anesthesia may have negative outcomes, up to and including death. If you test positive for cocaine, your surgery will be cancelled.  On the morning of surgery brush your teeth with toothpaste and water, you may rinse your mouth with mouthwash if you wish. Do not swallow any toothpaste or mouthwash.  Use CHG Soap or wipes as directed on instruction sheet.  Do not wear jewelry, make-up, hairpins, clips or nail polish.  Do not wear lotions, powders, or perfumes.   Do not shave body hair from the neck down 48 hours before surgery.  Contact lenses, hearing aids and dentures may not be worn into surgery.  Do not bring valuables to the hospital. [facility] is not responsible for any missing/lost belongings or valuables.   Notify your doctor if there is any change in your medical condition (cold, fever, infection).  Wear comfortable clothing (specific to your surgery type) to the hospital.  After surgery, you can help prevent lung complications by doing breathing exercises.  Take deep breaths and cough every 1-2 hours. Your doctor may order a device called an Incentive Spirometer to help you take deep breaths. When coughing or sneezing, hold a pillow firmly against your incision with both hands. This is called "splinting." Doing this helps protect your incision. It also decreases belly discomfort.  If you are being admitted to the hospital overnight, leave your suitcase in the car. After surgery it may be brought  to your room.  In case of increased patient census, it may be necessary for you, the patient, to continue your postoperative care in the Same Day Surgery department.  If you are being discharged the day of surgery, you will not be allowed to drive home. You will need a responsible individual to drive you home and stay with you for 24 hours after surgery.   If you are taking public transportation, you will need to have a responsible  individual with you.  Please call the [facility]. at [phone] if you have any questions about these instructions.  Surgery Visitation Policy:  Patients undergoing a surgery or procedure may have two family members or support persons with them as long as the person is not COVID-19 positive or experiencing its symptoms.   Inpatient Visitation:    Visiting hours are 7 a.m. to 8 p.m. Up to four visitors are allowed at one time in a patient room. The visitors may rotate out with other people during the day. One designated support person (adult) may remain overnight.  Due to an increase in RSV and influenza rates and associated hospitalizations, children ages 32 and under will not be able to visit patients in [facility].  Masks continue to be strongly recommended.     Preparing for Surgery with CHLORHEXIDINE GLUCONATE (CHG) Soap  Chlorhexidine Gluconate (CHG) Soap  o An antiseptic cleaner that kills germs and bonds with the skin to continue killing germs even after washing  o Used for showering the night before surgery and morning of surgery  Before surgery, you can play an important role by reducing the number of germs on your skin.  CHG (Chlorhexidine gluconate) soap is an antiseptic cleanser which kills germs and bonds with the skin to continue killing germs even after washing.  Please do not use if you have an allergy to CHG or antibacterial soaps. If your skin becomes reddened/irritated stop using the CHG.  1. Shower the NIGHT BEFORE SURGERY and the MORNING OF SURGERY with CHG soap.  2. If you choose to wash your hair, wash your hair first as usual with your normal shampoo.  3. After shampooing, rinse your hair and body thoroughly to remove the shampoo.  4. Use CHG as you would any other liquid soap. You can apply CHG directly to the skin and wash gently with a scrungie or a clean washcloth.  5. Apply the CHG soap to your body only from the  neck down. Do not use on open wounds or open sores. Avoid contact with your eyes, ears, mouth, and genitals (private parts). Wash face and genitals (private parts) with your normal soap.  6. Wash thoroughly, paying special attention to the area where your surgery will be performed.  7. Thoroughly rinse your body with warm water.  8. Do not shower/wash with your normal soap after using and rinsing off the CHG soap.  9. Pat yourself dry with a clean towel.  10. Wear clean pajamas to bed the night before surgery.  12. Place clean sheets on your bed the night of your first shower and do not sleep with pets.  13. Shower again with the CHG soap on the day of surgery prior to arriving at the hospital.  14. Do not apply any deodorants/lotions/powders.  15. Please wear clean clothes to the hospital.

## 2022-08-13 ENCOUNTER — Ambulatory Visit: Payer: 59 | Admitting: Internal Medicine

## 2022-08-14 MED ORDER — LACTATED RINGERS IV SOLN: INTRAVENOUS | Status: DC

## 2022-08-14 MED ORDER — POVIDONE-IODINE 10 % EX SWAB: 2.0000 | Freq: Once | CUTANEOUS | Status: DC

## 2022-08-14 MED ORDER — CHLORHEXIDINE GLUCONATE 0.12 % MT SOLN: 15.0000 mL | Freq: Once | OROMUCOSAL | Status: DC

## 2022-08-14 MED ORDER — ORAL CARE MOUTH RINSE: 15.0000 mL | Freq: Once | OROMUCOSAL | Status: DC

## 2022-08-15 ENCOUNTER — Encounter
Admission: RE | Disposition: A | Discharge status: Home / Self Care | Payer: Self-pay | Source: Home / Self Care | Attending: Obstetrics and Gynecology

## 2022-08-15 ENCOUNTER — Ambulatory Visit: Payer: 59 | Admitting: Anesthesiology

## 2022-08-15 ENCOUNTER — Encounter: Payer: Self-pay | Admitting: Obstetrics and Gynecology

## 2022-08-15 ENCOUNTER — Other Ambulatory Visit: Payer: Self-pay

## 2022-08-15 ENCOUNTER — Ambulatory Visit
Admission: RE | Admit: 2022-08-15 | Discharge: 2022-08-15 | Disposition: A | Discharge status: Home / Self Care | Payer: 59 | Attending: Obstetrics and Gynecology | Admitting: Obstetrics and Gynecology

## 2022-08-15 DIAGNOSIS — E860 Dehydration: Secondary | ICD-10-CM | POA: Diagnosis not present

## 2022-08-15 DIAGNOSIS — N8000 Endometriosis of the uterus, unspecified: Secondary | ICD-10-CM | POA: Diagnosis not present

## 2022-08-15 DIAGNOSIS — R102 Pelvic and perineal pain: Secondary | ICD-10-CM | POA: Diagnosis not present

## 2022-08-15 DIAGNOSIS — N946 Dysmenorrhea, unspecified: Secondary | ICD-10-CM | POA: Insufficient documentation

## 2022-08-15 DIAGNOSIS — N80101 Endometriosis of right ovary, unspecified depth: Secondary | ICD-10-CM | POA: Diagnosis not present

## 2022-08-15 DIAGNOSIS — R1030 Lower abdominal pain, unspecified: Secondary | ICD-10-CM

## 2022-08-15 DIAGNOSIS — G709 Myoneural disorder, unspecified: Secondary | ICD-10-CM | POA: Insufficient documentation

## 2022-08-15 DIAGNOSIS — N920 Excessive and frequent menstruation with regular cycle: Secondary | ICD-10-CM | POA: Insufficient documentation

## 2022-08-15 DIAGNOSIS — K219 Gastro-esophageal reflux disease without esophagitis: Secondary | ICD-10-CM | POA: Insufficient documentation

## 2022-08-15 DIAGNOSIS — K5909 Other constipation: Secondary | ICD-10-CM | POA: Diagnosis not present

## 2022-08-15 DIAGNOSIS — N809 Endometriosis, unspecified: Secondary | ICD-10-CM | POA: Insufficient documentation

## 2022-08-15 DIAGNOSIS — Z79899 Other long term (current) drug therapy: Secondary | ICD-10-CM | POA: Diagnosis not present

## 2022-08-15 DIAGNOSIS — D649 Anemia, unspecified: Secondary | ICD-10-CM | POA: Diagnosis not present

## 2022-08-15 DIAGNOSIS — N80329 Endometriosis of the posterior cul-de-sac, unspecified depth: Secondary | ICD-10-CM | POA: Diagnosis not present

## 2022-08-15 DIAGNOSIS — G8929 Other chronic pain: Secondary | ICD-10-CM | POA: Diagnosis not present

## 2022-08-15 HISTORY — PX: ROBOTIC ASSISTED LAPAROSCOPIC LYSIS OF ADHESION: SHX6080

## 2022-08-15 HISTORY — PX: INTRAUTERINE DEVICE (IUD) INSERTION: SHX5877

## 2022-08-15 HISTORY — PX: XI ROBOT ASSISTED DIAGNOSTIC LAPAROSCOPY: SHX6815

## 2022-08-15 LAB — POCT PREGNANCY, URINE: Preg Test, Ur: NEGATIVE

## 2022-08-15 SURGERY — LAPAROSCOPY, DIAGNOSTIC, ROBOT-ASSISTED
Anesthesia: General

## 2022-08-15 MED ORDER — LEVONORGESTREL 20 MCG/DAY IU IUD
INTRAUTERINE_SYSTEM | INTRAUTERINE | Status: AC
  Filled 2022-08-15: qty 1

## 2022-08-15 MED ORDER — EPHEDRINE SULFATE (PRESSORS) 50 MG/ML IJ SOLN
INTRAMUSCULAR | Status: DC | PRN
  Administered 2022-08-15: 10 mg via INTRAVENOUS

## 2022-08-15 MED ORDER — BUPIVACAINE HCL (PF) 0.5 % IJ SOLN
INTRAMUSCULAR | Status: DC | PRN
  Administered 2022-08-15: 30 mL

## 2022-08-15 MED ORDER — ONDANSETRON HCL 4 MG/2ML IJ SOLN
INTRAMUSCULAR | Status: AC
  Filled 2022-08-15: qty 2

## 2022-08-15 MED ORDER — SUGAMMADEX SODIUM 200 MG/2ML IV SOLN
INTRAVENOUS | Status: DC | PRN
  Administered 2022-08-15: 200 mg via INTRAVENOUS

## 2022-08-15 MED ORDER — ONDANSETRON HCL 4 MG/2ML IJ SOLN
INTRAMUSCULAR | Status: DC | PRN
  Administered 2022-08-15: 4 mg via INTRAVENOUS

## 2022-08-15 MED ORDER — PROPOFOL 10 MG/ML IV BOLUS
INTRAVENOUS | Status: AC
  Filled 2022-08-15: qty 20

## 2022-08-15 MED ORDER — OXYCODONE HCL 5 MG/5ML PO SOLN: 5.0000 mg | Freq: Once | ORAL | Status: AC | PRN

## 2022-08-15 MED ORDER — DEXMEDETOMIDINE HCL IN NACL 80 MCG/20ML IV SOLN
INTRAVENOUS | Status: DC | PRN
  Administered 2022-08-15: 8 ug via BUCCAL

## 2022-08-15 MED ORDER — MIDAZOLAM HCL 2 MG/2ML IJ SOLN
INTRAMUSCULAR | Status: AC
  Filled 2022-08-15: qty 2

## 2022-08-15 MED ORDER — HEMOSTATIC AGENTS (NO CHARGE) OPTIME
TOPICAL | Status: DC | PRN
  Administered 2022-08-15: 1 via TOPICAL

## 2022-08-15 MED ORDER — LEVONORGESTREL 20 MCG/DAY IU IUD
1.0000 | INTRAUTERINE_SYSTEM | Freq: Once | INTRAUTERINE | Status: AC
  Administered 2022-08-15: 1 via INTRAUTERINE
  Filled 2022-08-15: qty 1

## 2022-08-15 MED ORDER — HYDROMORPHONE HCL 1 MG/ML IJ SOLN
0.2500 mg | INTRAMUSCULAR | Status: DC | PRN
  Administered 2022-08-15 (×3): 0.5 mg via INTRAVENOUS

## 2022-08-15 MED ORDER — DEXAMETHASONE SODIUM PHOSPHATE 10 MG/ML IJ SOLN
INTRAMUSCULAR | Status: DC | PRN
  Administered 2022-08-15: 10 mg via INTRAVENOUS

## 2022-08-15 MED ORDER — DOCUSATE SODIUM 100 MG PO CAPS: 100.0000 mg | ORAL_CAPSULE | Freq: Two times a day (BID) | ORAL | 0 refills | Status: DC

## 2022-08-15 MED ORDER — BUPIVACAINE HCL (PF) 0.5 % IJ SOLN
INTRAMUSCULAR | Status: AC
  Filled 2022-08-15: qty 30

## 2022-08-15 MED ORDER — GABAPENTIN 800 MG PO TABS: 800.0000 mg | ORAL_TABLET | Freq: Every day | ORAL | 0 refills | Status: DC

## 2022-08-15 MED ORDER — KETOROLAC TROMETHAMINE 30 MG/ML IJ SOLN
INTRAMUSCULAR | Status: DC | PRN
  Administered 2022-08-15: 30 mg via INTRAVENOUS

## 2022-08-15 MED ORDER — ROCURONIUM BROMIDE 10 MG/ML (PF) SYRINGE
PREFILLED_SYRINGE | INTRAVENOUS | Status: AC
  Filled 2022-08-15: qty 10

## 2022-08-15 MED ORDER — EPHEDRINE 5 MG/ML INJ
INTRAVENOUS | Status: AC
  Filled 2022-08-15: qty 5

## 2022-08-15 MED ORDER — LIDOCAINE HCL (PF) 2 % IJ SOLN
INTRAMUSCULAR | Status: AC
  Filled 2022-08-15: qty 5

## 2022-08-15 MED ORDER — PHENYLEPHRINE 80 MCG/ML (10ML) SYRINGE FOR IV PUSH (FOR BLOOD PRESSURE SUPPORT)
PREFILLED_SYRINGE | INTRAVENOUS | Status: AC
  Filled 2022-08-15: qty 10

## 2022-08-15 MED ORDER — PROPOFOL 10 MG/ML IV BOLUS
INTRAVENOUS | Status: DC | PRN
  Administered 2022-08-15: 200 mg via INTRAVENOUS

## 2022-08-15 MED ORDER — OXYCODONE HCL 5 MG PO TABS: 5.0000 mg | ORAL_TABLET | ORAL | 0 refills | Status: DC | PRN

## 2022-08-15 MED ORDER — KETOROLAC TROMETHAMINE 30 MG/ML IJ SOLN
INTRAMUSCULAR | Status: AC
  Filled 2022-08-15: qty 1

## 2022-08-15 MED ORDER — FENTANYL CITRATE (PF) 100 MCG/2ML IJ SOLN
INTRAMUSCULAR | Status: DC | PRN
  Administered 2022-08-15 (×2): 50 ug via INTRAVENOUS

## 2022-08-15 MED ORDER — IBUPROFEN 800 MG PO TABS: 800.0000 mg | ORAL_TABLET | Freq: Three times a day (TID) | ORAL | 1 refills | Status: AC

## 2022-08-15 MED ORDER — ACETAMINOPHEN EXTRA STRENGTH 500 MG PO TABS: 1000.0000 mg | ORAL_TABLET | Freq: Four times a day (QID) | ORAL | 0 refills | Status: AC

## 2022-08-15 MED ORDER — PHENYLEPHRINE HCL (PRESSORS) 10 MG/ML IV SOLN
INTRAVENOUS | Status: DC | PRN
  Administered 2022-08-15: 80 ug via INTRAVENOUS

## 2022-08-15 MED ORDER — LIDOCAINE HCL (CARDIAC) PF 100 MG/5ML IV SOSY
PREFILLED_SYRINGE | INTRAVENOUS | Status: DC | PRN
  Administered 2022-08-15: 100 mg via INTRAVENOUS

## 2022-08-15 MED ORDER — ROCURONIUM BROMIDE 100 MG/10ML IV SOLN
INTRAVENOUS | Status: DC | PRN
  Administered 2022-08-15: 20 mg via INTRAVENOUS
  Administered 2022-08-15: 50 mg via INTRAVENOUS

## 2022-08-15 MED ORDER — OXYCODONE HCL 5 MG PO TABS
5.0000 mg | ORAL_TABLET | Freq: Once | ORAL | Status: AC | PRN
  Administered 2022-08-15: 5 mg via ORAL

## 2022-08-15 MED ORDER — ACETAMINOPHEN 10 MG/ML IV SOLN
INTRAVENOUS | Status: DC | PRN
  Administered 2022-08-15: 1000 mg via INTRAVENOUS

## 2022-08-15 MED ORDER — ACETAMINOPHEN 10 MG/ML IV SOLN
INTRAVENOUS | Status: AC
  Filled 2022-08-15: qty 100

## 2022-08-15 MED ORDER — DEXAMETHASONE SODIUM PHOSPHATE 10 MG/ML IJ SOLN
INTRAMUSCULAR | Status: AC
  Filled 2022-08-15: qty 1

## 2022-08-15 MED ORDER — OXYCODONE HCL 5 MG PO TABS
ORAL_TABLET | ORAL | Status: AC
  Filled 2022-08-15: qty 1

## 2022-08-15 MED ORDER — FENTANYL CITRATE (PF) 100 MCG/2ML IJ SOLN
INTRAMUSCULAR | Status: AC
  Filled 2022-08-15: qty 2

## 2022-08-15 MED ORDER — CHLORHEXIDINE GLUCONATE 0.12 % MT SOLN
OROMUCOSAL | Status: AC
  Filled 2022-08-15: qty 15

## 2022-08-15 MED ORDER — SILVER NITRATE-POT NITRATE 75-25 % EX MISC: CUTANEOUS | Status: DC | PRN

## 2022-08-15 MED ORDER — MIDAZOLAM HCL 2 MG/2ML IJ SOLN
INTRAMUSCULAR | Status: DC | PRN
  Administered 2022-08-15: 2 mg via INTRAVENOUS

## 2022-08-15 MED ORDER — HYDROMORPHONE HCL 1 MG/ML IJ SOLN
INTRAMUSCULAR | Status: AC
  Filled 2022-08-15: qty 1

## 2022-08-15 MED ORDER — HYDROMORPHONE HCL 1 MG/ML IJ SOLN
INTRAMUSCULAR | Status: AC
  Administered 2022-08-15: 0.5 mg via INTRAVENOUS
  Filled 2022-08-15: qty 1

## 2022-08-15 SURGICAL SUPPLY — 70 items
ADH SKN CLS APL DERMABOND .7 (GAUZE/BANDAGES/DRESSINGS) ×2
APL SRG 38 LTWT LNG FL B (MISCELLANEOUS) ×2
APPLICATOR ARISTA FLEXITIP XL (MISCELLANEOUS) IMPLANT
BAG DRN RND TRDRP ANRFLXCHMBR (UROLOGICAL SUPPLIES) ×2
BAG URINE DRAIN 2000ML AR STRL (UROLOGICAL SUPPLIES) ×2 IMPLANT
BLADE SURG SZ11 CARB STEEL (BLADE) ×2 IMPLANT
CATH FOLEY 2WAY  5CC 16FR (CATHETERS) ×2
CATH FOLEY 2WAY 5CC 16FR (CATHETERS) ×2
CATH URTH 16FR FL 2W BLN LF (CATHETERS) ×2 IMPLANT
COUNTER NEEDLE 20/40 LG (NEEDLE) ×2 IMPLANT
COVER TIP SHEARS 8 DVNC (MISCELLANEOUS) ×2 IMPLANT
COVER TIP SHEARS 8MM DA VINCI (MISCELLANEOUS) ×2
COVER WAND RF STERILE (DRAPES) ×2 IMPLANT
DERMABOND ADVANCED .7 DNX12 (GAUZE/BANDAGES/DRESSINGS) ×2 IMPLANT
DRAPE 3/4 80X56 (DRAPES) ×2 IMPLANT
DRAPE ARM DVNC X/XI (DISPOSABLE) ×6 IMPLANT
DRAPE COLUMN DVNC XI (DISPOSABLE) ×3 IMPLANT
DRAPE DA VINCI XI ARM (DISPOSABLE) ×6
DRAPE DA VINCI XI COLUMN (DISPOSABLE) ×2
DRAPE ROBOT W/ LEGGING 30X125 (DRAPES) ×2 IMPLANT
DRSG TELFA 3X8 NADH STRL (GAUZE/BANDAGES/DRESSINGS) IMPLANT
ELECT REM PT RETURN 9FT ADLT (ELECTROSURGICAL) ×2
ELECTRODE REM PT RTRN 9FT ADLT (ELECTROSURGICAL) ×2 IMPLANT
GLOVE BIO SURGEON STRL SZ7 (GLOVE) ×8 IMPLANT
GLOVE SURG UNDER LTX SZ7.5 (GLOVE) ×8 IMPLANT
GOWN STRL REUS W/ TWL LRG LVL3 (GOWN DISPOSABLE) ×8 IMPLANT
GOWN STRL REUS W/TWL LRG LVL3 (GOWN DISPOSABLE) ×8
GRASPER SUT TROCAR 14GX15 (MISCELLANEOUS) ×2 IMPLANT
HEMOSTAT ARISTA ABSORB 1G (HEMOSTASIS) IMPLANT
HEMOSTAT ARISTA ABSORB 3G PWDR (HEMOSTASIS) IMPLANT
IRRIGATION STRYKERFLOW (MISCELLANEOUS) IMPLANT
IRRIGATOR STRYKERFLOW (MISCELLANEOUS)
IV NS 1000ML (IV SOLUTION)
IV NS 1000ML BAXH (IV SOLUTION) IMPLANT
KIT PINK PAD W/HEAD ARE REST (MISCELLANEOUS) ×2
KIT PINK PAD W/HEAD ARM REST (MISCELLANEOUS) ×2 IMPLANT
KIT TURNOVER CYSTO (KITS) ×2 IMPLANT
LABEL OR SOLS (LABEL) ×2 IMPLANT
MANIFOLD NEPTUNE II (INSTRUMENTS) ×2 IMPLANT
MANIPULATOR UTERINE 4.5 ZUMI (MISCELLANEOUS) ×2 IMPLANT
NS IRRIG 1000ML POUR BTL (IV SOLUTION) ×3 IMPLANT
OBTURATOR OPTICAL STANDARD 8MM (TROCAR) ×2
OBTURATOR OPTICAL STND 8 DVNC (TROCAR) ×2
OBTURATOR OPTICALSTD 8 DVNC (TROCAR) ×2 IMPLANT
PACK DNC HYST (MISCELLANEOUS) ×2 IMPLANT
PACK GYN LAPAROSCOPIC (MISCELLANEOUS) ×3 IMPLANT
PAD OB MATERNITY 4.3X12.25 (PERSONAL CARE ITEMS) ×2 IMPLANT
PAD PREP 24X41 OB/GYN DISP (PERSONAL CARE ITEMS) ×2 IMPLANT
SCRUB CHG 4% DYNA-HEX 4OZ (MISCELLANEOUS) ×2 IMPLANT
SEAL CANN UNIV 5-8 DVNC XI (MISCELLANEOUS) ×6 IMPLANT
SEAL XI 5MM-8MM UNIVERSAL (MISCELLANEOUS) ×6
SEALER VESSEL DA VINCI XI (MISCELLANEOUS)
SEALER VESSEL EXT DVNC XI (MISCELLANEOUS) IMPLANT
SET CYSTO W/LG BORE CLAMP LF (SET/KITS/TRAYS/PACK) IMPLANT
SET TUBE SMOKE EVAC HIGH FLOW (TUBING) ×2 IMPLANT
SOL ELECTROSURG ANTI STICK (MISCELLANEOUS) ×2
SOL PREP PVP 2OZ (MISCELLANEOUS) ×2
SOLUTION ELECTROSURG ANTI STCK (MISCELLANEOUS) ×2 IMPLANT
SOLUTION PREP PVP 2OZ (MISCELLANEOUS) ×3 IMPLANT
SURGILUBE 2OZ TUBE FLIPTOP (MISCELLANEOUS) ×2 IMPLANT
SUT MNCRL 4-0 (SUTURE) ×2
SUT MNCRL 4-0 27XMFL (SUTURE) ×2
SUT VIC AB 0 CT2 27 (SUTURE) ×4 IMPLANT
SUT VIC AB 2-0 SH 27 (SUTURE) ×2
SUT VIC AB 2-0 SH 27XBRD (SUTURE) IMPLANT
SUT VLOC 90 2/L VL 12 GS22 (SUTURE) IMPLANT
SUTURE MNCRL 4-0 27XMF (SUTURE) ×2 IMPLANT
TOWEL OR 17X26 4PK STRL BLUE (TOWEL DISPOSABLE) ×2 IMPLANT
TRAP FLUID SMOKE EVACUATOR (MISCELLANEOUS) ×2 IMPLANT
WATER STERILE IRR 500ML POUR (IV SOLUTION) ×2 IMPLANT

## 2022-08-15 NOTE — Discharge Instructions (Addendum)
AMBULATORY SURGERY  DISCHARGE INSTRUCTIONS   The drugs that you were given will stay in your system until tomorrow so for the next 24 hours you should not:  Drive an automobile Make any legal decisions Drink any alcoholic beverage   You may resume regular meals tomorrow.  Today it is better to start with liquids and gradually work up to solid foods.  You may eat anything you prefer, but it is better to start with liquids, then soup and crackers, and gradually work up to solid foods.   Please notify your doctor immediately if you have any unusual bleeding, trouble breathing, redness and pain at the surgery site, drainage, fever, or pain not relieved by medication.    Additional Instructions:        Please contact your physician with any problems or Same Day Surgery at [phone], Monday through Friday 6 am to 4 pm, or [place] at [facility] number at [phone].Laparoscopic Ovarian Surgery Discharge Instructions  For the next three days, take ibuprofen and acetaminophen on a schedule, every 8 hours. You can take them together or you can intersperse them, and take one every four hours. I also gave you gabapentin for nighttime, to help you sleep and also to control pain. Take gabapentin medicines at night for at least the next 3 nights. You also have a narcotic, oxycodone, to take as needed if the above medicines don't help.  Postop constipation is a major cause of pain. Stay well hydrated, walk as you tolerate, and take over the counter senna as well as stool softeners if you need them.   RISKS AND COMPLICATIONS  Infection. Bleeding. Injury to surrounding organs. Anesthetic side effects.   PROCEDURE  You may be given a medicine to help you relax (sedative) before the procedure. You will be given a medicine to make you sleep (general anesthetic) during the procedure. A tube will be put down your throat to help your breath while under general anesthesia. Several  small cuts (incisions) are made in the lower abdominal area and one incision is made near the belly button. Your abdominal area will be inflated with a safe gas (carbon dioxide). This helps give the surgeon room to operate, visualize, and helps the surgeon avoid other organs. A thin, lighted tube (laparoscope) with a camera attached is inserted into your abdomen through the incision near the belly button. Other small instruments may also be inserted through other abdominal incisions. The ovary is located and are removed. After the ovary is removed, the gas is released from the abdomen. The incisions will be closed with stitches (sutures), and Dermabond. A bandage may be placed over the incisions.  AFTER THE PROCEDURE  You will also have some mild abdominal discomfort for 3-7 days. You will be given pain medicine to ease any discomfort. As long as there are no problems, you may be allowed to go home. Someone will need to drive you home and be with you for at least 24 hours once home. You may have some mild discomfort in the throat. This is from the tube placed in your throat while you were sleeping. You may experience discomfort in the shoulder area from some trapped air between the liver and diaphragm. This sensation is normal and will slowly go away on its own.  HOME CARE INSTRUCTIONS  Take all medicines as directed. Only take over-the-counter or prescription medicines for pain, discomfort, or fever as directed by your caregiver. Resume daily activities as directed. Showers are preferred over  baths for 2 weeks. You may resume sexual activities in 1 week or as you feel you would like to. Do not drive while taking narcotics.  SEEK MEDICAL CARE IF: . There is increasing abdominal pain. You feel lightheaded or faint. You have the chills. You have an oral temperature above 102 F (38.9 C). There is pus-like (purulent) drainage from any of the wounds. You are unable to pass gas or have a bowel  movement. You feel sick to your stomach (nauseous) or throw up (vomit) and can't control it with your medicines.  MAKE SURE YOU:  Understand these instructions. Will watch your condition. Will get help right away if you are not doing well or get worse.  ExitCare Patient Information 2013 [place].

## 2022-08-15 NOTE — Anesthesia Preprocedure Evaluation (Signed)
Anesthesia Evaluation  Patient identified by MRN, date of birth, ID band Patient awake    Reviewed: Allergy & Precautions, NPO status , Patient's Chart, lab work & pertinent test results  Airway Mallampati: III  TM Distance: >3 FB Neck ROM: full    Dental no notable dental hx.    Pulmonary neg pulmonary ROS   Pulmonary exam normal        Cardiovascular Exercise Tolerance: Good negative cardio ROS Normal cardiovascular exam     Neuro/Psych  Headaches  Neuromuscular disease  negative psych ROS   GI/Hepatic negative GI ROS, Neg liver ROS,GERD  Medicated,,  Endo/Other  negative endocrine ROS    Renal/GU      Musculoskeletal   Abdominal   Peds  Hematology negative hematology ROS (+) Blood dyscrasia, anemia , REFUSES BLOOD PRODUCTS, JEHOVAH'S WITNESS  Anesthesia Other Findings Past Medical History: No date: Abnormal menstrual periods No date: Allergy No date: Anemia No date: Endometriosis No date: Frequent headaches     Comment:  otc med prn No date: GERD (gastroesophageal reflux disease) No date: Urticaria  Past Surgical History: 02/14/2016: LAPAROSCOPY; N/A     Comment:  Procedure: LAPAROSCOPY DIAGNOSTIC FULGERATION OF PELVIC               ENDOMETRIOSIS;  Surgeon: Servando Salina, MD;                Location: [facility];  Service: Gynecology;  Laterality: N/A;              @ 2hrs. No date: WISDOM TOOTH EXTRACTION  BMI    Body Mass Index: 33.67 kg/m      Reproductive/Obstetrics negative OB ROS                             Anesthesia Physical Anesthesia Plan  ASA: 2  Anesthesia Plan: General ETT   Post-op Pain Management: Toradol IV (intra-op)*, Ofirmev IV (intra-op)* and Dilaudid IV   Induction: Intravenous  PONV Risk Score and Plan: 2 and Ondansetron, Dexamethasone, Midazolam and Treatment may vary due to age or medical condition  Airway Management Planned: Oral  ETT  Additional Equipment:   Intra-op Plan:   Post-operative Plan: Extubation in OR  Informed Consent: I have reviewed the patients History and Physical, chart, labs and discussed the procedure including the risks, benefits and alternatives for the proposed anesthesia with the patient or authorized representative who has indicated his/her understanding and acceptance.     Dental Advisory Given  Plan Discussed with: Anesthesiologist, CRNA and Surgeon  Anesthesia Plan Comments: (Patient consented for risks of anesthesia including but not limited to:  - adverse reactions to medications - damage to eyes, teeth, lips or other oral mucosa - nerve damage due to positioning  - sore throat or hoarseness - Damage to heart, brain, nerves, lungs, other parts of body or loss of life  Patient voiced understanding.)       Anesthesia Quick Evaluation

## 2022-08-15 NOTE — Op Note (Signed)
Robotic assisted laparoscopic repair mesenteric defect w Lysis of adhesions   Pre-operative Diagnosis: Endometriosis  Post-operative Diagnosis: same Surgeon: Caroleen Hamman, MD FACS  Anesthesia: Gen. with endotracheal tube  Findings: Small tear mesentery No evidence of any bowel injuries active bleeding or contamination to suggest viscus perforation  Estimated Blood Loss: minimal for my portion  Complications: none   Procedure Details  I was called by Dr. Trixie Rude to the OR. She had already started the case laparoscopically and on arrival she had three 8 mm robotic ports.  She entered the abdominal cavity and shorltye after visualized a small tear within the mesentery of the small bowel.  There was no evidence of bleeding there was no evidence of contamination.  Initially  I did a diagnostic laparoscopy using existing camera and instruments revealing evidence of a small mesenteric defect. We were able to dock the robot in the standard fashion to proceed with a robotic assisted repair of the mesenteric defect and also assessment of the bowel. The first was able to assess the bowel and there were some thin adhesions that were able to be lysed.  The bowel was run for the most part from the ligament of Treitz all the way to the terminal ileum.  There was no evidence of any bowel injuries.  There was a mesenteric defect measuring approximately 1/2 cm and there was no evidence of active bleeding.  There is no evidence of viscus perforation.  As of the chance of internal hernia I decided to close the defect.  Using a 2-0 Vicryl suture I was able to do 2 interrupted superficial bites within the mesentery.  There was no evidence of ischemia of the bowel and there was no evidence of active bleeding. I turned the case back to Dr. Leafy Ro to proceed with her gynecological procedure.  No evidence of complications for my portion of the procedure.              Caroleen Hamman, MD, FACS

## 2022-08-15 NOTE — Op Note (Signed)
Hannah Ford PROCEDURE DATE: 08/15/2022  PREOPERATIVE DIAGNOSIS: Chronic pelvic pain POSTOPERATIVE DIAGNOSIS: The same, +endometriosis PROCEDURE:  Panel 1 XI ROBOT ASSISTED OPERATIVE LAPAROSCOPY, EXCISION OF ENDOMETRIOSIS, PERITONEAL BIOPSIES LYSIS OF ADHESIONS, PERITONEAL STRIPPING: 86578 (CPT) INTRAUTERINE DEVICE (IUD) INSERTION, MIRENA: 58300 (CPT) Panel 2 XI ROBOTIC ASSISTED LAPAROSCOPIC LYSIS OF ADHESION: [id number] (CPT) Peritoneal stripping Exam under anesthesia  [id number]  SURGEON:  Dr. Benjaman Kindler, MD ASSISTANT: CST with [facility] PA student present.  Anesthesiologist:  Anesthesiologist: Ilene Qua, MD CRNA: Doreen Salvage, CRNA; Jerrye Noble, CRNA; Cammie Sickle, CRNA  INDICATIONS: 27 y.o. F here for exploratory surgical management secondary to the indications listed under preoperative diagnoses; please see preoperative note for further details.   Risks of surgery were discussed with the patient including but not limited to: bleeding which may require transfusion or reoperation; infection which may require antibiotics; injury to bowel, bladder, ureters or other surrounding organs; need for additional procedures; thromboembolic phenomenon, incisional problems and other postoperative/anesthesia complications. Written informed consent was obtained.    FINDINGS:   Pelvic exam: External genitalia negative for lesions. Vagina negative. Adnexa negative for masses or nodularity. Cervix without gross lesions, and IUD strings visualized after the procedure. Uterus mobile, anteverted, small.   Intraoperative findings revealed a normal upper abdomen including bowel, diaphragmatic surfaces, stomach, and omentum.  There was filmy scar tissue in her right lower quadrant above the cecum. She had some scar tissue along her left lateral sigmoid colon, which was removed.   The uterus was small and mobile, but globular and somewhat boggy-looking. Endo lesions noted in the posterior cul  de sac, in both ovarian fossae and along the anterior bladder flap. An [employer] window was noted in the right anterior pelvis and thick scarring of the peritoneum at the bladder flap to the uterus was noted and removed. It looked like she had a c/s scar at this site.  Dark powder burn endo noted in the posterior cul de sac, and all other areas were clear blebs or white stellate peritoneal scarring.    ANESTHESIA:    General INTRAVENOUS FLUIDS: 700  ml ESTIMATED BLOOD LOSS:10 ml URINE OUTPUT: 100 ml  SPECIMENS: Peritoneal biopsies  COMPLICATIONS: None immediate  PROCEDURE IN DETAIL: After informed consent was obtained, the patient was taken to the operating room where general anesthesia was obtained without difficulty. The patient was positioned in the dorsal lithotomy position in [place] and her arms were carefully tucked at her sides and the usual precautions were taken. Deep Trendelenburg (20-25 deg) was established to confirm that she does not shift on the table.  She was prepped and draped in normal sterile fashion.  Time-out was performed and a Foley catheter was placed into the bladder. A zumi uterine manipulator was placed carefully.  After infiltration of local anesthetic at the proposed trocar sites, an 8 mm incision was created supraumbilically, and a robotic 78m port was placed under direct visualization. Pneumoperitoneum was created to a pressure of 10 mm Hg.  Concern for a hemostatic mesenteric rent just below the port prompted me to consult gen surg prior to proceeding with the case.  Right lateral 8-mm robotic port was placed under direct visualization and a 855mleft port similarly placed. We docked the robot in a left side dock and he placed a figure of eight on the mesentary rent for safety. No other concerns were noted. No bowel injury noted. No bleeding from this site at all. Please see his note for  details.   A survey of the pelvis and upper abdomen revealed the  above findings. The patient was placed in deepTrendelenburg and the bowel was displaced up into the upper abdomen. The instruments were placed under direct visualization.   Full examination of the pelvis at close range as well as the upper abdomen was undertaken.  The appendix was never visualized biopsies were taken in the bilateral ovarian fossa, the posterior cul-de-sac.  The posterior cul-de-sac was a site of peritoneal stripping, and we filled this area with [place] though there was minimal bleeding altogether.  The clear blebs anteriorly were cauterized or excised, what ever was safest.  The Mirena IUD was placed under direct visualization.  Intra-abdominal pressure was dropped to confirm complete hemostasis.  The robot was undocked. The lateral trocars were removed under visualization.  The CO2 gas was released and several deep breaths given to remove any remaining CO2 from the peritoneal cavity.  The skin incisions were closed with 4-0 Monocryl subcuticular stitch and dermabond.   Anesthesia was reversed without difficulty.  The patient tolerated the procedure well.  Sponge, lap and needle counts were correct x2.  The patient was taken to recovery room in excellent condition.

## 2022-08-15 NOTE — Interval H&P Note (Signed)
History and Physical Interval Note:  08/15/2022 10:10 AM  Hannah Ford  has presented today for surgery, with the diagnosis of dysmenorrhea, menorrhagia.  The various methods of treatment have been discussed with the patient and family. After consideration of risks, benefits and other options for treatment, the patient has consented to  Procedure(s): XI ROBOT ASSISTED DIAGNOSTIC LAPAROSCOPY, EXCISION OF ENDOMETRIOSIS, LYSIS OF ADHESIONS (N/A) INTRAUTERINE DEVICE (IUD) INSERTION, MIRENA (N/A) as a surgical intervention.  The patient's history has been reviewed, patient examined, no change in status, stable for surgery.  I have reviewed the patient's chart and labs.  Questions were answered to the patient's satisfaction.     Benjaman Kindler

## 2022-08-15 NOTE — Transfer of Care (Signed)
Immediate Anesthesia Transfer of Care Note  Patient: [place] B Faas  Procedure(s) Performed: XI ROBOT ASSISTED OPERATIVE LAPAROSCOPY, EXCISION OF ENDOMETRIOSIS, LYSIS OF ADHESIONS INTRAUTERINE DEVICE (IUD) INSERTION, MIRENA XI ROBOTIC ASSISTED LAPAROSCOPIC LYSIS OF ADHESION  Patient Location: PACU  Anesthesia Type:General  Level of Consciousness: drowsy  Airway & Oxygen Therapy: Patient Spontanous Breathing and Patient connected to face mask oxygen  Post-op Assessment: Report given to RN and Post -op Vital signs reviewed and stable  Post vital signs: Reviewed and stable  Last Vitals:  Vitals Value Taken Time  BP 131/68 08/15/22 1446  Temp 36.6 C 08/15/22 1446  Pulse 69 08/15/22 1448  Resp 17 08/15/22 1448  SpO2 100 % 08/15/22 1448  Vitals shown include unvalidated device data.  Last Pain:  Vitals:   08/15/22 1446  TempSrc:   PainSc: 0-No pain         Complications: No notable events documented.

## 2022-08-15 NOTE — Anesthesia Procedure Notes (Signed)
Procedure Name: Intubation Date/Time: 08/15/2022 12:01 PM  Performed by: Doreen Salvage, CRNAPre-anesthesia Checklist: Patient identified, Patient being monitored, Timeout performed, Emergency Drugs available and Suction available Patient Re-evaluated:Patient Re-evaluated prior to induction Oxygen Delivery Method: Circle system utilized Preoxygenation: Pre-oxygenation with 100% oxygen Induction Type: IV induction Ventilation: Mask ventilation without difficulty Laryngoscope Size: Mac and 3 Grade View: Grade I Tube type: Oral Tube size: 7.0 mm Number of attempts: 1 Airway Equipment and Method: Stylet Placement Confirmation: ETT inserted through vocal cords under direct vision, positive ETCO2 and breath sounds checked- equal and bilateral Secured at: 21 cm Tube secured with: Tape Dental Injury: Teeth and Oropharynx as per pre-operative assessment

## 2022-08-16 NOTE — Anesthesia Postprocedure Evaluation (Signed)
Anesthesia Post Note  Patient: Hannah Ford  Procedure(s) Performed: XI ROBOT ASSISTED OPERATIVE LAPAROSCOPY, EXCISION OF ENDOMETRIOSIS, PERITONEAL BIOPSIES LYSIS OF ADHESIONS, PERITONEAL STRIPPING INTRAUTERINE DEVICE (IUD) INSERTION, MIRENA XI ROBOTIC ASSISTED LAPAROSCOPIC LYSIS OF ADHESION  Patient location during evaluation: PACU Anesthesia Type: General Level of consciousness: awake and alert Pain management: pain level controlled Vital Signs Assessment: post-procedure vital signs reviewed and stable Respiratory status: spontaneous breathing, nonlabored ventilation, respiratory function stable and patient connected to nasal cannula oxygen Cardiovascular status: blood pressure returned to baseline and stable Postop Assessment: no apparent nausea or vomiting Anesthetic complications: no   No notable events documented.   Last Vitals:  Vitals:   08/15/22 1530 08/15/22 1547  BP: 129/77 127/72  Pulse: 73 90  Resp: 10 18  Temp: 36.9 C (!) 36.2 C  SpO2: 100% 100%    Last Pain:  Vitals:   08/15/22 1547  TempSrc: Temporal  PainSc:                  Ilene Qua

## 2022-08-17 ENCOUNTER — Encounter: Payer: Self-pay | Admitting: Obstetrics and Gynecology

## 2022-08-19 LAB — SURGICAL PATHOLOGY

## 2022-09-01 DIAGNOSIS — Z09 Encounter for follow-up examination after completed treatment for conditions other than malignant neoplasm: Secondary | ICD-10-CM | POA: Diagnosis not present

## 2022-09-01 DIAGNOSIS — N809 Endometriosis, unspecified: Secondary | ICD-10-CM | POA: Diagnosis not present

## 2022-10-08 NOTE — H&P (Signed)
Pre-Procedure H&P   Patient ID: Hannah Ford is a 27 y.o. female.  Gastroenterology Provider: [name], DO  Referring Provider: Vevelyn Pat, NP PCP: Tresa Garter, MD  Date: 10/09/2022  HPI Ms. Hannah Ford is a 27 y.o. female who presents today for Esophagogastroduodenoscopy and Colonoscopy for abdominal pain, BRBPR .  Patient with abdominal pain that is improved with PPI.  She underwent a CT in January 2024 as well as an ultrasound.  Only the ultrasound demonstrated possible liver inflammation, but CT was unremarkable.  In March, she underwent ex lap for adhesion lysis.  Patient had noted bright red blood per rectum intermittently.  Paternal grandmother with colorectal cancer  Most recent lab work fecal Cal 5 ESR 15 CRP 3 celiac negative hemoglobin 12.6 platelets 227,000 MCV 85 lipase 47   Past Medical History:  Diagnosis Date   Abnormal menstrual periods    Allergy    Anemia    Endometriosis    Frequent headaches    otc med prn   GERD (gastroesophageal reflux disease)    Urticaria     Past Surgical History:  Procedure Laterality Date   INTRAUTERINE DEVICE (IUD) INSERTION N/A 08/15/2022   Procedure: INTRAUTERINE DEVICE (IUD) INSERTION, MIRENA;  Surgeon: Christeen Douglas, MD;  Location: [facility];  Service: Gynecology;  Laterality: N/A;   LAPAROSCOPY N/A 02/14/2016   Procedure: LAPAROSCOPY DIAGNOSTIC FULGERATION OF PELVIC ENDOMETRIOSIS;  Surgeon: Maxie Better, MD;  Location: [facility];  Service: Gynecology;  Laterality: N/A;  @ 2hrs.   ROBOTIC ASSISTED LAPAROSCOPIC LYSIS OF ADHESION  08/15/2022   Procedure: XI ROBOTIC ASSISTED LAPAROSCOPIC LYSIS OF ADHESION;  Surgeon: Leafy Ro, MD;  Location: [facility];  Service: General;;  WITH REPAIR MESENTERY   WISDOM TOOTH EXTRACTION     XI ROBOT ASSISTED DIAGNOSTIC LAPAROSCOPY N/A 08/15/2022   Procedure: XI ROBOT ASSISTED OPERATIVE LAPAROSCOPY, EXCISION OF ENDOMETRIOSIS, PERITONEAL BIOPSIES LYSIS OF  ADHESIONS, PERITONEAL STRIPPING;  Surgeon: Christeen Douglas, MD;  Location: [facility];  Service: Gynecology;  Laterality: N/A;    Family History MGM- CRC No h/o GI disease or malignancy  Review of Systems  Constitutional:  Negative for activity change, appetite change, chills, diaphoresis, fatigue, fever and unexpected weight change.  HENT:  Negative for trouble swallowing and voice change.   Respiratory:  Negative for shortness of breath and wheezing.   Cardiovascular:  Negative for chest pain, palpitations and leg swelling.  Gastrointestinal:  Positive for abdominal pain and blood in stool. Negative for abdominal distention, anal bleeding, constipation, diarrhea, nausea, rectal pain and vomiting.  Musculoskeletal:  Negative for arthralgias and myalgias.  Skin:  Negative for color change and pallor.  Neurological:  Negative for dizziness, syncope and weakness.  Psychiatric/Behavioral:  Negative for confusion.   All other systems reviewed and are negative.    Medications No current facility-administered medications on file prior to encounter.   Current Outpatient Medications on File Prior to Encounter  Medication Sig Dispense Refill   docusate sodium (COLACE) 100 MG capsule Take 1 capsule (100 mg total) by mouth 2 (two) times daily. To keep stools soft 30 capsule 0   Ferrous Fumarate (IRON) 18 MG TBCR      fluticasone (FLONASE) 50 MCG/ACT nasal spray Place 2 sprays into both nostrils daily. 16 g 6   ondansetron (ZOFRAN) 4 MG tablet Take 1 tablet (4 mg total) by mouth every 8 (eight) hours as needed for nausea or vomiting. 20 tablet 1   oxyCODONE (OXY IR/ROXICODONE) 5 MG  immediate release tablet Take 1 tablet (5 mg total) by mouth every 4 (four) hours as needed for severe pain. 15 tablet 0   pantoprazole (PROTONIX) 40 MG tablet Take 1 tablet (40 mg total) by mouth daily. 30 tablet 1   sucralfate (CARAFATE) 1 GM/10ML suspension Take 10 mLs (1 g total) by mouth 4 (four) times daily -  with  meals and at bedtime. 420 mL 0   SUMAtriptan (IMITREX) 100 MG tablet Take one prn migraine. May repeat in 2 hours if headache persists or recurs. 12 tablet 5   gabapentin (NEURONTIN) 800 MG tablet Take 1 tablet (800 mg total) by mouth at bedtime for 14 days. Take nightly for 3 days, then up to 14 days as needed 14 tablet 0    Pertinent medications related to GI and procedure were reviewed by me with the patient prior to the procedure   Current Facility-Administered Medications:    0.9 %  sodium chloride infusion, , Intravenous, Continuous, [name], DO      Allergies  Allergen Reactions   Latex Other (See Comments)    unknown Other reaction(s): Other (See Comments) unknown    Maxalt [Rizatriptan]     Mild side effects   Omnicef [Cefdinir]     Nausea, diarrhea after 1st week of use   Zithromax [Azithromycin] Other (See Comments)    Face redness and swelling   Chlorhexidine Rash   Penicillins Rash    Has patient had a PCN reaction causing immediate rash, facial/tongue/throat swelling, SOB or lightheadedness with hypotension: Yes Has patient had a PCN reaction causing severe rash involving mucus membranes or skin necrosis: Yes Has patient had a PCN reaction that required hospitalization No Has patient had a PCN reaction occurring within the last 10 years: No If all of the above answers are "NO", then may proceed with Cephalosporin use.    Allergies were reviewed by me prior to the procedure  Objective   Body mass index is 33.05 kg/m. Vitals:   10/09/22 1130  BP: 133/67  Pulse: 75  Resp: 15  Temp: (!) 96.9 F (36.1 C)  TempSrc: Temporal  SpO2: 100%  Weight: 95.7 kg  Height:  (1.702 m)     Physical Exam Vitals and nursing note reviewed.  Constitutional:      General: She is not in acute distress.    Appearance: Normal appearance. She is not ill-appearing, toxic-appearing or diaphoretic.  HENT:     Head: Normocephalic and atraumatic.     Nose:  Nose normal.     Mouth/Throat:     Mouth: Mucous membranes are moist.     Pharynx: Oropharynx is clear.  Eyes:     General: No scleral icterus.    Extraocular Movements: Extraocular movements intact.  Cardiovascular:     Rate and Rhythm: Normal rate and regular rhythm.     Heart sounds: Normal heart sounds. No murmur heard.    No friction rub. No gallop.  Pulmonary:     Effort: Pulmonary effort is normal. No respiratory distress.     Breath sounds: Normal breath sounds. No wheezing, rhonchi or rales.  Abdominal:     General: Bowel sounds are normal. There is no distension.     Palpations: Abdomen is soft.     Tenderness: There is no abdominal tenderness. There is no guarding or rebound.  Musculoskeletal:     Cervical back: Neck supple.     Right lower leg: No edema.     Left  lower leg: No edema.  Skin:    General: Skin is warm and dry.     Coloration: Skin is not jaundiced or pale.  Neurological:     General: No focal deficit present.     Mental Status: She is alert and oriented to person, place, and time. Mental status is at baseline.  Psychiatric:        Mood and Affect: Mood normal.        Behavior: Behavior normal.        Thought Content: Thought content normal.        Judgment: Judgment normal.      Assessment:  Ms. Hannah Ford is a 27 y.o. female  who presents today for Esophagogastroduodenoscopy and Colonoscopy for abdominal pain, BRBPR .  Plan:  Esophagogastroduodenoscopy and Colonoscopy with possible intervention today  Esophagogastroduodenoscopy and Colonoscopy with possible biopsy, control of bleeding, polypectomy, and interventions as necessary has been discussed with the patient/patient representative. Informed consent was obtained from the patient/patient representative after explaining the indication, nature, and risks of the procedure including but not limited to death, bleeding, perforation, missed neoplasm/lesions, cardiorespiratory compromise, and  reaction to medications. Opportunity for questions was given and appropriate answers were provided. Patient/patient representative has verbalized understanding is amenable to undergoing the procedure.   [name], DO  [facility]  Portions of the record may have been created with voice recognition software. Occasional wrong-word or 'sound-a-like' substitutions may have occurred due to the inherent limitations of voice recognition software.  Read the chart carefully and recognize, using context, where substitutions may have occurred.

## 2022-10-09 ENCOUNTER — Encounter
Admission: RE | Disposition: A | Discharge status: Home / Self Care | Payer: Self-pay | Source: Home / Self Care | Attending: Gastroenterology

## 2022-10-09 ENCOUNTER — Other Ambulatory Visit: Payer: Self-pay

## 2022-10-09 ENCOUNTER — Ambulatory Visit
Admission: RE | Admit: 2022-10-09 | Discharge: 2022-10-09 | Disposition: A | Discharge status: Home / Self Care | Payer: 59 | Attending: Gastroenterology | Admitting: Gastroenterology

## 2022-10-09 ENCOUNTER — Ambulatory Visit: Payer: 59 | Admitting: Anesthesiology

## 2022-10-09 ENCOUNTER — Encounter: Payer: Self-pay | Admitting: Gastroenterology

## 2022-10-09 DIAGNOSIS — K5790 Diverticulosis of intestine, part unspecified, without perforation or abscess without bleeding: Secondary | ICD-10-CM | POA: Diagnosis not present

## 2022-10-09 DIAGNOSIS — Z800 Family history of malignant neoplasm of digestive organs: Secondary | ICD-10-CM | POA: Insufficient documentation

## 2022-10-09 DIAGNOSIS — R1013 Epigastric pain: Secondary | ICD-10-CM | POA: Diagnosis not present

## 2022-10-09 DIAGNOSIS — R109 Unspecified abdominal pain: Secondary | ICD-10-CM | POA: Diagnosis not present

## 2022-10-09 DIAGNOSIS — R1031 Right lower quadrant pain: Secondary | ICD-10-CM | POA: Diagnosis not present

## 2022-10-09 DIAGNOSIS — R1084 Generalized abdominal pain: Secondary | ICD-10-CM | POA: Insufficient documentation

## 2022-10-09 DIAGNOSIS — K5710 Diverticulosis of small intestine without perforation or abscess without bleeding: Secondary | ICD-10-CM | POA: Diagnosis not present

## 2022-10-09 DIAGNOSIS — K921 Melena: Secondary | ICD-10-CM | POA: Insufficient documentation

## 2022-10-09 HISTORY — PX: COLONOSCOPY WITH PROPOFOL: SHX5780

## 2022-10-09 HISTORY — PX: ESOPHAGOGASTRODUODENOSCOPY (EGD) WITH PROPOFOL: SHX5813

## 2022-10-09 LAB — POCT PREGNANCY, URINE
Preg Test, Ur: NEGATIVE
Preg Test, Ur: NEGATIVE

## 2022-10-09 SURGERY — COLONOSCOPY WITH PROPOFOL
Anesthesia: General

## 2022-10-09 SURGERY — Surgical Case
Anesthesia: *Unknown

## 2022-10-09 MED ORDER — LIDOCAINE HCL (CARDIAC) PF 100 MG/5ML IV SOSY
PREFILLED_SYRINGE | INTRAVENOUS | Status: DC | PRN
  Administered 2022-10-09: 100 mg via INTRAVENOUS

## 2022-10-09 MED ORDER — PROPOFOL 500 MG/50ML IV EMUL
INTRAVENOUS | Status: DC | PRN
  Administered 2022-10-09: 150 ug/kg/min via INTRAVENOUS
  Administered 2022-10-09 (×2): 50 mg via INTRAVENOUS

## 2022-10-09 MED ORDER — SODIUM CHLORIDE 0.9 % IV SOLN: INTRAVENOUS | Status: DC

## 2022-10-09 MED ORDER — MIDAZOLAM HCL 2 MG/2ML IJ SOLN
INTRAMUSCULAR | Status: AC
  Filled 2022-10-09: qty 2

## 2022-10-09 MED ORDER — DEXMEDETOMIDINE HCL IN NACL 80 MCG/20ML IV SOLN
INTRAVENOUS | Status: DC | PRN
  Administered 2022-10-09: 8 ug via INTRAVENOUS

## 2022-10-09 MED ORDER — EPHEDRINE SULFATE (PRESSORS) 50 MG/ML IJ SOLN
INTRAMUSCULAR | Status: DC | PRN
  Administered 2022-10-09 (×3): 5 mg via INTRAVENOUS

## 2022-10-09 NOTE — Anesthesia Preprocedure Evaluation (Signed)
Anesthesia Evaluation  Patient identified by MRN, date of birth, ID band Patient awake    Reviewed: Allergy & Precautions, NPO status , Patient's Chart, lab work & pertinent test results  History of Anesthesia Complications (+) DIFFICULT IV STICK / SPECIAL LINE and history of anesthetic complications  Airway Mallampati: III  TM Distance: >3 FB Neck ROM: full    Dental  (+) Chipped   Pulmonary neg pulmonary ROS, neg shortness of breath   Pulmonary exam normal        Cardiovascular Exercise Tolerance: Good Normal cardiovascular exam     Neuro/Psych  Headaches  Neuromuscular disease  negative psych ROS   GI/Hepatic negative GI ROS, Neg liver ROS,,,  Endo/Other  negative endocrine ROS    Renal/GU negative Renal ROS  negative genitourinary   Musculoskeletal   Abdominal   Peds  Hematology negative hematology ROS (+)   Anesthesia Other Findings Past Medical History: No date: Abnormal menstrual periods No date: Allergy No date: Anemia No date: Endometriosis No date: Frequent headaches     Comment:  otc med prn No date: GERD (gastroesophageal reflux disease) No date: Urticaria  Past Surgical History: 08/15/2022: INTRAUTERINE DEVICE (IUD) INSERTION; N/A     Comment:  Procedure: INTRAUTERINE DEVICE (IUD) INSERTION, MIRENA;               Surgeon: Christeen Douglas, MD;  Location: [facility];                Service: Gynecology;  Laterality: N/A; 02/14/2016: LAPAROSCOPY; N/A     Comment:  Procedure: LAPAROSCOPY DIAGNOSTIC FULGERATION OF PELVIC               ENDOMETRIOSIS;  Surgeon: Maxie Better, MD;                Location: [facility];  Service: Gynecology;  Laterality: N/A;              @ 2hrs. 08/15/2022: ROBOTIC ASSISTED LAPAROSCOPIC LYSIS OF ADHESION     Comment:  Procedure: XI ROBOTIC ASSISTED LAPAROSCOPIC LYSIS OF               ADHESION;  Surgeon: Leafy Ro, MD;  Location: [facility]               ORS;  Service:  General;;  WITH REPAIR MESENTERY No date: WISDOM TOOTH EXTRACTION 08/15/2022: XI ROBOT ASSISTED DIAGNOSTIC LAPAROSCOPY; N/A     Comment:  Procedure: XI ROBOT ASSISTED OPERATIVE LAPAROSCOPY,               EXCISION OF ENDOMETRIOSIS, PERITONEAL BIOPSIES LYSIS OF               ADHESIONS, PERITONEAL STRIPPING;  Surgeon: Christeen Douglas, MD;  Location: [facility] OR[facility];  Service: Gynecology;                Laterality: N/A;  BMI    Body Mass Index: 33.05 kg/m      Reproductive/Obstetrics negative OB ROS                             Anesthesia Physical Anesthesia Plan  ASA: 2  Anesthesia Plan: General   Post-op Pain Management:    Induction: Intravenous  PONV Risk Score and Plan: Propofol infusion and TIVA  Airway Management Planned: Natural Airway and Nasal Cannula  Additional Equipment:   Intra-op Plan:  Post-operative Plan:   Informed Consent: I have reviewed the patients History and Physical, chart, labs and discussed the procedure including the risks, benefits and alternatives for the proposed anesthesia with the patient or authorized representative who has indicated his/her understanding and acceptance.     Dental Advisory Given  Plan Discussed with: Anesthesiologist, CRNA and Surgeon  Anesthesia Plan Comments: (Patient consented for risks of anesthesia including but not limited to:  - adverse reactions to medications - risk of airway placement if required - damage to eyes, teeth, lips or other oral mucosa - nerve damage due to positioning  - sore throat or hoarseness - Damage to heart, brain, nerves, lungs, other parts of body or loss of life  Patient voiced understanding.)       Anesthesia Quick Evaluation

## 2022-10-09 NOTE — Op Note (Addendum)
[facility] Patient Name: Hannah Ford Procedure Date: 10/09/2022 12:45 PM MRN: [id number] Account #: [id number] Date of Birth: [birth year] Admit Type: Outpatient Age: 27 Room: [facility] ENDO ROOM 1 Gender: Female Note Status: Supervisor Override Instrument Name: Patton Salles Endoscope [id number] Procedure:             Upper GI endoscopy Indications:           Epigastric abdominal pain, Dyspepsia Providers:             Trenda Moots, DO Referring MD:          [name] DO, DO (Referring MD), Georgina Quint.                         Plotnikov MD, MD (Referring MD) Medicines:             Monitored Anesthesia Care Complications:         No immediate complications. Estimated blood loss:                         Minimal. Procedure:             Pre-Anesthesia Assessment:                        - Prior to the procedure, a History and Physical was                         performed, and patient medications and allergies were                         reviewed. The patient is competent. The risks and                         benefits of the procedure and the sedation options and                         risks were discussed with the patient. All questions                         were answered and informed consent was obtained.                         Patient identification and proposed procedure were                         verified by the physician, the nurse, the anesthetist                         and the technician in the endoscopy suite. Mental                         Status Examination: alert and oriented. Airway                         Examination: normal oropharyngeal airway and neck                         mobility. Respiratory Examination: clear to  auscultation. CV Examination: RRR, no murmurs, no S3                         or S4. Prophylactic Antibiotics: The patient does not                         require prophylactic  antibiotics. Prior                         Anticoagulants: The patient has taken no anticoagulant                         or antiplatelet agents. ASA Grade Assessment: II - A                         patient with mild systemic disease. After reviewing                         the risks and benefits, the patient was deemed in                         satisfactory condition to undergo the procedure. The                         anesthesia plan was to use monitored anesthesia care                         (MAC). Immediately prior to administration of                         medications, the patient was re-assessed for adequacy                         to receive sedatives. The heart rate, respiratory                         rate, oxygen saturations, blood pressure, adequacy of                         pulmonary ventilation, and response to care were                         monitored throughout the procedure. The physical                         status of the patient was re-assessed after the                         procedure.                        After obtaining informed consent, the endoscope was                         passed under direct vision. Throughout the procedure,                         the patient's blood pressure, pulse, and oxygen  saturations were monitored continuously. The Endoscope                         was introduced through the mouth, and advanced to the                         second part of duodenum. The upper GI endoscopy was                         accomplished without difficulty. The patient tolerated                         the procedure well. Findings:      The duodenal bulb, first portion of the duodenum and second portion of       the duodenum were normal. Biopsies for histology were taken with a cold       forceps for evaluation of celiac disease. Estimated blood loss was       minimal.      A medium non-bleeding diverticulum was found in the  second portion of       the duodenum. Estimated blood loss: none.      The entire examined stomach was normal. Biopsies were taken with a cold       forceps for Helicobacter pylori testing. Estimated blood loss was       minimal.      Esophagogastric landmarks were identified: the gastroesophageal junction       was found at 37 cm from the incisors.      The Z-line was regular. Estimated blood loss: none.      The exam of the esophagus was otherwise normal. Impression:            - Normal duodenal bulb, first portion of the duodenum                         and second portion of the duodenum. Biopsied.                        - Non-bleeding duodenal diverticulum.                        - Normal stomach. Biopsied.                        - Esophagogastric landmarks identified.                        - Z-line regular. Recommendation:        - Patient has a contact number available for                         emergencies. The signs and symptoms of potential                         delayed complications were discussed with the patient.                         Return to normal activities tomorrow. Written                         discharge  instructions were provided to the patient.                        - Discharge patient to home.                        - Resume previous diet.                        - Continue present medications.                        - Await pathology results.                        - Return to GI clinic as previously scheduled.                        - proceed with colonoscopy                        - The findings and recommendations were discussed with                         the patient.                        - Consider SIBO testing Procedure Code(s):     --- Professional ---                        [phone], Esophagogastroduodenoscopy, flexible,                         transoral; with biopsy, single or multiple Diagnosis Code(s):     --- Professional ---                         R10.84, Generalized abdominal pain                        K57.10, Diverticulosis of small intestine without                         perforation or abscess without bleeding CPT copyright 2022 [employer] Association. All rights reserved. The codes documented in this report are preliminary and upon coder review may  be revised to meet current compliance requirements. Attending Participation:      I personally performed the entire procedure. Elfredia Nevins, DO [name] DO, DO 10/09/2022 1:34:53 PM This report has been signed electronically. Number of Addenda: 0 Note Initiated On: 10/09/2022 12:45 PM Estimated Blood Loss:  Estimated blood loss was minimal.      [facility]

## 2022-10-09 NOTE — Interval H&P Note (Signed)
History and Physical Interval Note: Preprocedure H&P from 10/09/22  was reviewed and there was no interval change after seeing and examining the patient.  Written consent was obtained from the patient after discussion of risks, benefits, and alternatives. Patient has consented to proceed with Esophagogastroduodenoscopy and Colonoscopy with possible intervention   10/09/2022 12:25 PM  Hannah Ford  has presented today for surgery, with the diagnosis of epigastric pain ,abdominal paim,rectal bleeding,dyspenia.  The various methods of treatment have been discussed with the patient and family. After consideration of risks, benefits and other options for treatment, the patient has consented to  Procedure(s): COLONOSCOPY WITH PROPOFOL (N/A) ESOPHAGOGASTRODUODENOSCOPY (EGD) WITH PROPOFOL (N/A) as a surgical intervention.  The patient's history has been reviewed, patient examined, no change in status, stable for surgery.  I have reviewed the patient's chart and labs.  Questions were answered to the patient's satisfaction.     [name]

## 2022-10-09 NOTE — Anesthesia Postprocedure Evaluation (Signed)
Anesthesia Post Note  Patient: Hannah Ford  Procedure(s) Performed: COLONOSCOPY WITH PROPOFOL ESOPHAGOGASTRODUODENOSCOPY (EGD) WITH PROPOFOL  Patient location during evaluation: PACU Anesthesia Type: General Level of consciousness: awake and alert Pain management: pain level controlled Vital Signs Assessment: post-procedure vital signs reviewed and stable Respiratory status: spontaneous breathing, nonlabored ventilation and respiratory function stable Cardiovascular status: blood pressure returned to baseline and stable Postop Assessment: no apparent nausea or vomiting Anesthetic complications: no   There were no known notable events for this encounter.   Last Vitals:  Vitals:   10/09/22 1336 10/09/22 1346  BP: 122/79 124/66  Pulse: 82 73  Resp: 17 16  Temp: (!) 36.1 C   SpO2: 100% 100%    Last Pain:  Vitals:   10/09/22 1346  TempSrc:   PainSc: 0-No pain                 Foye Deer

## 2022-10-09 NOTE — Op Note (Addendum)
[facility] Patient Name: Hannah Ford Procedure Date: 10/09/2022 12:46 PM MRN: [id number] Account #: [id number] Date of Birth: 01-Apr-1996 Admit Type: Outpatient Age: 27 Room: [facility] ENDO ROOM 1 Gender: Female Note Status: Supervisor Override Instrument Name: Peds Colonoscope [id number] Procedure:             Colonoscopy Indications:           Hematochezia, Abdominal pain in the right lower                         quadrant Providers:             Trenda Moots, DO Referring MD:          Georgina Quint. Plotnikov MD, MD (Referring MD) Medicines:             Monitored Anesthesia Care Complications:         No immediate complications. Estimated blood loss: None. Procedure:             Pre-Anesthesia Assessment:                        - Prior to the procedure, a History and Physical was                         performed, and patient medications and allergies were                         reviewed. The patient is competent. The risks and                         benefits of the procedure and the sedation options and                         risks were discussed with the patient. All questions                         were answered and informed consent was obtained.                         Patient identification and proposed procedure were                         verified by the physician, the nurse, the anesthetist                         and the technician in the endoscopy suite. Mental                         Status Examination: alert and oriented. Airway                         Examination: normal oropharyngeal airway and neck                         mobility. Respiratory Examination: clear to                         auscultation. CV Examination: RRR, no murmurs, no S3  or S4. Prophylactic Antibiotics: The patient does not                         require prophylactic antibiotics. Prior                         Anticoagulants: The  patient has taken no anticoagulant                         or antiplatelet agents. ASA Grade Assessment: II - A                         patient with mild systemic disease. After reviewing                         the risks and benefits, the patient was deemed in                         satisfactory condition to undergo the procedure. The                         anesthesia plan was to use monitored anesthesia care                         (MAC). Immediately prior to administration of                         medications, the patient was re-assessed for adequacy                         to receive sedatives. The heart rate, respiratory                         rate, oxygen saturations, blood pressure, adequacy of                         pulmonary ventilation, and response to care were                         monitored throughout the procedure. The physical                         status of the patient was re-assessed after the                         procedure.                        After obtaining informed consent, the colonoscope was                         passed under direct vision. Throughout the procedure,                         the patient's blood pressure, pulse, and oxygen                         saturations were monitored continuously. The  Colonoscope was introduced through the anus and                         advanced to the the terminal ileum, with                         identification of the appendiceal orifice and IC                         valve. The colonoscopy was performed without                         difficulty. The patient tolerated the procedure well.                         The quality of the bowel preparation was evaluated                         using the BBPS [facility] Bowel Preparation Scale) with                         scores of: Right Colon = 3 (entire mucosa seen well                         with no residual staining, small fragments of  stool or                         opaque liquid), Transverse Colon = 3 (entire mucosa                         seen well with no residual staining, small fragments                         of stool or opaque liquid) and Left Colon = 2 (minor                         amount of residual staining, small fragments of stool                         and/or opaque liquid, but mucosa seen well). The total                         BBPS score equals 8. The quality of the bowel                         preparation was excellent. The terminal ileum,                         ileocecal valve, appendiceal orifice, and rectum were                         photographed. Findings:      The perianal and digital rectal examinations were normal. Pertinent       negatives include normal sphincter tone.      The terminal ileum appeared normal. Estimated blood loss: none.      The colon (entire examined portion) appeared normal. Estimated blood  loss: none.      The exam was otherwise without abnormality on direct and retroflexion       views. Impression:            - The examined portion of the ileum was normal.                        - The entire examined colon is normal.                        - The examination was otherwise normal on direct and                         retroflexion views.                        - No specimens collected. Recommendation:        - Patient has a contact number available for                         emergencies. The signs and symptoms of potential                         delayed complications were discussed with the patient.                         Return to normal activities tomorrow. Written                         discharge instructions were provided to the patient.                        - Discharge patient to home.                        - Resume previous diet.                        - Continue present medications.                        - Repeat colonoscopy at appropriate age  for screening                         purposes.                        - Return to GI clinic as previously scheduled.                        - The findings and recommendations were discussed with                         the patient. Procedure Code(s):     --- Professional ---                        [phone], Colonoscopy, flexible; diagnostic, including                         collection of specimen(s) by brushing or washing, when  performed (separate procedure) Diagnosis Code(s):     --- Professional ---                        R10.84, Generalized abdominal pain                        K92.1, Melena (includes Hematochezia) CPT copyright 2022 American Medical Association. All rights reserved. The codes documented in this report are preliminary and upon coder review may  be revised to meet current compliance requirements. Attending Participation:      I personally performed the entire procedure. Elfredia Nevins, DO [name] DO, DO 10/09/2022 1:35:51 PM This report has been signed electronically. Number of Addenda: 0 Note Initiated On: 10/09/2022 12:46 PM Scope Withdrawal Time: 0 hours 11 minutes 8 seconds  Total Procedure Duration: 0 hours 15 minutes 41 seconds  Estimated Blood Loss:  Estimated blood loss: none.      [facility]

## 2022-10-09 NOTE — Transfer of Care (Signed)
Immediate Anesthesia Transfer of Care Note  Patient: Hannah Ford  Procedure(s) Performed: COLONOSCOPY WITH PROPOFOL ESOPHAGOGASTRODUODENOSCOPY (EGD) WITH PROPOFOL  Patient Location: PACU  Anesthesia Type:General  Level of Consciousness: awake, alert , and oriented  Airway & Oxygen Therapy: Patient Spontanous Breathing  Post-op Assessment: Report given to RN and Post -op Vital signs reviewed and stable  Post vital signs: stable  Last Vitals:  Vitals Value Taken Time  BP 122/79 10/09/22 1336  Temp 36.1 C 10/09/22 1336  Pulse 70 10/09/22 1337  Resp 17 10/09/22 1337  SpO2 100 % 10/09/22 1337  Vitals shown include unvalidated device data.  Last Pain:  Vitals:   10/09/22 1336  TempSrc:   PainSc: Asleep         Complications: No notable events documented.

## 2022-10-10 ENCOUNTER — Encounter: Payer: Self-pay | Admitting: Gastroenterology

## 2022-10-10 LAB — SURGICAL PATHOLOGY

## 2022-12-04 DIAGNOSIS — R6881 Early satiety: Secondary | ICD-10-CM | POA: Diagnosis not present

## 2022-12-04 DIAGNOSIS — R1030 Lower abdominal pain, unspecified: Secondary | ICD-10-CM | POA: Diagnosis not present

## 2022-12-04 DIAGNOSIS — R140 Abdominal distension (gaseous): Secondary | ICD-10-CM | POA: Diagnosis not present

## 2022-12-17 DIAGNOSIS — R140 Abdominal distension (gaseous): Secondary | ICD-10-CM | POA: Diagnosis not present

## 2022-12-17 DIAGNOSIS — R6881 Early satiety: Secondary | ICD-10-CM | POA: Diagnosis not present

## 2023-01-12 ENCOUNTER — Other Ambulatory Visit: Payer: Self-pay | Admitting: Oncology

## 2023-01-12 DIAGNOSIS — Z006 Encounter for examination for normal comparison and control in clinical research program: Secondary | ICD-10-CM

## 2023-01-14 ENCOUNTER — Encounter (HOSPITAL_BASED_OUTPATIENT_CLINIC_OR_DEPARTMENT_OTHER): Payer: Self-pay | Admitting: Emergency Medicine

## 2023-01-14 ENCOUNTER — Emergency Department (HOSPITAL_BASED_OUTPATIENT_CLINIC_OR_DEPARTMENT_OTHER): Payer: 59

## 2023-01-14 ENCOUNTER — Emergency Department (HOSPITAL_BASED_OUTPATIENT_CLINIC_OR_DEPARTMENT_OTHER)
Admission: EM | Admit: 2023-01-14 | Discharge: 2023-01-14 | Disposition: A | Discharge status: Home / Self Care | Payer: 59 | Attending: Emergency Medicine | Admitting: Emergency Medicine

## 2023-01-14 ENCOUNTER — Other Ambulatory Visit: Payer: Self-pay

## 2023-01-14 DIAGNOSIS — Z91040 Latex allergy status: Secondary | ICD-10-CM | POA: Insufficient documentation

## 2023-01-14 DIAGNOSIS — G43009 Migraine without aura, not intractable, without status migrainosus: Secondary | ICD-10-CM | POA: Diagnosis not present

## 2023-01-14 DIAGNOSIS — R519 Headache, unspecified: Secondary | ICD-10-CM | POA: Diagnosis not present

## 2023-01-14 DIAGNOSIS — G43001 Migraine without aura, not intractable, with status migrainosus: Secondary | ICD-10-CM | POA: Diagnosis not present

## 2023-01-14 LAB — PREGNANCY, URINE: Preg Test, Ur: NEGATIVE

## 2023-01-14 MED ORDER — KETOROLAC TROMETHAMINE 15 MG/ML IJ SOLN: 15.0000 mg | Freq: Once | INTRAMUSCULAR | Status: DC

## 2023-01-14 MED ORDER — PROCHLORPERAZINE EDISYLATE 10 MG/2ML IJ SOLN: 10.0000 mg | Freq: Once | INTRAMUSCULAR | Status: DC

## 2023-01-14 MED ORDER — KETOROLAC TROMETHAMINE 15 MG/ML IJ SOLN: 30.0000 mg | Freq: Once | INTRAMUSCULAR | Status: DC

## 2023-01-14 MED ORDER — DIPHENHYDRAMINE HCL 50 MG/ML IJ SOLN: 12.5000 mg | Freq: Once | INTRAMUSCULAR | Status: DC

## 2023-01-14 MED ORDER — SODIUM CHLORIDE 0.9 % IV SOLN: INTRAVENOUS | Status: DC

## 2023-01-14 MED ORDER — SODIUM CHLORIDE 0.9 % IV BOLUS: 1000.0000 mL | Freq: Once | INTRAVENOUS | Status: DC

## 2023-01-14 MED ORDER — ACETAMINOPHEN 500 MG PO TABS
1000.0000 mg | ORAL_TABLET | Freq: Once | ORAL | Status: AC
  Administered 2023-01-14: 1000 mg via ORAL
  Filled 2023-01-14: qty 2

## 2023-01-14 MED ORDER — KETOROLAC TROMETHAMINE 60 MG/2ML IM SOLN
30.0000 mg | Freq: Once | INTRAMUSCULAR | Status: AC
  Administered 2023-01-14: 30 mg via INTRAMUSCULAR
  Filled 2023-01-14: qty 2

## 2023-01-14 NOTE — ED Triage Notes (Signed)
Pt in with dull R sided HA since Saturday, but pain is severe this evening. +photophobia and R eye blurred vision. Pt was prescribed Rizatriptan q2h by a provider the other day, but it has not helped.

## 2023-01-14 NOTE — Discharge Instructions (Addendum)
Your CT scan did not show any concerning findings today.  You have been given a shot of Toradol for your headache.  You may take up to 1000mg  of tylenol every 6 hours as needed for pain. You may also rotate this with  up to 800mg  ibuprofen every 6 hours as needed for pain (Take tylenol, then 3 hours later take ibuprofen, then 3 hours later take tylenol, etc.)  Please keep hydrated and drink plenty of water.  Please follow-up with your primary care provider within the next month to discuss strategies for headache prevention treatment.  Return to the ER if your headache is still not improved within the next 24 hours, you have changes in vision, uncontrolled vomiting, fever or chills, any other new or concerning symptoms.

## 2023-01-14 NOTE — ED Provider Notes (Signed)
[place] EMERGENCY DEPARTMENT AT [facility] Provider Note   CSN: [id number] Arrival date & time: 01/14/23  1933     History  Chief Complaint  Patient presents with   Headache    Hannah Ford is a 27 y.o. female history of migraines, IBS presenting with concern for migraine 5 days.  States she was recently diagnosed with IBS and told not to use over-the-counter pain medications for headache, given rizatriptan by her primary care provider to use.  States this medication has not gotten rid of her headache.  Has had migraines since she was young, usually associated with pain behind the right eye.  This headache is different and that it involves the right eye and also right side of the head.  She notes an increase in headache frequency.  Reports photophobia and nausea.  Denies any fevers, chills, neck stiffness.   Headache      Home Medications Prior to Admission medications   Medication Sig Start Date End Date Taking? Authorizing Provider  docusate sodium (COLACE) 100 MG capsule Take 1 capsule (100 mg total) by mouth 2 (two) times daily. To keep stools soft 08/15/22   Christeen Douglas, MD  Ferrous Fumarate (IRON) 18 MG TBCR  08/14/21   [provider]  fluticasone (FLONASE) 50 MCG/ACT nasal spray Place 2 sprays into both nostrils daily. 11/12/21   Plotnikov, Georgina Quint, MD  gabapentin (NEURONTIN) 800 MG tablet Take 1 tablet (800 mg total) by mouth at bedtime for 14 days. Take nightly for 3 days, then up to 14 days as needed 08/15/22 08/29/22  Christeen Douglas, MD  ondansetron (ZOFRAN) 4 MG tablet Take 1 tablet (4 mg total) by mouth every 8 (eight) hours as needed for nausea or vomiting. 07/10/22   Plotnikov, Georgina Quint, MD  oxyCODONE (OXY IR/ROXICODONE) 5 MG immediate release tablet Take 1 tablet (5 mg total) by mouth every 4 (four) hours as needed for severe pain. 08/15/22   Christeen Douglas, MD  pantoprazole (PROTONIX) 40 MG tablet Take 1 tablet (40 mg total) by mouth daily.  07/25/22   Henson, Vickie L, NP-C  sucralfate (CARAFATE) 1 GM/10ML suspension Take 10 mLs (1 g total) by mouth 4 (four) times daily -  with meals and at bedtime. 07/25/22   Henson, Vickie L, NP-C  SUMAtriptan (IMITREX) 100 MG tablet Take one prn migraine. May repeat in 2 hours if headache persists or recurs. 05/29/22   Plotnikov, Georgina Quint, MD      Allergies    Latex, Maxalt [rizatriptan], Omnicef [cefdinir], Zithromax [azithromycin], Chlorhexidine, and Penicillins    Review of Systems   Review of Systems  Neurological:  Positive for headaches.    Physical Exam Updated Vital Signs BP 121/76   Pulse (!) 53   Temp 98.5 F (36.9 C) (Oral)   Resp 18   Wt 97.5 kg   LMP  (LMP Unknown)   SpO2 100%   BMI 33.67 kg/m  Physical Exam Vitals and nursing note reviewed.  Constitutional:      Appearance: Normal appearance.     Comments: Patient in room with lights off wearing sunglasses  HENT:     Head: Atraumatic.  Eyes:     Extraocular Movements: Extraocular movements intact.     Conjunctiva/sclera: Conjunctivae normal.     Pupils: Pupils are equal, round, and reactive to light.  Cardiovascular:     Rate and Rhythm: Normal rate and regular rhythm.     Heart sounds: No murmur heard. Pulmonary:  Effort: Pulmonary effort is normal. No respiratory distress.     Breath sounds: Normal breath sounds.  Musculoskeletal:        General: Normal range of motion.     Cervical back: Normal range of motion. No rigidity.  Skin:    General: Skin is warm and dry.  Neurological:     General: No focal deficit present.     Mental Status: She is alert.     Comments: Cranial nerves III through XII intact Patient able to ambulate on her own 5/ 5 strength in the upper and lower extremities bilaterally Sensation intact in the upper and lower extremities bilaterally  Psychiatric:        Mood and Affect: Mood normal.        Behavior: Behavior normal.     ED Results / Procedures / Treatments    Labs (all labs ordered are listed, but only abnormal results are displayed) Labs Reviewed  PREGNANCY, URINE    EKG None  Radiology CT Head Wo Contrast  Result Date: 01/14/2023 CLINICAL DATA:  Right-sided headache. EXAM: CT HEAD WITHOUT CONTRAST TECHNIQUE: Contiguous axial images were obtained from the base of the skull through the vertex without intravenous contrast. RADIATION DOSE REDUCTION: This exam was performed according to the departmental dose-optimization program which includes automated exposure control, adjustment of the mA and/or kV according to patient size and/or use of iterative reconstruction technique. COMPARISON:  None Available. FINDINGS: Brain: No evidence of acute infarction, hemorrhage, hydrocephalus, extra-axial collection or mass lesion/mass effect. Vascular: No hyperdense vessel or unexpected calcification. Skull: Normal. Negative for fracture or focal lesion. Sinuses/Orbits: No acute finding. Other: None. IMPRESSION: Normal head CT. Electronically Signed   By: Aram Candela M.D.   On: 01/14/2023 22:08    Procedures Procedures    Medications Ordered in ED Medications  acetaminophen (TYLENOL) tablet 1,000 mg (has no administration in time range)  ketorolac (TORADOL) injection 30 mg (has no administration in time range)    ED Course/ Medical Decision Making/ A&P                                 Medical Decision Making Amount and/or Complexity of Data Reviewed Labs: ordered. Radiology: ordered.  Risk Prescription drug management.   27 y.o. female with pertinent past medical history of migraines, IBS presents to the ED for concern of migraine for 5 days  Differential diagnosis includes but is not limited to headache, migraine, intracranial mass, meningitis  ED Course:  Patient with normal vital signs here today.  She presents with concern for migraine for the past couple of days.  Was told not to use over-the-counter pain medications which she  normally would have used for headache.  Was given rizatriptan and this has not helped.  She notes she has a history of migraines but it usually does not involve the right side of her head like this one does. Plan to give patient IV migraine cocktail, however patient does not want an IV.  Discussed that I can give her IM Toradol for her pain and manage pain at home with oral tylenol and ibuprofen.  She is in agreement with this plan and was given the Toradol and tylenol prior to discharge.   Impression: Migraine  Disposition:  The patient was discharged home with instructions to take Tylenol and ibuprofen for migraine.  Follow-up with primary care provider for discussion of headache prevention and further treatment. Return  precautions given.  Lab Tests: I Ordered, and personally interpreted labs.  The pertinent results include:   Pregnancy negative  Imaging Studies ordered: I ordered imaging studies including CT head  I independently visualized the imaging with scope of interpretation limited to determining acute life threatening conditions related to emergency care. Imaging showed no acute abnormalities I agree with the radiologist interpretation            Final Clinical Impression(s) / ED Diagnoses Final diagnoses:  Migraine without aura and with status migrainosus, not intractable    Rx / DC Orders ED Discharge Orders     None         Arabella Merles, Cordelia Poche 01/14/23 2244    Rondel Baton, MD 01/15/23 [phone]

## 2023-01-15 ENCOUNTER — Telehealth: Payer: Self-pay

## 2023-01-15 NOTE — Transitions of Care (Post Inpatient/ED Visit) (Signed)
   01/15/2023  Name: Hannah Ford MRN: [id number] DOB: 10-07-1995  Today's TOC FU Call Status: Today's TOC FU Call Status:: Successful TOC FU Call Completed TOC FU Call Complete Date: 01/15/23  Transition Care Management Follow-up Telephone Call Date of Discharge: 01/14/23 Discharge Facility: [facility] ([facility] Type of Discharge: Emergency Department Reason for ED Visit: Other: (migraine) How have you been since you were released from the hospital?: Same Any questions or concerns?: No  Items Reviewed: Did you receive and understand the discharge instructions provided?: Yes Medications obtained,verified, and reconciled?: Yes (Medications Reviewed) Any new allergies since your discharge?: No Dietary orders reviewed?: Yes Do you have support at home?: Yes People in Home: parent(s)  Medications Reviewed Today: Medications Reviewed Today     Reviewed by Karena Addison, LPN (Licensed Practical Nurse) on 01/15/23 at [phone]  Med List Status: <None>   Medication Order Taking? Sig Documenting Provider Last Dose Status Informant  docusate sodium (COLACE) 100 MG capsule [id number] No Take 1 capsule (100 mg total) by mouth 2 (two) times daily. To keep stools soft Christeen Douglas, MD Past Week Active   Ferrous Fumarate (IRON) 18 MG TBCR [id number] No  [provider] Past Week Active   fluticasone (FLONASE) 50 MCG/ACT nasal spray [id number] No Place 2 sprays into both nostrils daily. Plotnikov, Georgina Quint, MD Past Week Active   gabapentin (NEURONTIN) 800 MG tablet [id number]  Take 1 tablet (800 mg total) by mouth at bedtime for 14 days. Take nightly for 3 days, then up to 14 days as needed Christeen Douglas, MD  Expired 08/29/22 2359   ondansetron (ZOFRAN) 4 MG tablet [id number] No Take 1 tablet (4 mg total) by mouth every 8 (eight) hours as needed for nausea or vomiting. Plotnikov, Georgina Quint, MD Past Week Active   oxyCODONE (OXY IR/ROXICODONE) 5 MG immediate release tablet [id number] No  Take 1 tablet (5 mg total) by mouth every 4 (four) hours as needed for severe pain. Christeen Douglas, MD Past Week Active   pantoprazole (PROTONIX) 40 MG tablet [id number] No Take 1 tablet (40 mg total) by mouth daily. Henson, Vickie L, NP-C Past Week Active   sucralfate (CARAFATE) 1 GM/10ML suspension [id number] No Take 10 mLs (1 g total) by mouth 4 (four) times daily -  with meals and at bedtime. Henson, Vickie L, NP-C Past Week Active   SUMAtriptan (IMITREX) 100 MG tablet [id number] No Take one prn migraine. May repeat in 2 hours if headache persists or recurs. Plotnikov, Georgina Quint, MD Past Week Active             Home Care and Equipment/Supplies: Were Home Health Services Ordered?: NA Any new equipment or medical supplies ordered?: NA  Functional Questionnaire: Do you need assistance with bathing/showering or dressing?: No Do you need assistance with meal preparation?: No Do you need assistance with eating?: No Do you have difficulty maintaining continence: No Do you need assistance with getting out of bed/getting out of a chair/moving?: No Do you have difficulty managing or taking your medications?: No  Follow up appointments reviewed: PCP Follow-up appointment confirmed?: No (no avail appts, sent message to staff to schedule) MD Provider Line [phone] Given: No Specialist Hospital Follow-up appointment confirmed?: NA Do you need transportation to your follow-up appointment?: No Do you understand care options if your condition(s) worsen?: Yes-patient verbalized understanding    SIGNATURE Karena Addison, LPN [facility] Nurse Health Advisor Direct Dial [phone]

## 2023-02-06 ENCOUNTER — Emergency Department
Admission: EM | Admit: 2023-02-06 | Discharge: 2023-02-06 | Disposition: A | Discharge status: Home / Self Care | Payer: 59 | Attending: Emergency Medicine | Admitting: Emergency Medicine

## 2023-02-06 ENCOUNTER — Other Ambulatory Visit: Payer: Self-pay

## 2023-02-06 DIAGNOSIS — R109 Unspecified abdominal pain: Secondary | ICD-10-CM | POA: Diagnosis not present

## 2023-02-06 DIAGNOSIS — R1084 Generalized abdominal pain: Secondary | ICD-10-CM | POA: Diagnosis not present

## 2023-02-06 DIAGNOSIS — R1031 Right lower quadrant pain: Secondary | ICD-10-CM | POA: Diagnosis not present

## 2023-02-06 LAB — COMPREHENSIVE METABOLIC PANEL
ALT: 16 U/L (ref 0–44)
AST: 20 U/L (ref 15–41)
Albumin: 4.5 g/dL (ref 3.5–5.0)
Alkaline Phosphatase: 64 U/L (ref 38–126)
Anion gap: 11 (ref 5–15)
BUN: 12 mg/dL (ref 6–20)
CO2: 21 mmol/L — ABNORMAL LOW (ref 22–32)
Calcium: 9.3 mg/dL (ref 8.9–10.3)
Chloride: 107 mmol/L (ref 98–111)
Creatinine, Ser: 0.78 mg/dL (ref 0.44–1.00)
GFR, Estimated: >60 mL/min (ref >60)
Glucose, Bld: 80 mg/dL (ref 70–99)
Potassium: 3.8 mmol/L (ref 3.5–5.1)
Sodium: 139 mmol/L (ref 135–145)
Total Bilirubin: 1 mg/dL (ref 0.3–1.2)
Total Protein: 7.8 g/dL (ref 6.5–8.1)

## 2023-02-06 LAB — URINALYSIS, ROUTINE W REFLEX MICROSCOPIC
Bilirubin Urine: NEGATIVE
Glucose, UA: NEGATIVE mg/dL
Hgb urine dipstick: NEGATIVE
Ketones, ur: NEGATIVE mg/dL
Leukocytes,Ua: NEGATIVE
Nitrite: NEGATIVE
Protein, ur: NEGATIVE mg/dL
Specific Gravity, Urine: 1.014 (ref 1.005–1.030)
pH: 6 (ref 5.0–8.0)

## 2023-02-06 LAB — CBC
HCT: 39.7 % (ref 36.0–46.0)
Hemoglobin: 12.7 g/dL (ref 12.0–15.0)
MCH: 27.5 pg (ref 26.0–34.0)
MCHC: 32 g/dL (ref 30.0–36.0)
MCV: 85.9 fL (ref 80.0–100.0)
Platelets: 188 10*3/uL (ref 150–400)
RBC: 4.62 MIL/uL (ref 3.87–5.11)
RDW: 13.2 % (ref 11.5–15.5)
WBC: 5.6 10*3/uL (ref 4.0–10.5)
nRBC: 0 % (ref 0.0–0.2)

## 2023-02-06 LAB — PREGNANCY, URINE: Preg Test, Ur: NEGATIVE

## 2023-02-06 LAB — LIPASE, BLOOD: Lipase: 44 U/L (ref 11–51)

## 2023-02-06 MED ORDER — DICYCLOMINE HCL 10 MG PO CAPS: 10.0000 mg | ORAL_CAPSULE | Freq: Three times a day (TID) | ORAL | 0 refills | Status: AC | PRN

## 2023-02-06 MED ORDER — DOCUSATE SODIUM 100 MG PO CAPS: 100.0000 mg | ORAL_CAPSULE | Freq: Every day | ORAL | 2 refills | Status: AC

## 2023-02-06 NOTE — ED Triage Notes (Signed)
Pt here with RLQ pain since Wed. Pt states she has been seen for the same recently. Pt states when touched the pain is worse. Pt endorses nausea but denies VD.

## 2023-02-06 NOTE — ED Notes (Signed)
Patient states she feels better now that she knows what is going on. FAmily with patient. NAD.

## 2023-02-06 NOTE — ED Notes (Signed)
Patient walked out without discharge papers. MD aware.

## 2023-02-06 NOTE — Discharge Instructions (Addendum)
Please follow-up with your GI doctor for your ongoing abdominal discomfort.  As we discussed please take Colace once daily in addition to the Metamucil that you are already taking.  He may use Bentyl as needed for discomfort as prescribed.  Return to the emergency department for any significant worsening of abdominal pain, fever or any other symptom personally concerning to yourself.

## 2023-02-06 NOTE — ED Provider Notes (Signed)
[facility] Provider Note    Event Date/Time   First MD Initiated Contact with Patient 02/06/23 1136     (approximate)  History   Chief Complaint: Abdominal Pain  HPI  Hannah Ford is a 27 y.o. female with a past medical history of anemia, gastric reflux, IBS, presents to the emergency department for abdominal pain.  According to the patient over the past year or so she has been experiencing frequent episodes of abdominal pain.  Patient last had a CT scan in January of this year that was negative for acute finding.  Patient has been following up with GI medicine and just recently had a colonoscopy and endoscopy that were largely negative for any findings as well.  Patient states they told her that her discomfort is likely IBS related.  Patient states she has been constipated over the past 3 to 4 days and feels like the abdominal pain has worsened.  Patient denies any fever states nausea at times but denies any vomiting.  No urinary symptoms.  Physical Exam   Triage Vital Signs: ED Triage Vitals [02/06/23 1044]  Encounter Vitals Group     BP 125/87     Systolic BP Percentile      Diastolic BP Percentile      Pulse Rate 93     Resp 18     Temp 98.5 F (36.9 C)     Temp Source Oral     SpO2 100 %     Weight 214 lb 15.2 oz (97.5 kg)     Height 5\' 7"  (1.702 m)     Head Circumference      Peak Flow      Pain Score 10     Pain Loc      Pain Education      Exclude from Growth Chart     Most recent vital signs: Vitals:   02/06/23 1044  BP: 125/87  Pulse: 93  Resp: 18  Temp: 98.5 F (36.9 C)  SpO2: 100%    General: Awake, no distress.  CV:  Good peripheral perfusion.  Regular rate and rhythm  Resp:  Normal effort.  Equal breath sounds bilaterally.  Abd:  No distention.  Soft, mild diffuse tenderness without focal tenderness identified or significant tenderness.  No rebound or guarding.  ED Results / Procedures / Treatments   MEDICATIONS ORDERED  IN ED: Medications - No data to display   IMPRESSION / MDM / ASSESSMENT AND PLAN / ED COURSE  I reviewed the triage vital signs and the nursing notes.  Patient's presentation is most consistent with acute presentation with potential threat to life or bodily function.  Patient presents to the emergency department for abdominal pain.  Patient states a history of the same has been following up with GI medicine recently had a colonoscopy and endoscopy for the same.  I reviewed the patient's last CT scan of January of this year which showed no significant findings.  Patient's lab work today is reassuring with a normal CBC normal CMP including LFTs and normal lipase.  We will check a urine sample as a precaution.  I highly suspect the patient's symptoms are IBS related.  Given the patient's constipation she states she has been taking Metamucil we will add Colace once daily.  We will also prescribe Bentyl to be used if needed for discomfort.  Patient is agreeable to this plan of care and will follow-up with her GI doctor.  Urinalysis is normal.  We will discharge with GI follow-up.  Patient agreeable to plan of care.  FINAL CLINICAL IMPRESSION(S) / ED DIAGNOSES   Abdominal pain   Rx / DC Orders   Colace once daily in addition to her Metamucil Bentyl  Note:  This document was prepared using Dragon voice recognition software and may include unintentional dictation errors.   Minna Antis, MD 02/06/23 1321

## 2023-02-12 ENCOUNTER — Encounter: Payer: 59 | Admitting: Physical Therapy

## 2023-02-19 ENCOUNTER — Encounter: Payer: 59 | Admitting: Physical Therapy

## 2023-02-25 NOTE — Therapy (Incomplete)
OUTPATIENT PHYSICAL THERAPY FEMALE PELVIC EVALUATION   Patient Name: Hannah Ford MRN: [id number] DOB:1996/03/09, 27 y.o., female Today's Date: 02/25/2023  END OF SESSION:   Past Medical History:  Diagnosis Date   Abnormal menstrual periods    Allergy    Anemia    Endometriosis    Frequent headaches    otc med prn   GERD (gastroesophageal reflux disease)    Urticaria    Past Surgical History:  Procedure Laterality Date   COLONOSCOPY WITH PROPOFOL N/A 10/09/2022   Procedure: COLONOSCOPY WITH PROPOFOL;  Surgeon: [name], DO;  Location: [facility] ENDOSCOPY;  Service: Endoscopy;  Laterality: N/A;   ESOPHAGOGASTRODUODENOSCOPY (EGD) WITH PROPOFOL N/A 10/09/2022   Procedure: ESOPHAGOGASTRODUODENOSCOPY (EGD) WITH PROPOFOL;  Surgeon: [name], DO;  Location: [facility] ENDOSCOPY;  Service: Endoscopy;  Laterality: N/A;   INTRAUTERINE DEVICE (IUD) INSERTION N/A 08/15/2022   Procedure: INTRAUTERINE DEVICE (IUD) INSERTION, MIRENA;  Surgeon: Christeen Douglas, MD;  Location: [facility] ORS;  Service: Gynecology;  Laterality: N/A;   LAPAROSCOPY N/A 02/14/2016   Procedure: LAPAROSCOPY DIAGNOSTIC FULGERATION OF PELVIC ENDOMETRIOSIS;  Surgeon: Maxie Better, MD;  Location: [facility] ORS;  Service: Gynecology;  Laterality: N/A;  @ 2hrs.   ROBOTIC ASSISTED LAPAROSCOPIC LYSIS OF ADHESION  08/15/2022   Procedure: XI ROBOTIC ASSISTED LAPAROSCOPIC LYSIS OF ADHESION;  Surgeon: Leafy Ro, MD;  Location: [facility] ORS;  Service: General;;  WITH REPAIR MESENTERY   WISDOM TOOTH EXTRACTION     XI ROBOT ASSISTED DIAGNOSTIC LAPAROSCOPY N/A 08/15/2022   Procedure: XI ROBOT ASSISTED OPERATIVE LAPAROSCOPY, EXCISION OF ENDOMETRIOSIS, PERITONEAL BIOPSIES LYSIS OF ADHESIONS, PERITONEAL STRIPPING;  Surgeon: Christeen Douglas, MD;  Location: [facility] ORS;  Service: Gynecology;  Laterality: N/A;   Patient Active Problem List   Diagnosis Date Noted   Abdominal pain 07/13/2022   Hematochezia 07/10/2022   Endometriosis  07/10/2022   Well adult exam 05/29/2022   Migraine 11/12/2021   Allergic rhinitis 11/12/2021   Neck pain 11/12/2021   Eczema 05/27/2021   Acute sinusitis 05/27/2021   Contact with other sharp object(s), not elsewhere classified, initial encounter 01/29/2021   Unspecified open wound, right lower leg, initial encounter 01/27/2021   Chronic cough 06/12/2019   Acute left ankle pain 01/18/2018   Upper respiratory infection 10/20/2017   Tonsillith 09/24/2016   Hematuria 05/21/2016   Back pain 05/21/2016   GERD (gastroesophageal reflux disease) 11/19/2015   ANA positive 08/24/2015   Chronic midline low back pain with bilateral sciatica 08/24/2015   Malar rash 08/24/2015   Vitamin D deficiency 08/24/2015    PCP: Plotnikov, Georgina Quint, MD  REFERRING PROVIDER: Louellen Molder, NP   REFERRING DIAG:  R10.30 (ICD-10-CM) - Lower abdominal pain, unspecified  R14.0 (ICD-10-CM) - Abdominal distension (gaseous)    THERAPY DIAG:  No diagnosis found.  Rationale for Evaluation and Treatment: Rehabilitation  ONSET DATE: ***  SUBJECTIVE:  SUBJECTIVE STATEMENT: *** Fluid intake: {Yes/No:304960894}   PAIN:  Are you having pain? {yes/no:20286} NPRS scale: ***/10 Pain location: {pelvic pain location:27098}  Pain type: {type:313116} Pain description: {PAIN DESCRIPTION:21022940}   Aggravating factors: *** Relieving factors: ***  PRECAUTIONS: {Therapy precautions:24002}  RED FLAGS: {PT Red Flags:29287}   WEIGHT BEARING RESTRICTIONS: {Yes ***/No:24003}  FALLS:  Has patient fallen in last 6 months? {fallsyesno:27318}  LIVING ENVIRONMENT: Lives with: {[facility] lives with:25569::"lives with their family"} Lives in: {Lives in:25570} Stairs: {opstairs:27293} Has following equipment at home:  {Assistive devices:23999}  OCCUPATION: ***  PLOF: {PLOF:24004}  PATIENT GOALS: ***  PERTINENT HISTORY:  *** Sexual abuse: {Yes/No:304960894}  BOWEL MOVEMENT: Pain with bowel movement: {yes/no:20286} Type of bowel movement:{PT BM type:27100} Fully empty rectum: {Yes/No:304960894} Leakage: {Yes/No:304960894} Pads: {Yes/No:304960894} Fiber supplement: {Yes/No:304960894}  URINATION: Pain with urination: {yes/no:20286} Fully empty bladder: {Yes/No:304960894} Stream: {PT urination:27102} Urgency: {Yes/No:304960894} Frequency: *** Leakage: {PT leakage:27103} Pads: {Yes/No:304960894}  INTERCOURSE: Pain with intercourse: {pain with intercourse PA:27099} Ability to have vaginal penetration:  {Yes/No:304960894} Climax: *** Marinoff Scale: ***/3  PREGNANCY: Vaginal deliveries *** Tearing {Yes***/No:304960894} C-section deliveries *** Currently pregnant {Yes***/No:304960894}  PROLAPSE: {PT prolapse:27101}   OBJECTIVE:   DIAGNOSTIC FINDINGS:  ***  PATIENT SURVEYS:  {rehab surveys:24030}  PFIQ-7 ***  COGNITION: Overall cognitive status: {cognition:24006}     SENSATION: Light touch: {intact/deficits:24005} Proprioception: {intact/deficits:24005}  MUSCLE LENGTH: Hamstrings: Right *** deg; Left *** deg Thomas test: Right *** deg; Left *** deg  LUMBAR SPECIAL TESTS:  {lumbar special test:25242}  FUNCTIONAL TESTS:  {Functional tests:24029}  GAIT: Distance walked: *** Assistive device utilized: {Assistive devices:23999} Level of assistance: {Levels of assistance:24026} Comments: ***  POSTURE: {posture:25561}  PELVIC ALIGNMENT:  LUMBARAROM/PROM:  A/PROM A/PROM  eval  Flexion   Extension   Right lateral flexion   Left lateral flexion   Right rotation   Left rotation    (Blank rows = not tested)  LOWER EXTREMITY ROM:  {AROM/PROM:27142} ROM Right eval Left eval  Hip flexion    Hip extension    Hip abduction    Hip adduction    Hip internal  rotation    Hip external rotation    Knee flexion    Knee extension    Ankle dorsiflexion    Ankle plantarflexion    Ankle inversion    Ankle eversion     (Blank rows = not tested)  LOWER EXTREMITY MMT:  MMT Right eval Left eval  Hip flexion    Hip extension    Hip abduction    Hip adduction    Hip internal rotation    Hip external rotation    Knee flexion    Knee extension    Ankle dorsiflexion    Ankle plantarflexion    Ankle inversion    Ankle eversion     PALPATION:   General  ***                External Perineal Exam ***                             Internal Pelvic Floor ***  Patient confirms identification and approves PT to assess internal pelvic floor and treatment {yes/no:20286}  PELVIC MMT:   MMT eval  Vaginal   Internal Anal Sphincter   External Anal Sphincter   Puborectalis   Diastasis Recti   (Blank rows = not tested)        TONE: ***  PROLAPSE: ***  TODAY'S TREATMENT:  DATE: ***  EVAL ***   PATIENT EDUCATION:  Education details: *** Person educated: {Person educated:25204} Education method: {Education Method:25205} Education comprehension: {Education Comprehension:25206}  HOME EXERCISE PROGRAM: ***  ASSESSMENT:  CLINICAL IMPRESSION: Patient is a *** y.o. *** who was seen today for physical therapy evaluation and treatment for ***.   OBJECTIVE IMPAIRMENTS: {opptimpairments:25111}.   ACTIVITY LIMITATIONS: {activitylimitations:27494}  PARTICIPATION LIMITATIONS: {participationrestrictions:25113}  PERSONAL FACTORS: {Personal factors:25162} are also affecting patient's functional outcome.   REHAB POTENTIAL: {rehabpotential:25112}  CLINICAL DECISION MAKING: {clinical decision making:25114}  EVALUATION COMPLEXITY: {Evaluation complexity:25115}   GOALS: Goals reviewed with patient?  {yes/no:20286}  SHORT TERM GOALS: Target date: ***  *** Baseline: Goal status: INITIAL  2.  *** Baseline:  Goal status: INITIAL  3.  *** Baseline:  Goal status: INITIAL  4.  *** Baseline:  Goal status: INITIAL  5.  *** Baseline:  Goal status: INITIAL  6.  *** Baseline:  Goal status: INITIAL  LONG TERM GOALS: Target date: ***  *** Baseline:  Goal status: INITIAL  2.  *** Baseline:  Goal status: INITIAL  3.  *** Baseline:  Goal status: INITIAL  4.  *** Baseline:  Goal status: INITIAL  5.  *** Baseline:  Goal status: INITIAL  6.  *** Baseline:  Goal status: INITIAL  PLAN:  PT FREQUENCY: {rehab frequency:25116}  PT DURATION: {rehab duration:25117}  PLANNED INTERVENTIONS: {rehab planned interventions:25118::"Therapeutic exercises","Therapeutic activity","Neuromuscular re-education","Balance training","Gait training","Patient/Family education","Self Care","Joint mobilization"}  PLAN FOR NEXT SESSION: ***   Messi Twedt, PT 02/25/2023, 11:07 AM

## 2023-02-25 NOTE — Therapy (Unsigned)
OUTPATIENT PHYSICAL THERAPY FEMALE PELVIC EVALUATION   Patient Name: Hannah Ford MRN: [id number] DOB:12-Aug-1995, 27 y.o., female Today's Date: 02/26/2023  END OF SESSION:  PT End of Session - 02/26/23 1458     Visit Number 1    Date for PT Re-Evaluation 06/18/23    Authorization Type [employer]    PT Start Time 1400    PT Stop Time 1445    PT Time Calculation (min) 45 min    Activity Tolerance Patient tolerated treatment well    Behavior During Therapy WFL for tasks assessed/performed             Past Medical History:  Diagnosis Date   Abnormal menstrual periods    Allergy    Anemia    Endometriosis    Frequent headaches    otc med prn   GERD (gastroesophageal reflux disease)    Urticaria    Past Surgical History:  Procedure Laterality Date   COLONOSCOPY WITH PROPOFOL N/A 10/09/2022   Procedure: COLONOSCOPY WITH PROPOFOL;  Surgeon: [name], DO;  Location: [facility] ENDOSCOPY;  Service: Endoscopy;  Laterality: N/A;   ESOPHAGOGASTRODUODENOSCOPY (EGD) WITH PROPOFOL N/A 10/09/2022   Procedure: ESOPHAGOGASTRODUODENOSCOPY (EGD) WITH PROPOFOL;  Surgeon: [name], DO;  Location: [facility] ENDOSCOPY;  Service: Endoscopy;  Laterality: N/A;   INTRAUTERINE DEVICE (IUD) INSERTION N/A 08/15/2022   Procedure: INTRAUTERINE DEVICE (IUD) INSERTION, MIRENA;  Surgeon: Christeen Douglas, MD;  Location: [facility] ORS;  Service: Gynecology;  Laterality: N/A;   LAPAROSCOPY N/A 02/14/2016   Procedure: LAPAROSCOPY DIAGNOSTIC FULGERATION OF PELVIC ENDOMETRIOSIS;  Surgeon: Maxie Better, MD;  Location: [facility] ORS;  Service: Gynecology;  Laterality: N/A;  @ 2hrs.   ROBOTIC ASSISTED LAPAROSCOPIC LYSIS OF ADHESION  08/15/2022   Procedure: XI ROBOTIC ASSISTED LAPAROSCOPIC LYSIS OF ADHESION;  Surgeon: Leafy Ro, MD;  Location: [facility] ORS;  Service: General;;  WITH REPAIR MESENTERY   WISDOM TOOTH EXTRACTION     XI ROBOT ASSISTED DIAGNOSTIC LAPAROSCOPY N/A 08/15/2022   Procedure: XI ROBOT  ASSISTED OPERATIVE LAPAROSCOPY, EXCISION OF ENDOMETRIOSIS, PERITONEAL BIOPSIES LYSIS OF ADHESIONS, PERITONEAL STRIPPING;  Surgeon: Christeen Douglas, MD;  Location: [facility] ORS;  Service: Gynecology;  Laterality: N/A;   Patient Active Problem List   Diagnosis Date Noted   Abdominal pain 07/13/2022   Hematochezia 07/10/2022   Endometriosis 07/10/2022   Well adult exam 05/29/2022   Migraine 11/12/2021   Allergic rhinitis 11/12/2021   Neck pain 11/12/2021   Eczema 05/27/2021   Acute sinusitis 05/27/2021   Contact with other sharp object(s), not elsewhere classified, initial encounter 01/29/2021   Unspecified open wound, right lower leg, initial encounter 01/27/2021   Chronic cough 06/12/2019   Acute left ankle pain 01/18/2018   Upper respiratory infection 10/20/2017   Tonsillith 09/24/2016   Hematuria 05/21/2016   Back pain 05/21/2016   GERD (gastroesophageal reflux disease) 11/19/2015   ANA positive 08/24/2015   Chronic midline low back pain with bilateral sciatica 08/24/2015   Malar rash 08/24/2015   Vitamin D deficiency 08/24/2015    PCP: Plotnikov, Georgina Quint, MD  REFERRING PROVIDER: Louellen Molder, NP   REFERRING DIAG:  R10.30 (ICD-10-CM) - Lower abdominal pain, unspecified  R14.0 (ICD-10-CM) - Abdominal distension (gaseous)    THERAPY DIAG:  Other muscle spasm - Plan: PT plan of care cert/re-cert  Pelvic pain - Plan: PT plan of care cert/re-cert  Generalized abdominal pain - Plan: PT plan of care cert/re-cert  Rationale for Evaluation and Treatment: Rehabilitation  ONSET DATE: 1/24  SUBJECTIVE:  SUBJECTIVE STATEMENT: Patient started to have lower abdominal pain that would radiate to side of lower back, Pelvic pain and pressure with using the bathroom. 2/24 started to have GI  issues. I have pressure with urinating. Past with endometriosis. Some on ovaries and bowels. Patient has IUD. Walks for 30 minutes gets out of breath due to pressure on the chest.  Fluid intake: Yes: water, sparkling water, flavored packet, drinks coffee to help to have a bowel movements    PAIN:  Are you having pain? Yes NPRS scale: 5/10 when she eats, 9/10 when eats  Pain location:  lower abdominals  Pain type: aching and throbbing, eating is sharp Pain description: intermittent   Aggravating factors: eating Relieving factors: not eating  PRECAUTIONS: None  RED FLAGS: None   WEIGHT BEARING RESTRICTIONS: No  FALLS:  Has patient fallen in last 6 months? No  LIVING ENVIRONMENT: Lives with: lives with their family  OCCUPATION: sitting job with [employer]  PLOF: Independent  PATIENT GOALS: relief of pressure  PERTINENT HISTORY:  Diagnostic laparoscopy 2017, 2024; endometriosis; IBS Sexual abuse: No  BOWEL MOVEMENT: Pain with bowel movement: Yes pressure 6/10 Type of bowel movement:Type (Bristol Stool Scale) Type 5-6 in longer pieces that are fluffy, Frequency 2-3 days without coffee, daily with coffee, and Strain Yes when not having coffee Fully empty rectum: Yes:   Leakage: No Pads: No Fiber supplement: Yes: figuring out which one  URINATION: Pain with urination: Yes 5/10 Fully empty bladder: No, feels like she still needs to [facility]:  start and stop Urgency: Yes: limited time to urinate Frequency: every 1-2 hours, night gets up 1-2 times Leakage: Urge to void Pads: Yes: , 2 pads  INTERCOURSE:Not    PREGNANCY:None     OBJECTIVE:    COGNITION: Overall cognitive status: Within functional limits for tasks assessed     SENSATION: Light touch: Appears intact Proprioception: Appears intact   POSTURE: No Significant postural limitations  PELVIC ALIGNMENT:  LUMBARAROM/PROM: lumbar ROM is full   LOWER EXTREMITY ROM: bilateral hip ROM is full   LOWER  EXTREMITY MMT:  MMT Right eval Left eval  Hip extension 5/5 4/5  Hip abduction 4/5 4/5   PALPATION:   General  tenderness located on lateral left gluteal, left levator ani, bilateral hip adduction, throughout the abdomen; surgical side suprapubic decreased mobility                External Perineal Exam tenderness located in the ischiocavernosus, bulbocavernosus, perineal body, ; Q-tip test: 7, 5, 6                             Internal Pelvic Floor pain when inserted Q-tip into the vaginal canal  Patient confirms identification and approves PT to assess internal pelvic floor and treatment Yes  PELVIC MMT:   MMT eval  Vaginal No assessment due to patient pain level  Internal Anal Sphincter   External Anal Sphincter   Puborectalis   Diastasis Recti   (Blank rows = not tested)        TONE: increased  PROLAPSE: Not able to assess  TODAY'S TREATMENT:  DATE: 02/26/23  EVAL see below   PATIENT EDUCATION:  Education details: You tube videos for meditation, body scanning and hip stretches Person educated: Patient Education method: Explanation and you tube videos Education comprehension: verbalized understanding  HOME EXERCISE PROGRAM: See above.   ASSESSMENT:  CLINICAL IMPRESSION: Patient is a 28 y.o. female who was seen today for physical therapy evaluation and treatment for lower abdominal pain. Patient reports her lower abdominal pain began 1/24. She had laparoscopic surgery removing endometriosis around the ovaries and bowel. She is having abdominal pain at 5/10 when she does not eat and 9/10 when she eats. Pelvic pressure with urinating and bowel movements is 6/10. She has to strain to have a bowel movement at times. She keeps her stools regular with drinking coffee. Patient urine stream is stop and go.   Patient will leak urine with the urge to  urinate and has limited time to get to the bathroom. She has tenderness throughout her abdomen, along the hip adductors, left gluteal, ischiocavernosus, bulbocavernosus, and perineal body. Q-tip test is tenderness at 7, 5, and 6 O'clock. Surgical site suprapuically is limited. She has increased pain with vaginal exams. Patient will benefit from skilled therapy to reduce her pain and improve her quality of life.   OBJECTIVE IMPAIRMENTS: decreased activity tolerance, decreased coordination, decreased endurance, decreased strength, increased edema, increased fascial restrictions, increased muscle spasms, impaired tone, and pain.   ACTIVITY LIMITATIONS: continence, toileting, and locomotion level  PARTICIPATION LIMITATIONS: community activity  PERSONAL FACTORS: Time since onset of injury/illness/exacerbation and 1-2 comorbidities: Diagnostic laparoscopy 2017, 2024; endometriosis; IBS  are also affecting patient's functional outcome.   REHAB POTENTIAL: Excellent  CLINICAL DECISION MAKING: Evolving/moderate complexity  EVALUATION COMPLEXITY: Moderate   GOALS: Goals reviewed with patient? Yes  SHORT TERM GOALS: Target date: 03/25/23  Patient independent with HEP for pain management.  Baseline: Goal status: INITIAL  2.  Patient educated on scar and abdominal massage to reduce tenderness Baseline:  Goal status: INITIAL  3.  Patient is able to have therapist perform manual work to the pelvic floor muscles internally for reduction of tender areas Baseline:  Goal status: INITIAL  4.  Education on how to have a bowel movement with relaxation of the pelvic floor and pushing the stool out.  Baseline:  Goal status: INITIAL   LONG TERM GOALS: Target date: 07/08/23  Patient independent with advanced HEP to strengthen the core and pelvic floor while fully relaxing the muscles too.  Baseline:  Goal status: INITIAL  2.  Patient reports her pressure pain with urinating and bowel movements  decreased >/= 2/10 due to the ability to relax the pelvic floor.  Baseline:  Goal status: INITIAL  3.  Patient able to walk to the bathroom when she has the urge to urinate and not leak urine due to improve strength >/= 3/5.  Baseline:  Goal status: INITIAL  4.  Patient is able to have the therapist perform internal manual work to the pelvic floor muscles with pain level ,/= 1-2/10 to facilitate an exam by the MD.  Baseline:  Goal status: INITIAL  5.  Patient is able to have a bowel movement without straining due to improved relaxation of the pelvic floor muscles.  Baseline:  Goal status: INITIAL   PLAN:  PT FREQUENCY: 1x/week  PT DURATION: other: 4 months  PLANNED INTERVENTIONS: Therapeutic exercises, Therapeutic activity, Neuromuscular re-education, Patient/Family education, Joint mobilization, Dry Needling, Electrical stimulation, Cryotherapy, Moist heat, scar mobilization, Ultrasound, Biofeedback, and Manual therapy  PLAN FOR NEXT SESSION: manual work to the abdomen, scar, diaphragmatic breathing, how to massage the perineum with using a ball   Eulis Foster, PT 02/26/23 5:00 PM

## 2023-02-26 ENCOUNTER — Encounter: Payer: 59 | Attending: Gastroenterology | Admitting: Physical Therapy

## 2023-02-26 ENCOUNTER — Encounter: Payer: Self-pay | Admitting: Physical Therapy

## 2023-02-26 ENCOUNTER — Other Ambulatory Visit: Payer: Self-pay

## 2023-02-26 DIAGNOSIS — R1030 Lower abdominal pain, unspecified: Secondary | ICD-10-CM | POA: Insufficient documentation

## 2023-02-26 DIAGNOSIS — R140 Abdominal distension (gaseous): Secondary | ICD-10-CM | POA: Insufficient documentation

## 2023-02-26 DIAGNOSIS — R102 Pelvic and perineal pain: Secondary | ICD-10-CM | POA: Diagnosis not present

## 2023-02-26 DIAGNOSIS — R1084 Generalized abdominal pain: Secondary | ICD-10-CM

## 2023-02-26 DIAGNOSIS — M62838 Other muscle spasm: Secondary | ICD-10-CM | POA: Diagnosis not present

## 2023-03-05 ENCOUNTER — Encounter: Payer: 59 | Admitting: Physical Therapy

## 2023-05-05 ENCOUNTER — Encounter: Payer: Self-pay | Admitting: Physical Therapy

## 2023-05-05 ENCOUNTER — Encounter: Payer: 59 | Attending: Gastroenterology | Admitting: Physical Therapy

## 2023-05-05 DIAGNOSIS — R102 Pelvic and perineal pain: Secondary | ICD-10-CM | POA: Insufficient documentation

## 2023-05-05 DIAGNOSIS — R1084 Generalized abdominal pain: Secondary | ICD-10-CM | POA: Diagnosis not present

## 2023-05-05 DIAGNOSIS — R1030 Lower abdominal pain, unspecified: Secondary | ICD-10-CM | POA: Diagnosis not present

## 2023-05-05 DIAGNOSIS — M62838 Other muscle spasm: Secondary | ICD-10-CM | POA: Diagnosis not present

## 2023-05-05 DIAGNOSIS — N809 Endometriosis, unspecified: Secondary | ICD-10-CM | POA: Diagnosis not present

## 2023-05-05 DIAGNOSIS — R140 Abdominal distension (gaseous): Secondary | ICD-10-CM | POA: Diagnosis not present

## 2023-05-05 DIAGNOSIS — K589 Irritable bowel syndrome without diarrhea: Secondary | ICD-10-CM | POA: Diagnosis not present

## 2023-05-05 NOTE — Therapy (Signed)
OUTPATIENT PHYSICAL THERAPY FEMALE PELVIC TREATMENT   Patient Name: Hannah Ford MRN: [id number] DOB:[birth year], 27 y.o., female Today's Date: 05/05/2023  END OF SESSION:  PT End of Session - 05/05/23 1403     Visit Number 2    Date for PT Re-Evaluation 06/18/23    Authorization Type [employer]    PT Start Time 1400    PT Stop Time 1445    PT Time Calculation (min) 45 min    Activity Tolerance Patient tolerated treatment well    Behavior During Therapy WFL for tasks assessed/performed             Past Medical History:  Diagnosis Date   Abnormal menstrual periods    Allergy    Anemia    Endometriosis    Frequent headaches    otc med prn   GERD (gastroesophageal reflux disease)    Urticaria    Past Surgical History:  Procedure Laterality Date   COLONOSCOPY WITH PROPOFOL N/A 10/09/2022   Procedure: COLONOSCOPY WITH PROPOFOL;  Surgeon: [name], DO;  Location: [facility] ENDOSCOPY;  Service: Endoscopy;  Laterality: N/A;   ESOPHAGOGASTRODUODENOSCOPY (EGD) WITH PROPOFOL N/A 10/09/2022   Procedure: ESOPHAGOGASTRODUODENOSCOPY (EGD) WITH PROPOFOL;  Surgeon: [name], DO;  Location: [facility] ENDOSCOPY;  Service: Endoscopy;  Laterality: N/A;   INTRAUTERINE DEVICE (IUD) INSERTION N/A 08/15/2022   Procedure: INTRAUTERINE DEVICE (IUD) INSERTION, MIRENA;  Surgeon: Christeen Douglas, MD;  Location: [facility] ORS;  Service: Gynecology;  Laterality: N/A;   LAPAROSCOPY N/A 02/14/2016   Procedure: LAPAROSCOPY DIAGNOSTIC FULGERATION OF PELVIC ENDOMETRIOSIS;  Surgeon: Maxie Better, MD;  Location: [facility] ORS;  Service: Gynecology;  Laterality: N/A;  @ 2hrs.   ROBOTIC ASSISTED LAPAROSCOPIC LYSIS OF ADHESION  08/15/2022   Procedure: XI ROBOTIC ASSISTED LAPAROSCOPIC LYSIS OF ADHESION;  Surgeon: Leafy Ro, MD;  Location: [facility] ORS;  Service: General;;  WITH REPAIR MESENTERY   WISDOM TOOTH EXTRACTION     XI ROBOT ASSISTED DIAGNOSTIC LAPAROSCOPY N/A 08/15/2022   Procedure: XI ROBOT  ASSISTED OPERATIVE LAPAROSCOPY, EXCISION OF ENDOMETRIOSIS, PERITONEAL BIOPSIES LYSIS OF ADHESIONS, PERITONEAL STRIPPING;  Surgeon: Christeen Douglas, MD;  Location: [facility] ORS;  Service: Gynecology;  Laterality: N/A;   Patient Active Problem List   Diagnosis Date Noted   Abdominal pain 07/13/2022   Hematochezia 07/10/2022   Endometriosis 07/10/2022   Well adult exam 05/29/2022   Migraine 11/12/2021   Allergic rhinitis 11/12/2021   Neck pain 11/12/2021   Eczema 05/27/2021   Acute sinusitis 05/27/2021   Contact with other sharp object(s), not elsewhere classified, initial encounter 01/29/2021   Unspecified open wound, right lower leg, initial encounter 01/27/2021   Chronic cough 06/12/2019   Acute left ankle pain 01/18/2018   Upper respiratory infection 10/20/2017   Tonsillith 09/24/2016   Hematuria 05/21/2016   Back pain 05/21/2016   GERD (gastroesophageal reflux disease) 11/19/2015   ANA positive 08/24/2015   Chronic midline low back pain with bilateral sciatica 08/24/2015   Malar rash 08/24/2015   Vitamin D deficiency 08/24/2015    PCP: Plotnikov, Georgina Quint, MD  REFERRING PROVIDER: Louellen Molder, NP   REFERRING DIAG:  R10.30 (ICD-10-CM) - Lower abdominal pain, unspecified  R14.0 (ICD-10-CM) - Abdominal distension (gaseous)    THERAPY DIAG:  Other muscle spasm  Pelvic pain  Generalized abdominal pain  Rationale for Evaluation and Treatment: Rehabilitation  ONSET DATE: 1/24  SUBJECTIVE:  SUBJECTIVE STATEMENT: I have been doing the videos. My hips are tight. I have been walking. Still having pressure with urination. I am having some constipation. The leakage is worse. Less pain since last visit.   Fluid intake: Yes: water, sparkling water, flavored packet, drinks coffee to  help to have a bowel movements    PAIN:  Are you having pain? Yes NPRS scale: 5/10 when she eats Pain location:  lower abdominals  Pain type: aching and throbbing, eating is sharp Pain description: intermittent   Aggravating factors: eating Relieving factors: not eating  PRECAUTIONS: None  RED FLAGS: None   WEIGHT BEARING RESTRICTIONS: No  FALLS:  Has patient fallen in last 6 months? No  LIVING ENVIRONMENT: Lives with: lives with their family  OCCUPATION: sitting job with [employer]  PLOF: Independent  PATIENT GOALS: relief of pressure  PERTINENT HISTORY:  Diagnostic laparoscopy 2017, 2024; endometriosis; IBS Sexual abuse: No  BOWEL MOVEMENT: Pain with bowel movement: Yes pressure 6/10 Type of bowel movement:Type (Bristol Stool Scale) Type 5-6 in longer pieces that are fluffy, Frequency 2-3 days without coffee, daily with coffee, and Strain Yes when not having coffee Fully empty rectum: Yes:   Leakage: No Pads: No Fiber supplement: Yes: figuring out which one  URINATION: Pain with urination: Yes 5/10 Fully empty bladder: No, feels like she still needs to [facility]:  start and stop Urgency: Yes: limited time to urinate Frequency: every 1-2 hours, night gets up 1-2 times Leakage: Urge to void Pads: Yes: , 2 pads  INTERCOURSE:Not    PREGNANCY:None     OBJECTIVE:    COGNITION: Overall cognitive status: Within functional limits for tasks assessed     SENSATION: Light touch: Appears intact Proprioception: Appears intact   POSTURE: No Significant postural limitations  PELVIC ALIGNMENT:  LUMBARAROM/PROM: lumbar ROM is full   LOWER EXTREMITY ROM: bilateral hip ROM is full   LOWER EXTREMITY MMT:  MMT Right eval Left eval  Hip extension 5/5 4/5  Hip abduction 4/5 4/5   PALPATION:   General  tenderness located on lateral left gluteal, left levator ani, bilateral hip adduction, throughout the abdomen; surgical side suprapubic decreased mobility                 External Perineal Exam tenderness located in the ischiocavernosus, bulbocavernosus, perineal body, ; Q-tip test: 7, 5, 6                             Internal Pelvic Floor pain when inserted Q-tip into the vaginal canal  Patient confirms identification and approves PT to assess internal pelvic floor and treatment Yes  PELVIC MMT:   MMT eval  Vaginal No assessment due to patient pain level  Internal Anal Sphincter   External Anal Sphincter   Puborectalis   Diastasis Recti   (Blank rows = not tested)        TONE: increased  PROLAPSE: Not able to assess  TODAY'S TREATMENT:        05/05/23 Manual: Soft tissue mobilization: Circular massage to the abdomen to promote peristalic motion of the intestines and educated patient on how to perform at home Manual work around the umbilicus to elongate the scar tissue Manual work to the diaphragm to lengthen the tissue Scar tissue mobilization: Manual work to the scar above the pubic bone going through the restrictions and showing patient on how to mobilize it Manual work to the scars on the lateral abdomen  to go through the restrictions Myofascial release: Tissue rolling of the lower rib cage and around the abdomen Fascial release of the abdomen to go through the restrictions and elongate the tissue Neuromuscular re-education: Down training: Diaphragmatic breathing to lengthen the diaphragm and pelvic floor                                                                                                                         PATIENT EDUCATION: 05/05/23 Education details: Access Code: [email or web address], education on scar massage Person educated: Patient Education method: Explanation, Demonstration, Tactile cues, Verbal cues, and Handouts Education comprehension: verbalized understanding, returned demonstration, verbal cues required, tactile cues required, and needs further education   HOME EXERCISE PROGRAM: 05/05/23 Access Code:  [email or web address] URL: [email or web address] Date: 05/05/2023 Prepared by: Eulis Foster  Exercises - Supine Diaphragmatic Breathing  - 2 x daily - 7 x weekly - 1 sets - 10 reps - Supine Abdominal Wall Massage  - 1 x daily - 7 x weekly - 3 sets - 10 reps  ASSESSMENT:  CLINICAL IMPRESSION: Patient is a 27 y.o. female who was seen today for physical therapy  treatment for lower abdominal pain.  Patient is having less pain since the initial HEP doing her HEP. She has noticed increased in urinary leakage. Patient had decreased swelling of the abdomen after manual work. Patient will benefit from skilled therapy to reduce her pain and improve her quality of life.   OBJECTIVE IMPAIRMENTS: decreased activity tolerance, decreased coordination, decreased endurance, decreased strength, increased edema, increased fascial restrictions, increased muscle spasms, impaired tone, and pain.   ACTIVITY LIMITATIONS: continence, toileting, and locomotion level  PARTICIPATION LIMITATIONS: community activity  PERSONAL FACTORS: Time since onset of injury/illness/exacerbation and 1-2 comorbidities: Diagnostic laparoscopy 2017, 2024; endometriosis; IBS  are also affecting patient's functional outcome.   REHAB POTENTIAL: Excellent  CLINICAL DECISION MAKING: Evolving/moderate complexity  EVALUATION COMPLEXITY: Moderate   GOALS: Goals reviewed with patient? Yes  SHORT TERM GOALS: Target date: 03/25/23  Patient independent with HEP for pain management.  Baseline: Goal status: INITIAL  2.  Patient educated on scar and abdominal massage to reduce tenderness Baseline:  Goal status: Met 05/05/23  3.  Patient is able to have therapist perform manual work to the pelvic floor muscles internally for reduction of tender areas Baseline:  Goal status: INITIAL  4.  Education on how to have a bowel movement with relaxation of the pelvic floor and pushing the stool out.  Baseline:  Goal status:  INITIAL   LONG TERM GOALS: Target date: 07/08/23  Patient independent with advanced HEP to strengthen the core and pelvic floor while fully relaxing the muscles too.  Baseline:  Goal status: INITIAL  2.  Patient reports her pressure pain with urinating and bowel movements decreased >/= 2/10 due to the ability to relax the pelvic floor.  Baseline:  Goal status: INITIAL  3.  Patient able to walk to the bathroom when she has the urge to urinate and not  leak urine due to improve strength >/= 3/5.  Baseline:  Goal status: INITIAL  4.  Patient is able to have the therapist perform internal manual work to the pelvic floor muscles with pain level ,/= 1-2/10 to facilitate an exam by the MD.  Baseline:  Goal status: INITIAL  5.  Patient is able to have a bowel movement without straining due to improved relaxation of the pelvic floor muscles.  Baseline:  Goal status: INITIAL   PLAN:  PT FREQUENCY: 1x/week  PT DURATION: other: 4 months  PLANNED INTERVENTIONS: Therapeutic exercises, Therapeutic activity, Neuromuscular re-education, Patient/Family education, Joint mobilization, Dry Needling, Electrical stimulation, Cryotherapy, Moist heat, scar mobilization, Ultrasound, Biofeedback, and Manual therapy  PLAN FOR NEXT SESSION: , scar, diaphragmatic breathing, how to massage the perineum with using a ball, how to toilet   Eulis Foster, PT 05/05/23 2:53 PM

## 2023-05-21 ENCOUNTER — Encounter: Payer: Self-pay | Admitting: Physical Therapy

## 2023-05-21 ENCOUNTER — Encounter: Payer: BLUE CROSS/BLUE SHIELD | Attending: Gastroenterology | Admitting: Physical Therapy

## 2023-05-21 DIAGNOSIS — R102 Pelvic and perineal pain: Secondary | ICD-10-CM | POA: Insufficient documentation

## 2023-05-21 DIAGNOSIS — M62838 Other muscle spasm: Secondary | ICD-10-CM | POA: Diagnosis not present

## 2023-05-21 DIAGNOSIS — R1084 Generalized abdominal pain: Secondary | ICD-10-CM | POA: Diagnosis not present

## 2023-05-21 NOTE — Therapy (Signed)
OUTPATIENT PHYSICAL THERAPY FEMALE PELVIC TREATMENT   Patient Name: Hannah Ford MRN: [id number] DOB:October 02, 1995, 27 y.o., female Today's Date: 05/21/2023  END OF SESSION:  PT End of Session - 05/21/23 1503     Visit Number 3    Date for PT Re-Evaluation 06/18/23    Authorization Type [employer]    PT Start Time 1500    PT Stop Time 1545    PT Time Calculation (min) 45 min    Activity Tolerance Patient tolerated treatment well    Behavior During Therapy WFL for tasks assessed/performed             Past Medical History:  Diagnosis Date   Abnormal menstrual periods    Allergy    Anemia    Endometriosis    Frequent headaches    otc med prn   GERD (gastroesophageal reflux disease)    Urticaria    Past Surgical History:  Procedure Laterality Date   COLONOSCOPY WITH PROPOFOL N/A 10/09/2022   Procedure: COLONOSCOPY WITH PROPOFOL;  Surgeon: [name], DO;  Location: [facility] ENDOSCOPY;  Service: Endoscopy;  Laterality: N/A;   ESOPHAGOGASTRODUODENOSCOPY (EGD) WITH PROPOFOL N/A 10/09/2022   Procedure: ESOPHAGOGASTRODUODENOSCOPY (EGD) WITH PROPOFOL;  Surgeon: [name], DO;  Location: [facility] ENDOSCOPY;  Service: Endoscopy;  Laterality: N/A;   INTRAUTERINE DEVICE (IUD) INSERTION N/A 08/15/2022   Procedure: INTRAUTERINE DEVICE (IUD) INSERTION, MIRENA;  Surgeon: Christeen Douglas, MD;  Location: [facility] ORS;  Service: Gynecology;  Laterality: N/A;   LAPAROSCOPY N/A 02/14/2016   Procedure: LAPAROSCOPY DIAGNOSTIC FULGERATION OF PELVIC ENDOMETRIOSIS;  Surgeon: Maxie Better, MD;  Location: [facility] ORS;  Service: Gynecology;  Laterality: N/A;  @ 2hrs.   ROBOTIC ASSISTED LAPAROSCOPIC LYSIS OF ADHESION  08/15/2022   Procedure: XI ROBOTIC ASSISTED LAPAROSCOPIC LYSIS OF ADHESION;  Surgeon: Leafy Ro, MD;  Location: [facility] ORS;  Service: General;;  WITH REPAIR MESENTERY   WISDOM TOOTH EXTRACTION     XI ROBOT ASSISTED DIAGNOSTIC LAPAROSCOPY N/A 08/15/2022   Procedure: XI ROBOT ASSISTED  OPERATIVE LAPAROSCOPY, EXCISION OF ENDOMETRIOSIS, PERITONEAL BIOPSIES LYSIS OF ADHESIONS, PERITONEAL STRIPPING;  Surgeon: Christeen Douglas, MD;  Location: [facility] ORS;  Service: Gynecology;  Laterality: N/A;   Patient Active Problem List   Diagnosis Date Noted   Abdominal pain 07/13/2022   Hematochezia 07/10/2022   Endometriosis 07/10/2022   Well adult exam 05/29/2022   Migraine 11/12/2021   Allergic rhinitis 11/12/2021   Neck pain 11/12/2021   Eczema 05/27/2021   Acute sinusitis 05/27/2021   Contact with other sharp object(s), not elsewhere classified, initial encounter 01/29/2021   Unspecified open wound, right lower leg, initial encounter 01/27/2021   Chronic cough 06/12/2019   Acute left ankle pain 01/18/2018   Upper respiratory infection 10/20/2017   Tonsillith 09/24/2016   Hematuria 05/21/2016   Back pain 05/21/2016   GERD (gastroesophageal reflux disease) 11/19/2015   ANA positive 08/24/2015   Chronic midline low back pain with bilateral sciatica 08/24/2015   Malar rash 08/24/2015   Vitamin D deficiency 08/24/2015    PCP: Plotnikov, Georgina Quint, MD  REFERRING PROVIDER: Louellen Molder, NP   REFERRING DIAG:  R10.30 (ICD-10-CM) - Lower abdominal pain, unspecified  R14.0 (ICD-10-CM) - Abdominal distension (gaseous)    THERAPY DIAG:  Other muscle spasm  Pelvic pain  Generalized abdominal pain  Rationale for Evaluation and Treatment: Rehabilitation  ONSET DATE: 1/24  SUBJECTIVE:  SUBJECTIVE STATEMENT: I have had more spotting vaginally. I am having the cramping now. I have started some abdominal workout without pain. The massage is helping my bowel movements.   Fluid intake: Yes: water, sparkling water, flavored packet, drinks coffee to help to have a bowel movements     PAIN:  Are you having pain? Yes NPRS scale: 5/10 when she eats Pain location:  lower abdominals  Pain type: aching and throbbing, eating is sharp Pain description: intermittent   Aggravating factors: eating Relieving factors: not eating  PRECAUTIONS: None  RED FLAGS: None   WEIGHT BEARING RESTRICTIONS: No  FALLS:  Has patient fallen in last 6 months? No  LIVING ENVIRONMENT: Lives with: lives with their family  OCCUPATION: sitting job with [employer]  PLOF: Independent  PATIENT GOALS: relief of pressure  PERTINENT HISTORY:  Diagnostic laparoscopy 2017, 2024; endometriosis; IBS Sexual abuse: No  BOWEL MOVEMENT: Pain with bowel movement: Yes pressure 6/10 Type of bowel movement:Type (Bristol Stool Scale) Type 5-6 in longer pieces that are fluffy, Frequency 2-3 days without coffee, daily with coffee, and Strain Yes when not having coffee Fully empty rectum: Yes:   Leakage: No Pads: No Fiber supplement: Yes: figuring out which one  URINATION: Pain with urination: Yes 5/10 Fully empty bladder: No, feels like she still needs to [facility]:  start and stop Urgency: Yes: limited time to urinate Frequency: every 1-2 hours, night gets up 1-2 times Leakage: Urge to void Pads: Yes: , 2 pads  INTERCOURSE:Not    PREGNANCY:None     OBJECTIVE:    COGNITION: Overall cognitive status: Within functional limits for tasks assessed     SENSATION: Light touch: Appears intact Proprioception: Appears intact   POSTURE: No Significant postural limitations  PELVIC ALIGNMENT:  LUMBARAROM/PROM: lumbar ROM is full   LOWER EXTREMITY ROM: bilateral hip ROM is full   LOWER EXTREMITY MMT:  MMT Right eval Left eval  Hip extension 5/5 4/5  Hip abduction 4/5 4/5   PALPATION:   General  tenderness located on lateral left gluteal, left levator ani, bilateral hip adduction, throughout the abdomen; surgical side suprapubic decreased mobility                External Perineal  Exam tenderness located in the ischiocavernosus, bulbocavernosus, perineal body, ; Q-tip test: 7, 5, 6                             Internal Pelvic Floor pain when inserted Q-tip into the vaginal canal  Patient confirms identification and approves PT to assess internal pelvic floor and treatment Yes  PELVIC MMT:   MMT eval  Vaginal No assessment due to patient pain level  Internal Anal Sphincter   External Anal Sphincter   Puborectalis   Diastasis Recti   (Blank rows = not tested)        TONE: increased  PROLAPSE: Not able to assess  TODAY'S TREATMENT:        05/20/24 Manual: Soft tissue mobilization: Soft tissue work to bilateral hip adductors and quadriceps to lengthen Manual work to the first layer of the pelvic floor working around the pubic rami and monitoring for pain Myofascial release: Fascial release along the round ligament and mons pubis to reduce tenderness Exercises: Stretches/mobility: Discussed with patient to use the heat and massage the abdomen to help with the cramps.  Laying on the mat with the legs up and pillow under the buttocks with pillow under  the hips and massage the abdomen.  Educated patient on how to perform manual work to the pelvic floor and hip adductors to continue to reduce the trigger points    05/05/23 Manual: Soft tissue mobilization: Circular massage to the abdomen to promote peristalic motion of the intestines and educated patient on how to perform at home Manual work around the umbilicus to elongate the scar tissue Manual work to the diaphragm to lengthen the tissue Scar tissue mobilization: Manual work to the scar above the pubic bone going through the restrictions and showing patient on how to mobilize it Manual work to the scars on the lateral abdomen to go through the restrictions Myofascial release: Tissue rolling of the lower rib cage and around the abdomen Fascial release of the abdomen to go through the restrictions and  elongate the tissue Neuromuscular re-education: Down training: Diaphragmatic breathing to lengthen the diaphragm and pelvic floor                                                                                                                         PATIENT EDUCATION: 05/05/23 Education details: Access Code: [email or web address], education on scar massage Person educated: Patient Education method: Explanation, Demonstration, Tactile cues, Verbal cues, and Handouts Education comprehension: verbalized understanding, returned demonstration, verbal cues required, tactile cues required, and needs further education   HOME EXERCISE PROGRAM: 05/05/23 Access Code: [email or web address] URL: [email or web address] Date: 05/05/2023 Prepared by: Eulis Foster  Exercises - Supine Diaphragmatic Breathing  - 2 x daily - 7 x weekly - 1 sets - 10 reps - Supine Abdominal Wall Massage  - 1 x daily - 7 x weekly - 3 sets - 10 reps  ASSESSMENT:  CLINICAL IMPRESSION: Patient is a 27 y.o. female who was seen today for physical therapy  treatment for lower abdominal pain.   Patient will benefit from skilled therapy to reduce her pain and improve her quality of life.   OBJECTIVE IMPAIRMENTS: decreased activity tolerance, decreased coordination, decreased endurance, decreased strength, increased edema, increased fascial restrictions, increased muscle spasms, impaired tone, and pain.   ACTIVITY LIMITATIONS: continence, toileting, and locomotion level  PARTICIPATION LIMITATIONS: community activity  PERSONAL FACTORS: Time since onset of injury/illness/exacerbation and 1-2 comorbidities: Diagnostic laparoscopy 2017, 2024; endometriosis; IBS  are also affecting patient's functional outcome.   REHAB POTENTIAL: Excellent  CLINICAL DECISION MAKING: Evolving/moderate complexity  EVALUATION COMPLEXITY: Moderate   GOALS: Goals reviewed with patient? Yes  SHORT TERM GOALS: Target date: 03/25/23  Patient independent with  HEP for pain management.  Baseline: Goal status: Met 05/21/23  2.  Patient educated on scar and abdominal massage to reduce tenderness Baseline:  Goal status: Met 05/05/23  3.  Patient is able to have therapist perform manual work to the pelvic floor muscles internally for reduction of tender areas Baseline:  Goal status: INITIAL  4.  Education on how to have a bowel movement with relaxation of the pelvic floor and pushing the stool out.  Baseline:  Goal status: INITIAL  LONG TERM GOALS: Target date: 07/08/23  Patient independent with advanced HEP to strengthen the core and pelvic floor while fully relaxing the muscles too.  Baseline:  Goal status: INITIAL  2.  Patient reports her pressure pain with urinating and bowel movements decreased >/= 2/10 due to the ability to relax the pelvic floor.  Baseline:  Goal status: INITIAL  3.  Patient able to walk to the bathroom when she has the urge to urinate and not leak urine due to improve strength >/= 3/5.  Baseline:  Goal status: INITIAL  4.  Patient is able to have the therapist perform internal manual work to the pelvic floor muscles with pain level ,/= 1-2/10 to facilitate an exam by the MD.  Baseline:  Goal status: INITIAL  5.  Patient is able to have a bowel movement without straining due to improved relaxation of the pelvic floor muscles.  Baseline:  Goal status: INITIAL   PLAN:  PT FREQUENCY: 1x/week  PT DURATION: other: 4 months  PLANNED INTERVENTIONS: Therapeutic exercises, Therapeutic activity, Neuromuscular re-education, Patient/Family education, Joint mobilization, Dry Needling, Electrical stimulation, Cryotherapy, Moist heat, scar mobilization, Ultrasound, Biofeedback, and Manual therapy  PLAN FOR NEXT SESSION: abdominal exercises, manual work to the first layer of the pelvic floor then see if ready for work along the vulvar area, nerve flossing  Eulis Foster, PT 05/21/23 3:48 PM

## 2023-05-28 ENCOUNTER — Encounter: Payer: 59 | Admitting: Physical Therapy

## 2023-06-04 ENCOUNTER — Encounter: Payer: BLUE CROSS/BLUE SHIELD | Admitting: Physical Therapy

## 2023-06-11 ENCOUNTER — Encounter: Payer: 59 | Admitting: Physical Therapy

## 2023-06-18 ENCOUNTER — Encounter: Payer: BC Managed Care – PPO | Attending: Gastroenterology | Admitting: Physical Therapy

## 2023-06-18 ENCOUNTER — Encounter: Payer: Self-pay | Admitting: Physical Therapy

## 2023-06-18 DIAGNOSIS — R1084 Generalized abdominal pain: Secondary | ICD-10-CM | POA: Diagnosis not present

## 2023-06-18 DIAGNOSIS — M62838 Other muscle spasm: Secondary | ICD-10-CM | POA: Diagnosis not present

## 2023-06-18 DIAGNOSIS — R102 Pelvic and perineal pain: Secondary | ICD-10-CM | POA: Diagnosis not present

## 2023-06-18 NOTE — Therapy (Addendum)
 OUTPATIENT PHYSICAL THERAPY FEMALE PELVIC TREATMENT   Patient Name: Hannah Ford MRN: [id number] DOB:01-06-1996, 28 y.o., female Today's Date: 06/18/2023  END OF SESSION:  PT End of Session - 06/18/23 1407     Visit Number 4    Date for PT Re-Evaluation 12/16/23    Authorization Type [employer]    PT Start Time 1400    PT Stop Time 1445    PT Time Calculation (min) 45 min    Activity Tolerance Patient tolerated treatment well    Behavior During Therapy WFL for tasks assessed/performed             Past Medical History:  Diagnosis Date   Abnormal menstrual periods    Allergy    Anemia    Endometriosis    Frequent headaches    otc med prn   GERD (gastroesophageal reflux disease)    Urticaria    Past Surgical History:  Procedure Laterality Date   COLONOSCOPY WITH PROPOFOL  N/A 10/09/2022   Procedure: COLONOSCOPY WITH PROPOFOL ;  Surgeon: Onita Elspeth Sharper, DO;  Location: [facility] ENDOSCOPY;  Service: Endoscopy;  Laterality: N/A;   ESOPHAGOGASTRODUODENOSCOPY (EGD) WITH PROPOFOL  N/A 10/09/2022   Procedure: ESOPHAGOGASTRODUODENOSCOPY (EGD) WITH PROPOFOL ;  Surgeon: Onita Elspeth Sharper, DO;  Location: [facility]COPY;  Service: Endoscopy;  Laterality: N/A;   INTRAUTERINE DEVICE (IUD) INSERTION N/A 08/15/2022   Procedure: INTRAUTERINE DEVICE (IUD) INSERTION, MIRENA ;  Surgeon: Verdon Keen, MD;  Location: [facility] ORS;  Service: Gynecology;  Laterality: N/A;   LAPAROSCOPY N/A 02/14/2016   Procedure: LAPAROSCOPY DIAGNOSTIC FULGERATION OF PELVIC ENDOMETRIOSIS;  Surgeon: Dickie Carder, MD;  Location: [facility] ORS;  Service: Gynecology;  Laterality: N/A;  @ 2hrs.   ROBOTIC ASSISTED LAPAROSCOPIC LYSIS OF ADHESION  08/15/2022   Procedure: XI ROBOTIC ASSISTED LAPAROSCOPIC LYSIS OF ADHESION;  Surgeon: Jordis Laneta FALCON, MD;  Location: [facility] ORS;  Service: General;;  WITH REPAIR MESENTERY   WISDOM TOOTH EXTRACTION     XI ROBOT ASSISTED DIAGNOSTIC LAPAROSCOPY N/A 08/15/2022   Procedure: XI ROBOT ASSISTED  OPERATIVE LAPAROSCOPY, EXCISION OF ENDOMETRIOSIS, PERITONEAL BIOPSIES LYSIS OF ADHESIONS, PERITONEAL STRIPPING;  Surgeon: Verdon Keen, MD;  Location: [facility] ORS;  Service: Gynecology;  Laterality: N/A;   Patient Active Problem List   Diagnosis Date Noted   Abdominal pain 07/13/2022   Hematochezia 07/10/2022   Endometriosis 07/10/2022   Well adult exam 05/29/2022   Migraine 11/12/2021   Allergic rhinitis 11/12/2021   Neck pain 11/12/2021   Eczema 05/27/2021   Acute sinusitis 05/27/2021   Contact with other sharp object(s), not elsewhere classified, initial encounter 01/29/2021   Unspecified open wound, right lower leg, initial encounter 01/27/2021   Chronic cough 06/12/2019   Acute left ankle pain 01/18/2018   Upper respiratory infection 10/20/2017   Tonsillith 09/24/2016   Hematuria 05/21/2016   Back pain 05/21/2016   GERD (gastroesophageal reflux disease) 11/19/2015   ANA positive 08/24/2015   Chronic midline low back pain with bilateral sciatica 08/24/2015   Malar rash 08/24/2015   Vitamin D  deficiency 08/24/2015    PCP: Plotnikov, Karlynn GAILS, MD  REFERRING PROVIDER: Jane Delmar Pike, NP   REFERRING DIAG:  R10.30 (ICD-10-CM) - Lower abdominal pain, unspecified  R14.0 (ICD-10-CM) - Abdominal distension (gaseous)    THERAPY DIAG:  Other muscle spasm - Plan: PT plan of care cert/re-cert  Pelvic pain - Plan: PT plan of care cert/re-cert  Generalized abdominal pain - Plan: PT plan of care cert/re-cert  Rationale for Evaluation and Treatment: Rehabilitation  ONSET DATE: 1/24  SUBJECTIVE:  SUBJECTIVE STATEMENT: I am still spotting so will see the GYN on next Monday.  Fluid intake: Yes: water, sparkling water, flavored packet, drinks coffee to help to have a bowel movements     PAIN:  Are you having pain? Yes NPRS scale: 1/10 when she eats, sharp pain in umbilicus at 8/10 with bending forward and pressure on the lower abdomen Pain location:  lower abdominals  Pain type: aching and throbbing, eating is sharp Pain description: intermittent   Aggravating factors: eating Relieving factors: not eating  PRECAUTIONS: None  RED FLAGS: None   WEIGHT BEARING RESTRICTIONS: No  FALLS:  Has patient fallen in last 6 months? No  LIVING ENVIRONMENT: Lives with: lives with their family  OCCUPATION: sitting job with [employer]  PLOF: Independent  PATIENT GOALS: relief of pressure  PERTINENT HISTORY:  Diagnostic laparoscopy 2017, 2024; endometriosis; IBS Sexual abuse: No  BOWEL MOVEMENT: Pain with bowel movement: Yes pressure 6/10 Type of bowel movement:Type (Bristol Stool Scale) Type 5-6 in longer pieces that are fluffy, Frequency 2-3 days without coffee, daily with coffee, and Strain Yes when not having coffee Fully empty rectum: Yes:   Leakage: No Pads: No Fiber supplement: Yes: figuring out which one  URINATION: Pain with urination: pressure Fully empty bladder: No, feels like she still needs to [facility]:  start and stop Urgency: Yes: limited time to urinate Frequency: every 2 hours, night gets up 1-2 times Leakage: Urge to void Pads: Yes: , 2 pads  INTERCOURSE:Not    PREGNANCY:None     OBJECTIVE:    COGNITION: Overall cognitive status: Within functional limits for tasks assessed     SENSATION: Light touch: Appears intact Proprioception: Appears intact   POSTURE: No Significant postural limitations  PELVIC ALIGNMENT:  LUMBARAROM/PROM: lumbar ROM is full   LOWER EXTREMITY ROM: bilateral hip ROM is full   LOWER EXTREMITY MMT:  MMT Right eval Left eval  Hip extension 5/5 4/5  Hip abduction 4/5 4/5   PALPATION:   General  tenderness located on lateral left gluteal, left levator ani, bilateral hip adduction, throughout the  abdomen; surgical side suprapubic decreased mobility                External Perineal Exam tenderness located in the ischiocavernosus, bulbocavernosus, perineal body, ; Q-tip test: 7, 5, 6                             Internal Pelvic Floor pain when inserted Q-tip into the vaginal canal  Patient confirms identification and approves PT to assess internal pelvic floor and treatment Yes 06/18/23 Patient did not want assessment of pelvic floor today due to the pain around the vulva area.   PELVIC MMT:   MMT eval  Vaginal No assessment due to patient pain level  Internal Anal Sphincter   External Anal Sphincter   Puborectalis   Diastasis Recti   (Blank rows = not tested)        TONE: increased  PROLAPSE: Not able to assess  TODAY'S TREATMENT:    06/18/23 Manual: Myofascial release: Fascial release to the lower abdomen to reduce the restrictions Exercises: Stretches/mobility: Hip adductor stretch with legs apart in sitting holding 30 sec Z stretch 5 times both ways, to stretch the abdomen and hips Prone on hands with knee flexed holding 30 sec bil.  Trunk rotation pulling knee across the body holding 30 sec bil.  Cat cow 20 x  Childs pose with diaphragmatic  breathing 30 sec.  Foam roll of hip flexors  Sit on foam roll to massage the pelvic floor muscles.         05/20/24 Manual: Soft tissue mobilization: Soft tissue work to bilateral hip adductors and quadriceps to lengthen Manual work to the first layer of the pelvic floor working around the pubic rami and monitoring for pain Myofascial release: Fascial release along the round ligament and mons pubis to reduce tenderness Exercises: Stretches/mobility: Discussed with patient to use the heat and massage the abdomen to help with the cramps.  Laying on the mat with the legs up and pillow under the buttocks with pillow under the hips and massage the abdomen.  Educated patient on how to perform manual work to the pelvic floor  and hip adductors to continue to reduce the trigger points    05/05/23 Manual: Soft tissue mobilization: Circular massage to the abdomen to promote peristalic motion of the intestines and educated patient on how to perform at home Manual work around the umbilicus to elongate the scar tissue Manual work to the diaphragm to lengthen the tissue Scar tissue mobilization: Manual work to the scar above the pubic bone going through the restrictions and showing patient on how to mobilize it Manual work to the scars on the lateral abdomen to go through the restrictions Myofascial release: Tissue rolling of the lower rib cage and around the abdomen Fascial release of the abdomen to go through the restrictions and elongate the tissue Neuromuscular re-education: Down training: Diaphragmatic breathing to lengthen the diaphragm and pelvic floor                                                                                                                         PATIENT EDUCATION: 05/05/23 Education details: Access Code: [email or web address], education on scar massage Person educated: Patient Education method: Explanation, Demonstration, Tactile cues, Verbal cues, and Handouts Education comprehension: verbalized understanding, returned demonstration, verbal cues required, tactile cues required, and needs further education   HOME EXERCISE PROGRAM: 05/05/23 Access Code: [email or web address] URL: [email or web address] Date: 05/05/2023 Prepared by: Channing Pereyra  Exercises - Supine Diaphragmatic Breathing  - 2 x daily - 7 x weekly - 1 sets - 10 reps - Supine Abdominal Wall Massage  - 1 x daily - 7 x weekly - 3 sets - 10 reps  ASSESSMENT:  CLINICAL IMPRESSION: Patient is a 28 y.o. female who was seen today for physical therapy  treatment for lower abdominal pain.  Lower abdominal pain is now 1/10 instead of 5/10 constant and  get intermittent sharp pain in the umbilicus at level 7/10.  She is not having  the sharp pains in the lower abdomen as often. Patient has the pressure in the rectum with a bowel movement and vaginal with urination. Urinates every 2 hours instead of 1 hour. She now has a full urine stream. Patient still leaks with urge to void. She wears pads with less leakage. Therapist is not able to assess the patient  pelvic floor due to the increased in pain and spotting. She will be seeing the doctor next week.  Patient will benefit from skilled therapy to reduce her pain and improve her quality of life.   OBJECTIVE IMPAIRMENTS: decreased activity tolerance, decreased coordination, decreased endurance, decreased strength, increased edema, increased fascial restrictions, increased muscle spasms, impaired tone, and pain.   ACTIVITY LIMITATIONS: continence, toileting, and locomotion level  PARTICIPATION LIMITATIONS: community activity  PERSONAL FACTORS: Time since onset of injury/illness/exacerbation and 1-2 comorbidities: Diagnostic laparoscopy 2017, 2024; endometriosis; IBS  are also affecting patient's functional outcome.   REHAB POTENTIAL: Excellent  CLINICAL DECISION MAKING: Evolving/moderate complexity  EVALUATION COMPLEXITY: Moderate   GOALS: Goals reviewed with patient? Yes  SHORT TERM GOALS: Target date: 03/25/23  Patient independent with HEP for pain management.  Baseline: Goal status: Met 05/21/23  2.  Patient educated on scar and abdominal massage to reduce tenderness Baseline:  Goal status: Met 05/05/23  3.  Patient is able to have therapist perform manual work to the pelvic floor muscles internally for reduction of tender areas Baseline:  Goal status: ongoing 06/18/23  4.  Education on how to have a bowel movement with relaxation of the pelvic floor and pushing the stool out.  Baseline:  Goal status: ongoing 06/18/23   LONG TERM GOALS: Target date: 01/05/24  Patient independent with advanced HEP to strengthen the core and pelvic floor while fully relaxing the  muscles too.  Baseline:  Goal status: ongoing 06/18/23  2.  Patient reports her pressure pain with urinating and bowel movements decreased >/= 2/10 due to the ability to relax the pelvic floor.  Baseline:  Goal status: ongoing 06/18/23  3.  Patient able to walk to the bathroom when she has the urge to urinate and not leak urine due to improve strength >/= 3/5.  Baseline:  Goal status: ongoing 06/18/23  4.  Patient is able to have the therapist perform internal manual work to the pelvic floor muscles with pain level ,/= 1-2/10 to facilitate an exam by the MD.  Baseline:  Goal status: ongoing 06/18/23  5.  Patient is able to have a bowel movement without straining due to improved relaxation of the pelvic floor muscles.  Baseline:  Goal status: Ongoing 06/18/23   PLAN:  PT FREQUENCY: 1x/week  PT DURATION: other: 6 months  PLANNED INTERVENTIONS: Therapeutic exercises, Therapeutic activity, Neuromuscular re-education, Patient/Family education, Joint mobilization, Dry Needling, Electrical stimulation, Cryotherapy, Moist heat, scar mobilization, Ultrasound, Biofeedback, and Manual therapy  PLAN FOR NEXT SESSION: abdominal exercises, manual work to the first layer of the pelvic floor then see if ready for work along the vulvar area, nerve flossing, how to have a bowel movement, see what MD says  Channing Pereyra, PT 06/18/23 2:56 PM   PHYSICAL THERAPY DISCHARGE SUMMARY  Visits from Start of Care: 4  Current functional level related to goals / functional outcomes: See above. Patient called to cancel her appointment due to having a cyst and MD wants her to stop therapy.    Remaining deficits: See above   Education / Equipment: HEP   Patient agrees to discharge. Patient goals were not met. Patient is being discharged due to the physician's request. Thank you for the referral.   Channing Pereyra, PT 08/18/23 1:05 PM

## 2023-06-23 DIAGNOSIS — R102 Pelvic and perineal pain: Secondary | ICD-10-CM | POA: Diagnosis not present

## 2023-06-23 DIAGNOSIS — G8929 Other chronic pain: Secondary | ICD-10-CM | POA: Diagnosis not present

## 2023-06-23 DIAGNOSIS — N939 Abnormal uterine and vaginal bleeding, unspecified: Secondary | ICD-10-CM | POA: Diagnosis not present

## 2023-06-23 DIAGNOSIS — N809 Endometriosis, unspecified: Secondary | ICD-10-CM | POA: Diagnosis not present

## 2023-06-26 ENCOUNTER — Ambulatory Visit: Payer: BC Managed Care – PPO | Admitting: Physical Therapy

## 2023-07-25 ENCOUNTER — Telehealth: Payer: BC Managed Care – PPO | Admitting: Nurse Practitioner

## 2023-07-25 DIAGNOSIS — R6889 Other general symptoms and signs: Secondary | ICD-10-CM

## 2023-07-25 NOTE — Progress Notes (Signed)
 E visit for Flu like symptoms   We are sorry that you are not feeling well.  Here is how we plan to help! Based on what you have shared with me it looks like you may have possible exposure to a virus that causes influenza.  Influenza or "the flu" is   an infection caused by a respiratory virus. The flu virus is highly contagious and persons who did not receive their yearly flu vaccination may "catch" the flu from close contact.  We have anti-viral medications to treat the viruses that cause this infection. They are not a "cure" and only shorten the course of the infection. These prescriptions are most effective when they are given within the first 2 days of "flu" symptoms. Antiviral medication are indicated if you have a high risk of complications from the flu. You should  also consider an antiviral medication if you are in close contact with someone who is at risk. These medications can help patients avoid complications from the flu  but have side effects that you should know. Possible side effects from Tamiflu or oseltamivir include nausea, vomiting, diarrhea, dizziness, headaches, eye redness, sleep problems or other respiratory symptoms. You should not take Tamiflu if you have an allergy to oseltamivir or any to the ingredients in Tamiflu.  Based upon your symptoms and potential risk factors I recommend that you follow the flu symptoms recommendation that I have listed below.Unfortunately tamiflu is not recommended after 2 days of symptoms onset so we would not be able to prescribe.  You can alternate with tylenol  and motrin  for pain and fever.   ANYONE WHO HAS FLU SYMPTOMS SHOULD: Stay home. The flu is highly contagious and going out or to work exposes others! Be sure to drink plenty of fluids. Water is fine as well as fruit juices, sodas and electrolyte beverages. You may want to stay away from caffeine or alcohol. If you are nauseated, try taking small sips of liquids. How do you know if you  are getting enough fluid? Your urine should be a pale yellow or almost colorless. Get rest. Taking a steamy shower or using a humidifier may help nasal congestion and ease sore throat pain. Using a saline nasal spray works much the same way. Cough drops, hard candies and sore throat lozenges may ease your cough. Line up a caregiver. Have someone check on you regularly.   GET HELP RIGHT AWAY IF: You cannot keep down liquids or your medications. You become short of breath Your fell like you are going to pass out or loose consciousness. Your symptoms persist after you have completed your treatment plan MAKE SURE YOU  Understand these instructions. Will watch your condition. Will get help right away if you are not doing well or get worse.  Your e-visit answers were reviewed by a board certified advanced clinical practitioner to complete your personal care plan.  Depending on the condition, your plan could have included both over the counter or prescription medications.  If there is a problem please reply  once you have received a response from your provider.  Your safety is important to us .  If you have drug allergies check your prescription carefully.    You can use MyChart to ask questions about today's visit, request a non-urgent call back, or ask for a work or school excuse for 24 hours related to this e-Visit. If it has been greater than 24 hours you will need to follow up with your provider, or enter a  new e-Visit to address those concerns.  You will get an e-mail in the next two days asking about your experience.  I hope that your e-visit has been valuable and will speed your recovery. Thank you for using e-visits.

## 2023-07-25 NOTE — Progress Notes (Signed)
 I have spent 5 minutes in review of e-visit questionnaire, review and updating patient chart, medical decision making and response to patient.   Claiborne Rigg, NP

## 2023-07-28 DIAGNOSIS — N809 Endometriosis, unspecified: Secondary | ICD-10-CM | POA: Diagnosis not present

## 2023-07-28 DIAGNOSIS — T8332XA Displacement of intrauterine contraceptive device, initial encounter: Secondary | ICD-10-CM | POA: Diagnosis not present

## 2023-07-28 DIAGNOSIS — R102 Pelvic and perineal pain: Secondary | ICD-10-CM | POA: Diagnosis not present

## 2023-07-28 DIAGNOSIS — N939 Abnormal uterine and vaginal bleeding, unspecified: Secondary | ICD-10-CM | POA: Diagnosis not present

## 2023-07-28 DIAGNOSIS — G8929 Other chronic pain: Secondary | ICD-10-CM | POA: Diagnosis not present

## 2023-08-13 ENCOUNTER — Telehealth: Payer: Self-pay | Admitting: Physical Therapy

## 2023-08-13 ENCOUNTER — Encounter: Payer: BC Managed Care – PPO | Attending: Gastroenterology | Admitting: Physical Therapy

## 2023-08-13 DIAGNOSIS — R102 Pelvic and perineal pain: Secondary | ICD-10-CM | POA: Insufficient documentation

## 2023-08-13 DIAGNOSIS — M62838 Other muscle spasm: Secondary | ICD-10-CM | POA: Insufficient documentation

## 2023-08-13 DIAGNOSIS — R1084 Generalized abdominal pain: Secondary | ICD-10-CM | POA: Insufficient documentation

## 2023-08-13 NOTE — Telephone Encounter (Signed)
 Called patient about her missed appointment today at 15:00. She said she got caught in a meeting and forgot to cancel. Therapist made her aware of the no-show policy.  Eulis Foster, PT @2 /27/25@ 3:19 PM

## 2023-08-18 ENCOUNTER — Encounter: Payer: Self-pay | Attending: Gastroenterology | Admitting: Physical Therapy

## 2023-12-17 DIAGNOSIS — G8929 Other chronic pain: Secondary | ICD-10-CM | POA: Diagnosis not present

## 2023-12-17 DIAGNOSIS — R0602 Shortness of breath: Secondary | ICD-10-CM | POA: Diagnosis not present

## 2023-12-17 DIAGNOSIS — R233 Spontaneous ecchymoses: Secondary | ICD-10-CM | POA: Diagnosis not present

## 2023-12-17 DIAGNOSIS — R102 Pelvic and perineal pain: Secondary | ICD-10-CM | POA: Diagnosis not present

## 2024-01-19 DIAGNOSIS — G8929 Other chronic pain: Secondary | ICD-10-CM | POA: Diagnosis not present

## 2024-01-19 DIAGNOSIS — Z124 Encounter for screening for malignant neoplasm of cervix: Secondary | ICD-10-CM | POA: Diagnosis not present

## 2024-01-19 DIAGNOSIS — R102 Pelvic and perineal pain: Secondary | ICD-10-CM | POA: Diagnosis not present

## 2024-01-21 NOTE — H&P (Signed)
 Patient ID: Hannah Ford is a 28 y.o. female presenting with Pre Op Consulting (Sign consents) on 01/19/2024  HPI: Known endometriosis: trial of Orlissa 150mg  dizziness, nausea, lightheadedness. Does have SOB with her cycle, so will assess right upper quadrant and diaphragm   Pertinent endo Hx:  - 06/2023: new pain since IUD placed 10 months ago, tvus showing IUD in LUS - 02/2023: She started PFPT for endometriosis and pelvic pain, with some improvement - 08/2022: RA Dx Lap with LOA, excision of endo, peritoneal stripping and insertion of Mirena  IUD on 08/15/22  - 2020: Hx of OCPs for endo control and dysmenorrhea, she had mood changes and some fatigue and stopped in about 2020.  - 2017: Endometriosis dx by laparoscopy in 2017 with [facility]   Pertinent hx: - IBS? - Improved pain with dx lap in the past - Migraines  Past Medical History: has a past medical history of Allergy, Anemia, Endometriosis of uterus (August 2017), GERD (gastroesophageal reflux disease), IBS (irritable bowel syndrome), and Migraine headache.  Past Surgical History: has a past surgical history that includes Pelvic laparoscopy (2017); laparoscopy diagnostic (08/15/2022); lysis of adhesions (08/15/2022); examination under anesthesia (08/15/2022); Colon @ [facility] (10/09/2022); EGD @ [facility] (10/09/2022); and Endometrial ablation (02/14/2016). Family History: family history includes Arthritis in her paternal grandmother and paternal uncle; Colon cancer in her maternal grandmother; Coronary Artery Disease (Blocked arteries around heart) in her maternal grandmother; Diabetes in her maternal grandmother; Endometriosis in her mother; High blood pressure (Hypertension) in her maternal grandmother and mother; Myocardial Infarction (Heart attack) in her maternal grandmother. Social History: reports that she has never smoked. She has never used smokeless tobacco. She reports that she does not drink alcohol and does not use drugs. OB/GYN  History:  OB History   Gravida  0  Para  0  Term  0  Preterm  0  AB  0  Living  0    SAB  0  IAB  0  Ectopic  0  Molar  0  Multiple  0  Live Births  0      Allergies: is allergic to zithromax  [azithromycin ], cefdinir , latex, metronidazole, penicillin, rizatriptan , and chlorhexidine . Medications:  Current Outpatient Medications:  diazePAM (VALIUM) 5 MG tablet, Insert one tablet vaginally 4 hours prior to procedure for vaginal pain, and one tablet vaginally after if needed, Disp: 2 tablet, Rfl: 0 levonorgestreL  (MIRENA  52 MG) IUD, Insert 1 each into the uterus once Follow package directions., Disp: , Rfl:  elagolix 150 mg Tab, Take 1 tablet (150 mg total) by mouth once daily (Patient not taking: Reported on 01/19/2024), Disp: 28 tablet, Rfl: 11 pantoprazole  (PROTONIX ) 40 MG DR tablet, Take 40 mg by mouth once daily (Patient not taking: Reported on 01/19/2024), Disp: , Rfl:  sucralfate  (CARAFATE ) 100 mg/mL suspension, Take by mouth (Patient not taking: Reported on 12/04/2022), Disp: , Rfl:  SUMAtriptan  (IMITREX ) 100 MG tablet, , Disp: , Rfl:   Review of Systems: No SOB, no palpitations or chest pain, no new lower extremity edema, no nausea or vomiting or bowel or bladder complaints. See HPI for gyn specific ROS.  Exam:    BP 131/83  Pulse 63  Ht 167.6 cm (5' 6)  Wt 98 kg (216 lb)  BMI 34.86 kg/m   General: Patient is well-groomed, well-nourished, appears stated age in no acute distress  HEENT: head is atraumatic and normocephalic, trachea is midline, neck is supple with no palpable nodules  CV: Regular rhythm and normal heart rate, no  murmur  Pulm: Clear to auscultation throughout lung fields with no wheezing, crackles, or rhonchi. No increased work of breathing  Abdomen: soft , no mass, non-tender, no rebound tenderness, no hepatomegaly  Pelvic: tanner stage 5 ,  External genitalia: vulva /labia no lesions Urethra: no prolapse Vagina: normal physiologic  d/c, laxity in vaginal walls Cervix: no lesions, no cervical motion tenderness, good descent Uterus: normal size shape and contour, non-tender Adnexa: no mass, non-tender  Rectovaginal: External wnl  Pap collected Chaperone present for pelvic exam.  Impression:   The primary encounter diagnosis was Cervical cancer screening. A diagnosis of Chronic pelvic pain in female was also pertinent to this visit.  Plan:   Patient returns for a preoperative discussion regarding her plans to proceed with definitive surgical treatment of her endometriosis by robotic assisted total laparoscopic hysterectomy with bilateral salpingectomy, Lysis of adhesions, excision of endo, any other associated procedures.  **Patient declines blood products**  The patient and I discussed the technical aspects of the procedure including the potential for risks and complications. These include but are not limited to the risk of infection requiring post-operative antibiotics or further procedures. We talked about the risk of injury to adjacent organs including bladder, bowel, ureter, blood vessels or nerves. We talked about the need to convert to an open incision. We talked about the possible need for blood transfusion. We talked about postop complications such as thromboembolic or cardiopulmonary complications. All of her questions were answered. Her preoperative exam was completed and the appropriate consents were signed. She is scheduled to undergo this procedure in the near future.  Orders Placed This Encounter  Procedures  IGP, rfx Aptima HPV ASCU - [facility]  Release to patient: Immediate

## 2024-02-05 ENCOUNTER — Encounter
Admission: RE | Admit: 2024-02-05 | Discharge: 2024-02-05 | Disposition: A | Discharge status: Home / Self Care | Source: Ambulatory Visit | Attending: Obstetrics and Gynecology | Admitting: Obstetrics and Gynecology

## 2024-02-05 ENCOUNTER — Other Ambulatory Visit: Payer: Self-pay

## 2024-02-05 DIAGNOSIS — Z01812 Encounter for preprocedural laboratory examination: Secondary | ICD-10-CM

## 2024-02-05 HISTORY — DX: Dyspnea, unspecified: R06.00

## 2024-02-05 HISTORY — DX: Family history of other specified conditions: Z84.89

## 2024-02-05 HISTORY — DX: Pneumonia, unspecified organism: J18.9

## 2024-02-05 HISTORY — DX: Palpitations: R00.2

## 2024-02-05 HISTORY — DX: Prediabetes: R73.03

## 2024-02-05 NOTE — Patient Instructions (Addendum)
 Your procedure is scheduled on: 02/12/24 - Friday Report to the Registration Desk on the 1st floor of the [facility]. To find out your arrival time, please call [phone] between 1PM - 3PM on: 02/11/24 - Thursday If your arrival time is 6:00 am, do not arrive before that time as the [facility] entrance doors do not open until 6:00 am.  REMEMBER: Instructions that are not followed completely may result in serious medical risk, up to and including death; or upon the discretion of your surgeon and anesthesiologist your surgery may need to be rescheduled.  Do not eat food after midnight the night before surgery.  No gum chewing or hard candies.  You may however, drink CLEAR liquids up to 2 hours before you are scheduled to arrive for your surgery. Do not drink anything within 2 hours of your scheduled arrival time.  Clear liquids include: - water  - apple juice without pulp - gatorade (not RED colors) - black coffee or tea (Do NOT add milk or creamers to the coffee or tea) Do NOT drink anything that is not on this list.  In addition, your doctor has ordered for you to drink the provided:  Ensure Pre-Surgery Clear Carbohydrate Drink  Drinking this carbohydrate drink up to two hours before surgery helps to reduce insulin resistance and improve patient outcomes. Please complete drinking 3 hours before scheduled arrival time.  One week prior to surgery: Stop Anti-inflammatories (NSAIDS) such as Advil , Aleve, Ibuprofen , Motrin , Naproxen, Naprosyn and Aspirin based products such as Excedrin, Goody's Powder, BC Powder. You may take Tylenol  if needed for pain up until the day of surgery.  Stop ANY OVER THE COUNTER supplements until after surgery : Multiple Vitamin    ON THE DAY OF SURGERY ONLY TAKE THESE MEDICATIONS WITH SIPS OF WATER: none    No Alcohol for 24 hours before or after surgery.  No Smoking including e-cigarettes for 24 hours before surgery.  No chewable tobacco  products for at least 6 hours before surgery.  No nicotine patches on the day of surgery.  Do not use any recreational drugs for at least a week (preferably 2 weeks) before your surgery.  Please be advised that the combination of cocaine and anesthesia may have negative outcomes, up to and including death. If you test positive for cocaine, your surgery will be cancelled.  On the morning of surgery brush your teeth with toothpaste and water, you may rinse your mouth with mouthwash if you wish. Do not swallow any toothpaste or mouthwash.   Do not wear jewelry, make-up, hairpins, clips or nail polish.  For welded (permanent) jewelry: bracelets, anklets, waist bands, etc.  Please have this removed prior to surgery.  If it is not removed, there is a chance that hospital personnel will need to cut it off on the day of surgery.  Do not wear lotions, powders, or perfumes.   Do not shave body hair from the neck down 48 hours before surgery.  Contact lenses, hearing aids and dentures may not be worn into surgery.  Do not bring valuables to the hospital. [facility] is not responsible for any missing/lost belongings or valuables.   Notify your doctor if there is any change in your medical condition (cold, fever, infection).  Wear comfortable clothing (specific to your surgery type) to the hospital.  After surgery, you can help prevent lung complications by doing breathing exercises.  Take deep breaths and cough every 1-2 hours. Your doctor may order a  device called an Incentive Spirometer to help you take deep breaths.  When coughing or sneezing, hold a pillow firmly against your incision with both hands. This is called "splinting." Doing this helps protect your incision. It also decreases belly discomfort.  If you are being admitted to the hospital overnight, leave your suitcase in the car. After surgery it may be brought to your room.  In case of increased patient census, it may be  necessary for you, the patient, to continue your postoperative care in the Same Day Surgery department.  If you are being discharged the day of surgery, you will not be allowed to drive home. You will need a responsible individual to drive you home and stay with you for 24 hours after surgery.   If you are taking public transportation, you will need to have a responsible individual with you.  Please call the Pre-admissions Testing Dept. at [phone] if you have any questions about these instructions.  Surgery Visitation Policy:  Patients having surgery or a procedure may have two visitors.  Children under the age of 25 must have an adult with them who is not the patient.  Inpatient Visitation:    Visiting hours are 7 a.m. to 8 p.m. Up to four visitors are allowed at one time in a patient room. The visitors may rotate out with other people during the day.  One visitor age 59 or older may stay with the patient overnight and must be in the room by 8 p.m.   Merchandiser, retail to address health-related social needs:  [email or web address]   How to Use an Incentive Spirometer  An incentive spirometer is a tool that measures how well you are filling your lungs with each breath. Learning to take long, deep breaths using this tool can help you keep your lungs clear and active. This may help to reverse or lessen your chance of developing breathing (pulmonary) problems, especially infection. You may be asked to use a spirometer: After a surgery. If you have a lung problem or a history of smoking. After a long period of time when you have been unable to move or be active. If the spirometer includes an indicator to show the highest number that you have reached, your health care provider or respiratory therapist will help you set a goal. Keep a log of your progress as told by your health care provider. What are the risks? Breathing too quickly may cause dizziness or cause  you to pass out. Take your time so you do not get dizzy or light-headed. If you are in pain, you may need to take pain medicine before doing incentive spirometry. It is harder to take a deep breath if you are having pain. How to use your incentive spirometer  Sit up on the edge of your bed or on a chair. Hold the incentive spirometer so that it is in an upright position. Before you use the spirometer, breathe out normally. Place the mouthpiece in your mouth. Make sure your lips are closed tightly around it. Breathe in slowly and as deeply as you can through your mouth, causing the piston or the ball to rise toward the top of the chamber. Hold your breath for 3-5 seconds, or for as long as possible. If the spirometer includes a coach indicator, use this to guide you in breathing. Slow down your breathing if the indicator goes above the marked areas. Remove the mouthpiece from your mouth and breathe out normally. The piston or ball  will return to the bottom of the chamber. Rest for a few seconds, then repeat the steps 10 or more times. Take your time and take a few normal breaths between deep breaths so that you do not get dizzy or light-headed. Do this every 1-2 hours when you are awake. If the spirometer includes a goal marker to show the highest number you have reached (best effort), use this as a goal to work toward during each repetition. After each set of 10 deep breaths, cough a few times. This will help to make sure that your lungs are clear. If you have an incision on your chest or abdomen from surgery, place a pillow or a rolled-up towel firmly against the incision when you cough. This can help to reduce pain while taking deep breaths and coughing. General tips When you are able to get out of bed: Walk around often. Continue to take deep breaths and cough in order to clear your lungs. Keep using the incentive spirometer until your health care provider says it is okay to stop using it.  If you have been in the hospital, you may be told to keep using the spirometer at home. Contact a health care provider if: You are having difficulty using the spirometer. You have trouble using the spirometer as often as instructed. Your pain medicine is not giving enough relief for you to use the spirometer as told. You have a fever. Get help right away if: You develop shortness of breath. You develop a cough with bloody mucus from the lungs. You have fluid or blood coming from an incision site after you cough. Summary An incentive spirometer is a tool that can help you learn to take long, deep breaths to keep your lungs clear and active. You may be asked to use a spirometer after a surgery, if you have a lung problem or a history of smoking, or if you have been inactive for a long period of time. Use your incentive spirometer as instructed every 1-2 hours while you are awake. If you have an incision on your chest or abdomen, place a pillow or a rolled-up towel firmly against your incision when you cough. This will help to reduce pain. Get help right away if you have shortness of breath, you cough up bloody mucus, or blood comes from your incision when you cough. This information is not intended to replace advice given to you by your health care provider. Make sure you discuss any questions you have with your health care provider. Document Revised: 08/22/2019 Document Reviewed: 08/22/2019 Elsevier Patient Education  2023 [employer].

## 2024-02-09 ENCOUNTER — Encounter
Admission: RE | Admit: 2024-02-09 | Discharge: 2024-02-09 | Disposition: A | Discharge status: Home / Self Care | Source: Ambulatory Visit | Attending: Obstetrics and Gynecology | Admitting: Obstetrics and Gynecology

## 2024-02-09 DIAGNOSIS — N809 Endometriosis, unspecified: Secondary | ICD-10-CM | POA: Diagnosis not present

## 2024-02-09 DIAGNOSIS — N838 Other noninflammatory disorders of ovary, fallopian tube and broad ligament: Secondary | ICD-10-CM | POA: Diagnosis not present

## 2024-02-09 DIAGNOSIS — G8929 Other chronic pain: Secondary | ICD-10-CM | POA: Diagnosis not present

## 2024-02-09 DIAGNOSIS — K219 Gastro-esophageal reflux disease without esophagitis: Secondary | ICD-10-CM | POA: Diagnosis not present

## 2024-02-09 DIAGNOSIS — R102 Pelvic and perineal pain: Secondary | ICD-10-CM | POA: Diagnosis not present

## 2024-02-09 DIAGNOSIS — Z01812 Encounter for preprocedural laboratory examination: Secondary | ICD-10-CM | POA: Insufficient documentation

## 2024-02-09 LAB — BASIC METABOLIC PANEL WITH GFR
Anion gap: 6 (ref 5–15)
BUN: 12 mg/dL (ref 6–20)
CO2: 28 mmol/L (ref 22–32)
Calcium: 9.4 mg/dL (ref 8.9–10.3)
Chloride: 107 mmol/L (ref 98–111)
Creatinine, Ser: 0.81 mg/dL (ref 0.44–1.00)
GFR, Estimated: >60 mL/min (ref >60)
Glucose, Bld: 87 mg/dL (ref 70–99)
Potassium: 3.9 mmol/L (ref 3.5–5.1)
Sodium: 141 mmol/L (ref 135–145)

## 2024-02-09 LAB — CBC
HCT: 38.8 % (ref 36.0–46.0)
Hemoglobin: 11.9 g/dL — ABNORMAL LOW (ref 12.0–15.0)
MCH: 26.4 pg (ref 26.0–34.0)
MCHC: 30.7 g/dL (ref 30.0–36.0)
MCV: 86 fL (ref 80.0–100.0)
Platelets: 202 K/uL (ref 150–400)
RBC: 4.51 MIL/uL (ref 3.87–5.11)
RDW: 13.1 % (ref 11.5–15.5)
WBC: 5.3 K/uL (ref 4.0–10.5)
nRBC: 0 % (ref 0.0–0.2)

## 2024-02-12 ENCOUNTER — Other Ambulatory Visit: Payer: Self-pay

## 2024-02-12 ENCOUNTER — Ambulatory Visit: Admitting: Certified Registered"

## 2024-02-12 ENCOUNTER — Encounter
Admission: RE | Disposition: A | Discharge status: Home / Self Care | Payer: Self-pay | Source: Home / Self Care | Attending: Obstetrics and Gynecology

## 2024-02-12 ENCOUNTER — Ambulatory Visit
Admission: RE | Admit: 2024-02-12 | Discharge: 2024-02-12 | Disposition: A | Discharge status: Home / Self Care | Attending: Obstetrics and Gynecology | Admitting: Obstetrics and Gynecology

## 2024-02-12 ENCOUNTER — Encounter: Payer: Self-pay | Admitting: Obstetrics and Gynecology

## 2024-02-12 DIAGNOSIS — N809 Endometriosis, unspecified: Secondary | ICD-10-CM | POA: Insufficient documentation

## 2024-02-12 DIAGNOSIS — N736 Female pelvic peritoneal adhesions (postinfective): Secondary | ICD-10-CM | POA: Diagnosis not present

## 2024-02-12 DIAGNOSIS — K219 Gastro-esophageal reflux disease without esophagitis: Secondary | ICD-10-CM | POA: Insufficient documentation

## 2024-02-12 DIAGNOSIS — N838 Other noninflammatory disorders of ovary, fallopian tube and broad ligament: Secondary | ICD-10-CM | POA: Diagnosis not present

## 2024-02-12 DIAGNOSIS — R102 Pelvic and perineal pain: Secondary | ICD-10-CM | POA: Diagnosis not present

## 2024-02-12 DIAGNOSIS — G8929 Other chronic pain: Secondary | ICD-10-CM | POA: Insufficient documentation

## 2024-02-12 DIAGNOSIS — Z01812 Encounter for preprocedural laboratory examination: Secondary | ICD-10-CM

## 2024-02-12 DIAGNOSIS — Z30432 Encounter for removal of intrauterine contraceptive device: Secondary | ICD-10-CM | POA: Diagnosis not present

## 2024-02-12 HISTORY — PX: HYSTERECTOMY, TOTAL, LAPAROSCOPIC, ROBOT-ASSISTED WITH SALPINGECTOMY: SHX7587

## 2024-02-12 HISTORY — PX: CYSTOSCOPY: SHX5120

## 2024-02-12 HISTORY — PX: IUD REMOVAL: SHX5392

## 2024-02-12 HISTORY — PX: LAPAROSCOPIC LYSIS OF ADHESIONS: SHX5905

## 2024-02-12 HISTORY — PX: EXCISION, ENDOMETRIOSIS, ROBOTIC ASSISTED, LAPAROSCOPIC: SHX7564

## 2024-02-12 LAB — POCT PREGNANCY, URINE: Preg Test, Ur: NEGATIVE

## 2024-02-12 SURGERY — HYSTERECTOMY, TOTAL, LAPAROSCOPIC, ROBOT-ASSISTED WITH SALPINGECTOMY
Anesthesia: General | Site: Uterus

## 2024-02-12 MED ORDER — GABAPENTIN 300 MG PO CAPS: 300.0000 mg | ORAL_CAPSULE | Freq: Every day | ORAL | 0 refills | Status: AC

## 2024-02-12 MED ORDER — KETAMINE HCL 10 MG/ML IJ SOLN
INTRAMUSCULAR | Status: DC | PRN
  Administered 2024-02-12: 20 mg via INTRAVENOUS
  Administered 2024-02-12: 10 mg via INTRAVENOUS

## 2024-02-12 MED ORDER — FENTANYL CITRATE (PF) 100 MCG/2ML IJ SOLN
INTRAMUSCULAR | Status: AC
  Filled 2024-02-12: qty 2

## 2024-02-12 MED ORDER — MIDAZOLAM HCL 2 MG/2ML IJ SOLN
INTRAMUSCULAR | Status: DC | PRN
  Administered 2024-02-12: 2 mg via INTRAVENOUS

## 2024-02-12 MED ORDER — HYDROMORPHONE HCL 1 MG/ML IJ SOLN: 0.2500 mg | INTRAMUSCULAR | Status: DC | PRN

## 2024-02-12 MED ORDER — OXYCODONE HCL 5 MG PO TABS
5.0000 mg | ORAL_TABLET | Freq: Once | ORAL | Status: AC | PRN
  Administered 2024-02-12: 5 mg via ORAL

## 2024-02-12 MED ORDER — CELECOXIB 200 MG PO CAPS: 200.0000 mg | ORAL_CAPSULE | Freq: Two times a day (BID) | ORAL | 0 refills | Status: AC

## 2024-02-12 MED ORDER — ACETAMINOPHEN 500 MG PO TABS
1000.0000 mg | ORAL_TABLET | ORAL | Status: AC
  Administered 2024-02-12: 1000 mg via ORAL

## 2024-02-12 MED ORDER — KETAMINE HCL 50 MG/5ML IJ SOSY
PREFILLED_SYRINGE | INTRAMUSCULAR | Status: AC
Start: 2024-02-12 — End: 2024-02-12
  Filled 2024-02-12: qty 5

## 2024-02-12 MED ORDER — SUCCINYLCHOLINE CHLORIDE 200 MG/10ML IV SOSY
PREFILLED_SYRINGE | INTRAVENOUS | Status: AC
  Filled 2024-02-12: qty 10

## 2024-02-12 MED ORDER — KETOROLAC TROMETHAMINE 30 MG/ML IJ SOLN
INTRAMUSCULAR | Status: AC
  Filled 2024-02-12: qty 2

## 2024-02-12 MED ORDER — BUPIVACAINE HCL 0.5 % IJ SOLN
INTRAMUSCULAR | Status: DC | PRN
  Administered 2024-02-12: 7 mL

## 2024-02-12 MED ORDER — ACETAMINOPHEN 500 MG PO TABS
ORAL_TABLET | ORAL | Status: AC
  Filled 2024-02-12: qty 2

## 2024-02-12 MED ORDER — OXYCODONE HCL 5 MG PO TABS: 5.0000 mg | ORAL_TABLET | ORAL | 0 refills | Status: AC | PRN

## 2024-02-12 MED ORDER — DEXAMETHASONE SODIUM PHOSPHATE 10 MG/ML IJ SOLN
INTRAMUSCULAR | Status: DC | PRN
  Administered 2024-02-12: 10 mg via INTRAVENOUS

## 2024-02-12 MED ORDER — ACETAMINOPHEN EXTRA STRENGTH 500 MG PO TABS: 1000.0000 mg | ORAL_TABLET | Freq: Four times a day (QID) | ORAL | 0 refills | Status: AC

## 2024-02-12 MED ORDER — PROPOFOL 1000 MG/100ML IV EMUL
INTRAVENOUS | Status: AC
  Filled 2024-02-12: qty 100

## 2024-02-12 MED ORDER — OXYCODONE HCL 5 MG/5ML PO SOLN: 5.0000 mg | Freq: Once | ORAL | Status: AC | PRN

## 2024-02-12 MED ORDER — ONDANSETRON HCL 4 MG/2ML IJ SOLN
INTRAMUSCULAR | Status: DC | PRN
  Administered 2024-02-12: 4 mg via INTRAVENOUS

## 2024-02-12 MED ORDER — LIDOCAINE HCL (CARDIAC) PF 100 MG/5ML IV SOSY
PREFILLED_SYRINGE | INTRAVENOUS | Status: DC | PRN
  Administered 2024-02-12: 80 mg via INTRAVENOUS

## 2024-02-12 MED ORDER — FENTANYL CITRATE (PF) 100 MCG/2ML IJ SOLN
INTRAMUSCULAR | Status: DC | PRN
  Administered 2024-02-12: 100 ug via INTRAVENOUS
  Administered 2024-02-12: 50 ug via INTRAVENOUS

## 2024-02-12 MED ORDER — LACTATED RINGERS IV SOLN: INTRAVENOUS | Status: DC

## 2024-02-12 MED ORDER — ROCURONIUM BROMIDE 100 MG/10ML IV SOLN
INTRAVENOUS | Status: DC | PRN
Start: 2024-02-12 — End: 2024-02-12
  Administered 2024-02-12: 50 mg via INTRAVENOUS
  Administered 2024-02-12: 20 mg via INTRAVENOUS

## 2024-02-12 MED ORDER — 0.9 % SODIUM CHLORIDE (POUR BTL) OPTIME
TOPICAL | Status: DC | PRN
Start: 2024-02-12 — End: 2024-02-12
  Administered 2024-02-12: 200 mL
  Administered 2024-02-12: 500 mL

## 2024-02-12 MED ORDER — SUGAMMADEX SODIUM 200 MG/2ML IV SOLN
INTRAVENOUS | Status: DC | PRN
  Administered 2024-02-12: 200 mg via INTRAVENOUS

## 2024-02-12 MED ORDER — DEXAMETHASONE SODIUM PHOSPHATE 10 MG/ML IJ SOLN
INTRAMUSCULAR | Status: AC
  Filled 2024-02-12: qty 2

## 2024-02-12 MED ORDER — ROCURONIUM BROMIDE 10 MG/ML (PF) SYRINGE
PREFILLED_SYRINGE | INTRAVENOUS | Status: AC
  Filled 2024-02-12: qty 10

## 2024-02-12 MED ORDER — SUCCINYLCHOLINE CHLORIDE 200 MG/10ML IV SOSY
PREFILLED_SYRINGE | INTRAVENOUS | Status: DC | PRN
  Administered 2024-02-12: 100 mg via INTRAVENOUS

## 2024-02-12 MED ORDER — ONDANSETRON HCL 4 MG/2ML IJ SOLN
INTRAMUSCULAR | Status: AC
  Filled 2024-02-12: qty 4

## 2024-02-12 MED ORDER — PROPOFOL 500 MG/50ML IV EMUL
INTRAVENOUS | Status: DC | PRN
  Administered 2024-02-12: 175 ug/kg/min via INTRAVENOUS

## 2024-02-12 MED ORDER — POVIDONE-IODINE 10 % EX SWAB
2.0000 | Freq: Once | CUTANEOUS | Status: AC
  Administered 2024-02-12: 2 via TOPICAL

## 2024-02-12 MED ORDER — PROPOFOL 1000 MG/100ML IV EMUL
INTRAVENOUS | Status: AC
Start: 2024-02-12 — End: 2024-02-12
  Filled 2024-02-12: qty 100

## 2024-02-12 MED ORDER — CLINDAMYCIN PHOSPHATE 900 MG/50ML IV SOLN
INTRAVENOUS | Status: AC
  Filled 2024-02-12: qty 50

## 2024-02-12 MED ORDER — METHYLENE BLUE (ANTIDOTE) 1 % IV SOLN
INTRAVENOUS | Status: AC
  Filled 2024-02-12: qty 10

## 2024-02-12 MED ORDER — KETOROLAC TROMETHAMINE 30 MG/ML IJ SOLN
INTRAMUSCULAR | Status: DC | PRN
  Administered 2024-02-12: 30 mg via INTRAVENOUS

## 2024-02-12 MED ORDER — PROPOFOL 10 MG/ML IV BOLUS
INTRAVENOUS | Status: DC | PRN
  Administered 2024-02-12: 200 mg via INTRAVENOUS

## 2024-02-12 MED ORDER — MIDAZOLAM HCL 2 MG/2ML IJ SOLN
INTRAMUSCULAR | Status: AC
Start: 2024-02-12 — End: 2024-02-12
  Filled 2024-02-12: qty 2

## 2024-02-12 MED ORDER — CLINDAMYCIN PHOSPHATE 900 MG/50ML IV SOLN
900.0000 mg | INTRAVENOUS | Status: AC
  Administered 2024-02-12: 900 mg via INTRAVENOUS

## 2024-02-12 MED ORDER — ONDANSETRON 4 MG PO TBDP: 4.0000 mg | ORAL_TABLET | Freq: Three times a day (TID) | ORAL | 0 refills | Status: AC | PRN

## 2024-02-12 MED ORDER — ORAL CARE MOUTH RINSE: 15.0000 mL | Freq: Once | OROMUCOSAL | Status: DC

## 2024-02-12 MED ORDER — DEXMEDETOMIDINE HCL IN NACL 80 MCG/20ML IV SOLN
INTRAVENOUS | Status: DC | PRN
  Administered 2024-02-12: 8 ug via INTRAVENOUS
  Administered 2024-02-12: 12 ug via INTRAVENOUS

## 2024-02-12 MED ORDER — BUPIVACAINE HCL (PF) 0.5 % IJ SOLN
INTRAMUSCULAR | Status: AC
  Filled 2024-02-12: qty 30

## 2024-02-12 MED ORDER — OXYCODONE HCL 5 MG PO TABS
ORAL_TABLET | ORAL | Status: AC
  Filled 2024-02-12: qty 1

## 2024-02-12 MED ORDER — LIDOCAINE HCL (PF) 2 % IJ SOLN
INTRAMUSCULAR | Status: AC
  Filled 2024-02-12: qty 10

## 2024-02-12 MED ORDER — GENTAMICIN SULFATE 40 MG/ML IJ SOLN
5.0000 mg/kg | INTRAVENOUS | Status: DC
  Filled 2024-02-12: qty 12

## 2024-02-12 MED ORDER — PROPOFOL 10 MG/ML IV BOLUS
INTRAVENOUS | Status: AC
  Filled 2024-02-12: qty 20

## 2024-02-12 SURGICAL SUPPLY — 70 items
BAG URINE DRAIN 2000ML AR STRL (UROLOGICAL SUPPLIES) ×4 IMPLANT
BARRIER ADHS 3X4 INTERCEED (GAUZE/BANDAGES/DRESSINGS) IMPLANT
BLADE SURG SZ11 CARB STEEL (BLADE) ×4 IMPLANT
CABLE HIGH FREQUENCY MONO STRZ (ELECTRODE) ×4 IMPLANT
CANNULA CAP OBTURATR AIRSEAL 8 (CAP) ×4 IMPLANT
CATH ROBINSON RED A/P 16FR (CATHETERS) ×4 IMPLANT
CATH URTH 16FR FL 2W BLN LF (CATHETERS) ×4 IMPLANT
COVER TIP SHEARS 8 DVNC (MISCELLANEOUS) ×4 IMPLANT
DERMABOND ADVANCED .7 DNX12 (GAUZE/BANDAGES/DRESSINGS) ×4 IMPLANT
DRAPE ARM DVNC X/XI (DISPOSABLE) ×12 IMPLANT
DRAPE COLUMN DVNC XI (DISPOSABLE) ×4 IMPLANT
DRAPE STERI POUCH LG 24X46 STR (DRAPES) IMPLANT
DRIVER NDL MEGA 8 DVNC XI (INSTRUMENTS) ×4 IMPLANT
DRIVER NDLE MEGA DVNC XI (INSTRUMENTS) ×4 IMPLANT
ELECTRODE REM PT RTRN 9FT ADLT (ELECTROSURGICAL) ×5 IMPLANT
FORCEPS BPLR FENES DVNC XI (FORCEP) ×4 IMPLANT
GAUZE 4X4 16PLY ~~LOC~~+RFID DBL (SPONGE) ×4 IMPLANT
GLOVE BIO SURGEON STRL SZ7 (GLOVE) ×16 IMPLANT
GLOVE BIOGEL PI IND STRL 7.5 (GLOVE) ×16 IMPLANT
GLOVE INDICATOR 7.5 STRL GRN (GLOVE) ×5 IMPLANT
GLOVE SURG SYN 8.0 PF PI (GLOVE) ×4 IMPLANT
GOWN STRL REUS W/ TWL LRG LVL3 (GOWN DISPOSABLE) ×15 IMPLANT
GOWN STRL REUS W/ TWL XL LVL3 (GOWN DISPOSABLE) ×4 IMPLANT
IRRIGATION STRYKERFLOW (MISCELLANEOUS) IMPLANT
IRRIGATOR SUCT 8 DISP DVNC XI (IRRIGATION / IRRIGATOR) IMPLANT
IV LACTATED RINGERS 1000ML (IV SOLUTION) ×4 IMPLANT
IV NS 1000ML BAXH (IV SOLUTION) ×4 IMPLANT
KIT PINK PAD W/HEAD ARM REST (MISCELLANEOUS) ×4 IMPLANT
KIT TURNOVER CYSTO (KITS) ×4 IMPLANT
LABEL OR SOLS (LABEL) ×4 IMPLANT
LHOOK LAP DISP 36CM (ELECTROSURGICAL) IMPLANT
LIGASURE VESSEL 5MM BLUNT TIP (ELECTROSURGICAL) IMPLANT
MANIFOLD NEPTUNE II (INSTRUMENTS) ×4 IMPLANT
MANIPULATOR UTERINE 4.5 ZUMI (MISCELLANEOUS) IMPLANT
MANIPULATOR VCARE LG CRV RETR (MISCELLANEOUS) IMPLANT
MANIPULATOR VCARE SML CRV RETR (MISCELLANEOUS) IMPLANT
MANIPULATOR VCARE STD CRV RETR (MISCELLANEOUS) IMPLANT
NDL 21 GA WING INFUSION (NEEDLE) IMPLANT
NEEDLE 21 GA WING INFUSION (NEEDLE) IMPLANT
NS IRRIG 500ML POUR BTL (IV SOLUTION) ×4 IMPLANT
OBTURATOR OPTICALSTD 8 DVNC (TROCAR) ×4 IMPLANT
OCCLUDER COLPOPNEUMO (BALLOONS) ×5 IMPLANT
PACK GYN LAPAROSCOPIC (MISCELLANEOUS) ×4 IMPLANT
PAD OB MATERNITY 11 LF (PERSONAL CARE ITEMS) ×4 IMPLANT
PAD PREP OB/GYN DISP 24X41 (PERSONAL CARE ITEMS) ×5 IMPLANT
POWDER SURGICEL 3.0 GRAM (HEMOSTASIS) IMPLANT
SCISSORS LAP 5X35 DISP (ENDOMECHANICALS) IMPLANT
SCISSORS MNPLR CVD DVNC XI (INSTRUMENTS) ×4 IMPLANT
SCRUB CHG 4% DYNA-HEX 4OZ (MISCELLANEOUS) ×4 IMPLANT
SEAL UNIV 5-12 XI (MISCELLANEOUS) ×8 IMPLANT
SEALER VESSEL EXT DVNC XI (MISCELLANEOUS) IMPLANT
SET CYSTO W/LG BORE CLAMP LF (SET/KITS/TRAYS/PACK) ×4 IMPLANT
SET TUBE FILTERED XL AIRSEAL (SET/KITS/TRAYS/PACK) ×4 IMPLANT
SET TUBE SMOKE EVAC HIGH FLOW (TUBING) ×4 IMPLANT
SLEEVE Z-THREAD 5X100MM (TROCAR) ×4 IMPLANT
SOLUTION ELECTROSURG ANTI STCK (MISCELLANEOUS) ×4 IMPLANT
SOLUTION PREP PVP 2OZ (MISCELLANEOUS) ×4 IMPLANT
STRIP CLOSURE SKIN 1/4X4 (GAUZE/BANDAGES/DRESSINGS) IMPLANT
SURGILUBE 2OZ TUBE FLIPTOP (MISCELLANEOUS) ×4 IMPLANT
SUT MNCRL AB 4-0 PS2 18 (SUTURE) ×4 IMPLANT
SUT STRATA PDS 0 30 CT-2.5 (SUTURE) ×4 IMPLANT
SUTURE MNCRL 4-0 27XMF (SUTURE) ×4 IMPLANT
SYR 50ML LL SCALE MARK (SYRINGE) ×4 IMPLANT
SYR 5ML LL (SYRINGE) IMPLANT
SYSTEM BAG RETRIEVAL 10MM (BASKET) IMPLANT
TIP ENDOSCOPIC SURGICEL (TIP) ×5 IMPLANT
TRAP FLUID SMOKE EVACUATOR (MISCELLANEOUS) ×4 IMPLANT
TROCAR Z-THREAD FIOS 5X100MM (TROCAR) ×4 IMPLANT
TUBING ART PRESS 48 MALE/FEM (TUBING) IMPLANT
WATER STERILE IRR 500ML POUR (IV SOLUTION) ×4 IMPLANT

## 2024-02-12 NOTE — Anesthesia Postprocedure Evaluation (Signed)
 Anesthesia Post Note  Patient: Hannah Ford  Procedure(s) Performed: HYSTERECTOMY, TOTAL, LAPAROSCOPIC, ROBOT-ASSISTED WITH SALPINGECTOMY (Bilateral: Uterus) LYSIS, ADHESIONS, LAPAROSCOPIC (Pelvis) EXCISION, ENDOMETRIOSIS, ROBOTIC ASSISTED, LAPAROSCOPIC (Pelvis) CYSTOSCOPY (Bladder) REMOVAL, INTRAUTERINE DEVICE  Patient location during evaluation: PACU Anesthesia Type: General Level of consciousness: awake and alert Pain management: pain level controlled Vital Signs Assessment: post-procedure vital signs reviewed and stable Respiratory status: spontaneous breathing, nonlabored ventilation, respiratory function stable and patient connected to nasal cannula oxygen Cardiovascular status: blood pressure returned to baseline and stable Postop Assessment: no apparent nausea or vomiting Anesthetic complications: no   No notable events documented.   Last Vitals:  Vitals:   02/12/24 1400 02/12/24 1415  BP: 112/69 112/73  Pulse: (!) 57 (!) 54  Resp: 19 17  Temp:    SpO2: 100% 100%    Last Pain:  Vitals:   02/12/24 1415  TempSrc:   PainSc: Asleep                 Lendia LITTIE Mae

## 2024-02-12 NOTE — Interval H&P Note (Signed)
 History and Physical Interval Note:  02/12/2024 9:54 AM  Hannah Ford  has presented today for surgery, with the diagnosis of endometriosis.  The various methods of treatment have been discussed with the patient and family. After consideration of risks, benefits and other options for treatment, the patient has consented to  Procedure(s): HYSTERECTOMY, TOTAL, LAPAROSCOPIC, ROBOT-ASSISTED WITH SALPINGECTOMY (Bilateral) LYSIS, ADHESIONS, LAPAROSCOPIC (N/A) EXCISION, ENDOMETRIOSIS, ROBOTIC ASSISTED, LAPAROSCOPIC (N/A) CYSTOSCOPY (N/A) as a surgical intervention.  The patient's history has been reviewed, patient examined, no change in status, stable for surgery.  I have reviewed the patient's chart and labs.  Questions were answered to the patient's satisfaction.     Heather Penton

## 2024-02-12 NOTE — Progress Notes (Signed)
 Walked patient around unit to help relieve gas pain. Patient tolerated it well and expelled some gas.

## 2024-02-12 NOTE — Transfer of Care (Signed)
 Immediate Anesthesia Transfer of Care Note  Patient: Hannah Ford  Procedure(s) Performed: HYSTERECTOMY, TOTAL, LAPAROSCOPIC, ROBOT-ASSISTED WITH SALPINGECTOMY (Bilateral: Uterus) LYSIS, ADHESIONS, LAPAROSCOPIC (Pelvis) EXCISION, ENDOMETRIOSIS, ROBOTIC ASSISTED, LAPAROSCOPIC (Pelvis) CYSTOSCOPY (Bladder) REMOVAL, INTRAUTERINE DEVICE  Patient Location: PACU  Anesthesia Type:General  Level of Consciousness: drowsy and patient cooperative  Airway & Oxygen Therapy: Patient Spontanous Breathing and Patient connected to face mask oxygen  Post-op Assessment: Report given to RN, Post -op Vital signs reviewed and stable, and Patient moving all extremities X 4  Post vital signs: Reviewed and stable  Last Vitals:  Vitals Value Taken Time  BP 101/64 02/12/24 13:38  Temp 36.2 C 02/12/24 13:38  Pulse 58 02/12/24 13:40  Resp 17 02/12/24 13:40  SpO2 100 % 02/12/24 13:40  Vitals shown include unfiled device data.  Last Pain:  Vitals:   02/12/24 1338  TempSrc:   PainSc: Asleep         Complications: No notable events documented.

## 2024-02-12 NOTE — Anesthesia Preprocedure Evaluation (Addendum)
 Anesthesia Evaluation  Patient identified by MRN, date of birth, ID band Patient awake    Reviewed: Allergy & Precautions, NPO status , Patient's Chart, lab work & pertinent test results  History of Anesthesia Complications Negative for: history of anesthetic complications  Airway Mallampati: II  TM Distance: >3 FB Neck ROM: full    Dental no notable dental hx.    Pulmonary neg pulmonary ROS   Pulmonary exam normal        Cardiovascular negative cardio ROS Normal cardiovascular exam     Neuro/Psych  Headaches  Neuromuscular disease  negative psych ROS   GI/Hepatic Neg liver ROS,GERD  Medicated,,  Endo/Other  negative endocrine ROS    Renal/GU negative Renal ROS  negative genitourinary   Musculoskeletal   Abdominal   Peds  Hematology  (+) Blood dyscrasia, anemia   Anesthesia Other Findings Past Medical History: No date: Abnormal menstrual periods No date: Allergy No date: Anemia No date: Dyspnea No date: Endometriosis No date: Family history of adverse reaction to anesthesia     Comment:  Dad woke up during hip surgery No date: Frequent headaches     Comment:  otc med prn No date: GERD (gastroesophageal reflux disease) No date: Palpitations No date: Pneumonia No date: Pre-diabetes No date: Urticaria  Past Surgical History: 10/09/2022: COLONOSCOPY WITH PROPOFOL ; N/A     Comment:  Procedure: COLONOSCOPY WITH PROPOFOL ;  Surgeon: Onita Elspeth Sharper, DO;  Location: [facility] END[facility];  Service:               Endoscopy;  Laterality: N/A; 10/09/2022: ESOPHAGOGASTRODUODENOSCOPY (EGD) WITH PROPOFOL ; N/A     Comment:  Procedure: ESOPHAGOGASTRODUODENOSCOPY (EGD) WITH               PROPOFOL ;  Surgeon: Onita Elspeth Sharper, DO;  Location:              [facility] END[facility];  Service: Endoscopy;  Laterality: N/A; 08/15/2022: INTRAUTERINE DEVICE (IUD) INSERTION; N/A     Comment:  Procedure: INTRAUTERINE DEVICE  (IUD) INSERTION, MIRENA ;               Surgeon: Verdon Keen, MD;  Location: [facility] ORS;                Service: Gynecology;  Laterality: N/A; 02/14/2016: LAPAROSCOPY; N/A     Comment:  Procedure: LAPAROSCOPY DIAGNOSTIC FULGERATION OF PELVIC               ENDOMETRIOSIS;  Surgeon: Dickie Carder, MD;                Location: [facility] ORS;  Service: Gynecology;  Laterality: N/A;              @ 2hrs. 08/15/2022: ROBOTIC ASSISTED LAPAROSCOPIC LYSIS OF ADHESION     Comment:  Procedure: XI ROBOTIC ASSISTED LAPAROSCOPIC LYSIS OF               ADHESION;  Surgeon: Jordis Laneta FALCON, MD;  Location: [facility]               ORS;  Service: General;;  WITH REPAIR MESENTERY No date: WISDOM TOOTH EXTRACTION 08/15/2022: XI ROBOT ASSISTED DIAGNOSTIC LAPAROSCOPY; N/A     Comment:  Procedure: XI ROBOT ASSISTED OPERATIVE LAPAROSCOPY,               EXCISION OF ENDOMETRIOSIS, PERITONEAL BIOPSIES LYSIS OF  ADHESIONS, PERITONEAL STRIPPING;  Surgeon: Verdon Keen, MD;  Location: [facility] ORS;  Service: Gynecology;                Laterality: N/A;  BMI    Body Mass Index: 33.31 kg/m      Reproductive/Obstetrics negative OB ROS                              Anesthesia Physical Anesthesia Plan  ASA: 2  Anesthesia Plan: General ETT   Post-op Pain Management: Dilaudid  IV, Toradol  IV (intra-op)* and Tylenol  PO (pre-op)*   Induction: Intravenous  PONV Risk Score and Plan: 3 and Ondansetron , Dexamethasone , Midazolam  and Treatment may vary due to age or medical condition  Airway Management Planned: Oral ETT  Additional Equipment:   Intra-op Plan:   Post-operative Plan: Extubation in OR  Informed Consent: I have reviewed the patients History and Physical, chart, labs and discussed the procedure including the risks, benefits and alternatives for the proposed anesthesia with the patient or authorized representative who has indicated his/her understanding and  acceptance.     Dental Advisory Given  Plan Discussed with: Anesthesiologist, CRNA and Surgeon  Anesthesia Plan Comments: (Patient consented for risks of anesthesia including but not limited to:  - adverse reactions to medications - damage to eyes, teeth, lips or other oral mucosa - nerve damage due to positioning  - sore throat or hoarseness - Damage to heart, brain, nerves, lungs, other parts of body or loss of life  Patient voiced understanding and assent.)         Anesthesia Quick Evaluation

## 2024-02-12 NOTE — Anesthesia Procedure Notes (Signed)
 Procedure Name: Intubation Date/Time: 02/12/2024 11:14 AM  Performed by: Lennie Lamarr HERO, CRNAPre-anesthesia Checklist: Patient identified, Emergency Drugs available, Suction available and Patient being monitored Patient Re-evaluated:Patient Re-evaluated prior to induction Oxygen Delivery Method: Circle System Utilized Preoxygenation: Pre-oxygenation with 100% oxygen Induction Type: IV induction and Rapid sequence Ventilation: Mask ventilation without difficulty Laryngoscope Size: McGrath and 4 Grade View: Grade I Tube type: Oral Tube size: 7.0 mm Number of attempts: 1 Airway Equipment and Method: Stylet and Oral airway Placement Confirmation: ETT inserted through vocal cords under direct vision, positive ETCO2 and breath sounds checked- equal and bilateral Secured at: 21 cm Tube secured with: Tape Dental Injury: Teeth and Oropharynx as per pre-operative assessment

## 2024-02-12 NOTE — Op Note (Signed)
 Ermalinda B Caine PROCEDURE DATE: 02/12/2024  PREOPERATIVE DIAGNOSIS: Endometriosis, chronic pelvic pain, has failed medical management and prior conservative surgical management POSTOPERATIVE DIAGNOSIS: The same PROCEDURE:  HYSTERECTOMY, TOTAL, LAPAROSCOPIC, ROBOT-ASSISTED WITH SALPINGECTOMY: 58571 (CPT)  LYSIS, ADHESIONS, LAPAROSCOPIC: 58660 (CPT)  EXCISION, ENDOMETRIOSIS, ROBOTIC ASSISTED, LAPAROSCOPIC: 41337 (CPT)  CYSTOSCOPY: 52000 (CPT)  REMOVAL, INTRAUTERINE DEVICE: 41698 (CPT)   SURGEON:  Dr. Heather Penton, MD ASSISTANT: Bobbette Brunswick, CNM; RNFA Anesthesiologist:  Anesthesiologist: Chesley Lendia CROME, MD CRNA: Lennie Lamarr HERO, CRNA; Lacretia Camelia NOVAK, CRNA  An experienced assistant was required given the standard of surgical care given the complexity of the case.  This assistant was needed for exposure, dissection, suctioning, retraction, instrument exchange: provided expert assistance required for safety during the procedure.   INDICATIONS: 28 y.o. F  here for definitive surgical management secondary to the indications listed under preoperative diagnoses; please see preoperative note for further details.  Risks of surgery were discussed with the patient including but not limited to: bleeding which may require transfusion or reoperation; infection which may require antibiotics; injury to bowel, bladder, ureters or other surrounding organs; need for additional procedures; thromboembolic phenomenon, incisional problems and other postoperative/anesthesia complications. Written informed consent was obtained.    FINDINGS:    External genitalia, vaginal canal and cervix negative for lesions. Intraoperative findings revealed a normal upper abdomen including bowel, liver, diaphragmatic surfaces, stomach, and omentum.  I lose closely in the right upper quadrant and found no endometriosis along her diaphragm, liver, or falciform ligament.  Her gallbladder was present and appropriate, with  no edema.  Left upper quadrant also appeared to be within normal limits.  Endometriosis scarring was noted along the bilateral pelvic sidewalls and the posterior cul-de-sac was obliterated.  Rectum was very close to the posterior vaginal cuff.  Anteriorly, the bladder appeared well scarred to the uterus.  In the left lower quadrant, the sigmoid colon was densely adherent to the pelvic sidewall, and wrapped around the left fallopian tube.  Appendix appeared to be retrocecal and within normal limits.   ANESTHESIA:    General INTRAVENOUS FLUIDS:800  ml ESTIMATED BLOOD LOSS:10 ml URINE OUTPUT: 600 ml  SPECIMENS: Uterus, cervix, bilateral fallopian tubes and  IUD  COMPLICATIONS: None immediate   RATLH/BS:  PROCEDURE IN DETAIL: After informed consent was obtained, the patient was taken to the operating room where general anesthesia was obtained without difficulty. The patient was positioned in the dorsal lithotomy position in [place] stirrups and her arms were carefully tucked at her sides and the usual precautions were taken. Deep Trendelenburg (20-25 deg) was established to confirm that she does not shift on the table.  She was prepped and draped in normal sterile fashion.  Time-out was performed and a Foley catheter was placed into the bladder.  The IUD was removed without complication with traction on the strings.  A standard VCare uterine manipulator was then placed in the uterus without incident.  Preoperative prophylactic antibiotics were given through her iv.  After infiltration of local anesthetic at the proposed trocar sites, an 8 mm incision was created at the umbilicus point, and an AirSeal 5mm was placed under direct visualization, after confirmation of OG tube working well. Pneumoperitoneum was created to a pressure of 15 mm Hg. The camera was placed and the abdomin surveyed, noting intact bowel below the site of entry. A survey of the pelvis and upper abdomen revealed the above  findings. One right and one left lateral 8-mm robotic ports were placed under direct visualization.  The patient was placed in deepTrendelenburg and the bowel was displaced up into the upper abdomen. The robot was left side docked. The instruments were placed under direct visualization.   The ureters were identified bilaterally coursing outside of the operative field.   Pelvic adhesions and sigmoid colon adhesions were removed using sharp and cautery.  The left sided sigmoid was replaced into its normal anatomic position.  Round ligaments were divided on each side with the EndoShears and the retroperitoneal space was opened bilaterally. The posterior leaflet of the broad was taken down to the level of the IP ligament. The anterior leaflet of the broad ligament was carefully taken down to the midline.  A bladder flap was created and the bladder was dissected down off the lower uterine segment and cervix using endoshears and electrocautery.   The Fallopian tubes were divided from the ovaries, and care taken to hemostatically transect the utero-ovarian ligament. The peritoneum was taken down to the level of the internal os, and the uterine arteries skeletonized. With strong cephalad pressure from the V-care, bipolar cautery was used to seal and transect the uterine arteries, and the pedicles allowed to fall away laterally.  A colpotomy was performed circumferentially along the V-Care ring with monopolar electrocautery and the cervix was incised from the vagina using the laparoscopic scissors. The specimen was removed through the vagina.  A pneumo balloon was placed in the vagina and the vaginal cuff was then closed in a running continuous fashion using the  0 V-Lock suture with careful attention to include the vaginal cuff angles, the uterosacral ligaments and the vaginal mucosa within the closure.  I also was very aware of the edge of the rectum, which was less than a centimeter from the posterior vaginal  cuff.  Hemostasis was secured with intraabdominal pressure and review of all surgical sites. The intraperitoneal pressure was dropped, and all planes of dissection, vascular pedicles and the vaginal cuff were found to be hemostatic.  A single large Interceed was placed on the abraded edges to prevent retroperitoneal peritoneum mobilization of the bilateral ovaries and adhesions between the rectum and the vaginal cuff.  The robot was undocked.   Attention was turned to the bladder and cystoscopy showed vigorous bilateral ureteral jets.  No stitches were visualized in the bladder during cystoscopy.  There is no evidence of endometriosis on the bladder mucosa.  The lateral trocars were removed under visualization.  The CO2 gas was released and several deep breaths given to remove any remaining CO2 from the peritoneal cavity.  The skin incisions were closed with 4-0 Monocryl subcuticular stitch and Dermabond.    Anesthesia was reversed without difficulty.  The patient tolerated the procedure well.  Sponge, lap and needle counts were correct x2.  The patient was taken to recovery room in excellent condition.

## 2024-02-12 NOTE — Discharge Instructions (Signed)
Discharge instructions after  robotically-assisted total laparoscopic hysterectomy   For the next three days, take ibuprofen and acetaminophen on a schedule, every 8 hours. You can take them together or you can intersperse them, and take one every four hours. I also gave you gabapentin for nighttime, to help you sleep and also to control pain. Take gabapentin medicines at night for at least the next 3 nights. You also have a narcotic, oxycodone, to take as needed if the above medicines don't help.  Postop constipation is a major cause of pain. Stay well hydrated, walk as you tolerate, and take over the counter senna as well as stool softeners if you need them.   Signs and Symptoms to Report Call our office at [phone] if you have any of the following.   Fever over 100.4 degrees or higher  Severe stomach pain not relieved with pain medications  Bright red bleeding that's heavier than a period that does not slow with rest  To go the bathroom a lot (frequency), you can't hold your urine (urgency), or it hurts when you empty your bladder (urinate)  Chest pain  Shortness of breath  Pain in the calves of your legs  Severe nausea and vomiting not relieved with anti-nausea medications  Signs of infection around your wounds, such as redness, hot to touch, swelling, green/yellow drainage (like pus), bad smelling discharge  Any concerns  What You Can Expect after Surgery  You may see some pink tinged, bloody fluid and bruising around the wound. This is normal.  You may notice shoulder and neck pain. This is caused by the gas used during surgery to expand your abdomen so your surgeon could get to the uterus easier.  You may have a sore throat because of the tube in your mouth during general anesthesia. This will go away in 2 to 3 days.  You may have some stomach cramps.  You may notice spotting on your panties.  You may have pain around the incision sites.   Activities after Your  Discharge Follow these guidelines to help speed your recovery at home:  Do the coughing and deep breathing as you did in the hospital for 2 weeks. Use the small blue breathing device, called the incentive spirometer for 2 weeks.  Don't drive if you are in pain or taking narcotic pain medicine. You may drive when you can safely slam on the brakes, turn the wheel forcefully, and rotate your torso comfortably. This is typically 1-2 weeks. Practice in a parking lot or side street prior to attempting to drive regularly.   Ask others to help with household chores for 4 weeks.  Do not lift anything heavier that 10 pounds for 4-6 weeks. This includes pets, children, and groceries.  Don't do strenuous activities, exercises, or sports like vacuuming, tennis, squash, etc. until your doctor says it is safe to do so. ---Maintain pelvic rest for 12 weeks. This means nothing in the vagina or rectum at all (no douching, tampons, intercourse) for 12 weeks.   Walk as you feel able. Rest often since it may take two or three weeks for your energy level to return to normal.   You may climb stairs  Avoid constipation:   -Eat fruits, vegetables, and whole grains. Eat small meals as your appetite will take time to return to normal.   -Drink 6 to 8 glasses of water each day unless your doctor has told you to limit your fluids.   -Use a laxative or   stool softener as needed if constipation becomes a problem. You may take Miralax, metamucil, Citrucil, Colace, Senekot, FiberCon, etc. If this does not relieve the constipation, try two tablespoons of Milk Of Magnesia every 8 hours until your bowels move.   You may shower. Gently wash the wounds with a mild soap and water. Pat dry.  Do not get in a hot tub, swimming pool, etc. for 6 weeks.  Do not use lotions, oils, powders on the wounds.  Do not douche, use tampons, or have sex until your doctor says it is okay.  Take your pain medicine when you need it. The medicine may not  work as well if the pain is bad.  Take the medicines you were taking before surgery. Other medications you will need are pain medications (Norco or Percocet) and nausea medications (Zofran).   Here is a helpful article from the website [employer], regarding constipation  Here are reasons why constipation occurs after surgery: 1) During the operation and in the recovery room, most people are given opioid pain medication, primarily through an IV, to treat moderate or severe pain. Intravenous opioids include morphine, Dilaudid and fentanyl. After surgery, patients are often prescribed opioid pain medication to take by mouth at home, including codeine, Vicodin, Norco, and Percocet. All of these medications cause constipation by slowing down the movement of your intestine. 2) Changes in your diet before surgery can be another culprit. It is common to get specific instructions to change how you normally eat or drink before your surgery, like only having liquids the day before or not having anything to eat or drink after midnight the night before surgery. For this reason, temporary dehydration may occur. This, along with not eating or only having liquids, means that you are getting less fiber than usual. Both these factors contribute to constipation. 3) Changes in your diet after surgery can also contribute to the problem. Although many people don't have dietary restrictions after operations, being under anesthesia can make you lose your appetite for several hours and maybe even days. Some people can even have nausea or vomiting. Not eating or drinking normally means that you are not getting enough fiber and you can get dehydrated, both leading to constipation. 4) Lying in a bed more than usual--which happens before, during and after surgery--combined with the medications and diet changes, all work together to slow down your colon and make your poop turn to rock.  No one likes to be constipated.  Let's face  it, it's not a pleasant feeling when you don't poop for days, then strain on the toilet to finally pass something large enough to cause damage. An ounce of prevention is worth a pound of cure, so: Assume you will be constipated. Plan and prepare accordingly. Post-surgery is one of those unique situations where the temporary use of laxatives can make a world of difference. Always consult with your doctor, and recognize that if you wait several days after surgery to take a laxative, the constipation might be too severe for these over-the-counter options. It is always important to discuss all medications you plan on taking with your doctor. Ask your doctor if you can start the laxative immediately after surgery. *  Here are go-to post-surgery laxatives: Senna: Senna is an herb that acts as a "stimulant laxative," meaning it increases the activity of the intestine to cause you to have a bowel movement. It comes in many forms, but senna pills are easy to take and are sold over the   counter at almost all pharmacies. Since opioid pain medications slow down the activity of the intestine, it makes sense to take a medication to help reverse that side effect. Long-term use of a stimulant laxative is not a good idea since it can make your colon "lazy" and not function properly; however, temporary use immediately after surgery is acceptable. In general, if you are able to eat a normal diet, taking senna soon after surgery works the best. Senna usually works within hours to produce a bowel movement, but this is less predictable when you are taking different medications after surgery. Try not to wait several days to start taking senna, as often it is too late by then. Just like with all medications or supplements, check with your doctor before starting new treatment.   Magnesium: Magnesium is an important mineral that our body needs. We get magnesium from some foods that we eat, especially foods that are high in fiber such  as broccoli, almonds and whole grains. There are also magnesium-based medications used to treat constipation including milk of magnesia (magnesium hydroxide), magnesium citrate and magnesium oxide. They work by drawing water into the intestine, putting it into the class of "osmotic" laxatives. Magnesium products in low doses appear to be safe, but if taken in very large doses, can lead to problems such as irregular heartbeat, low blood pressure and even death. It can also affect other medications you might be taking, therefore it is important to discuss using magnesium with your physician and pharmacist before initiating therapy. Most over-the-counter magnesium laxatives work very well to help with the constipation related to surgery, but sometimes they work too well and lead to diarrhea. Make sure you are somewhere with easy access to a bathroom, just in case.   Bisacodyl: Bisacodyl (generic name) is sold under brand names such as Dulcolax. Much like senna, it is a "stimulant laxative," meaning it makes your intestines move more quickly to push out the stool. This is another good choice to start taking as soon as your doctor says you can take a laxative after surgery. It comes in pill form and as a suppository, which is a good choice for people who cannot or are not allowed to swallow pills. Studies have shown that it works as a laxative, but like most of these medications, you should use this on a short-term basis only.   Enema: Enemas strike fear in many people, but FEAR NOT! It's nowhere near as big a deal as you may think. An enema is just a way to get some liquid into your rectum by placing a specially designed device through your anus. If you have never done one, it might seem like a painful, unpleasant, uncomfortable, complicated and lengthy procedure. But in reality, it's simple, takes just a few seconds and is highly effective. The small ready-made bottles you buy at the pharmacy are much easier than  the hose/large rubber container type. Those recommended positions illustrated in some instructions are generally not necessary to place the enema. It's very similar to the insertion of a tampon, requiring a slight squat. Some extra lubrication on the enema's tip (or on your anus) will make it a breeze. In certain cases, there is no substitute for a good enema. For example, if someone has not pooped for a few days, the beginning of the poop waiting to come out can become rock hard. Passing that hard stool can lead to much pain and problems like anal fissures. Inserting a little liquid to   break up the rock-hard stool will help make its passage much easier. Enemas come with different liquids. Most come with saline, but there are also mineral oil options. You can also use warm water in the reusable enema containers. They all work. But since saline can sometimes be irritating, so try a mineral oil or water enema instead.  Here are commonly recommended constipation medications that do not work well for post-surgery constipation: Docusate: Docusate (generic name) most commonly referred to as Colace (brand name) is not really a laxative, but is classified as a stool softener. Although this medication is commonly prescribed, it is not recommended for several reasons: 1) there is no good medical evidence that it works 2) even if it has an effect, which is very questionable, it is minimal and cannot combat the intestinal slowing caused by the opioid medications. Skip docusate to save money and space in your pillbox for something more effective.  PEG: Miralax (brand name) is basically a chemical called polyethylene glycol (PEG) and it has gained tremendous popularity as a laxative. This product is an "osmotic laxative" meaning it works by pulling water into the stool, making it softer. This is very similar to the action of natural fiber in foods and supplements. Therefore, the effect seen by this medication is not  immediate, causing a bowel movement in a day or more. Is this medication strong enough to battle the constipation related to having an operation? Maybe for some people not prone to constipation. But for most people, other laxatives are better to prevent constipation after surgery.  

## 2024-02-13 ENCOUNTER — Encounter: Payer: Self-pay | Admitting: Obstetrics and Gynecology

## 2024-02-17 LAB — SURGICAL PATHOLOGY

## 2024-03-25 ENCOUNTER — Other Ambulatory Visit: Payer: Self-pay | Admitting: Medical Genetics

## 2024-03-25 DIAGNOSIS — Z006 Encounter for examination for normal comparison and control in clinical research program: Secondary | ICD-10-CM

## 2024-03-25 NOTE — Progress Notes (Signed)
ge
